# Patient Record
Sex: Female | Born: 1980
Health system: Southern US, Community
[De-identification: ages and names within clinical notes are randomized; demographics above are authoritative.]

## PROBLEM LIST (undated history)

## (undated) ENCOUNTER — Inpatient Hospital Stay (HOSPITAL_COMMUNITY): Payer: Self-pay

## (undated) DIAGNOSIS — B009 Herpesviral infection, unspecified: Secondary | ICD-10-CM

## (undated) DIAGNOSIS — I499 Cardiac arrhythmia, unspecified: Secondary | ICD-10-CM

## (undated) DIAGNOSIS — F419 Anxiety disorder, unspecified: Secondary | ICD-10-CM

## (undated) DIAGNOSIS — D649 Anemia, unspecified: Secondary | ICD-10-CM

## (undated) DIAGNOSIS — R51 Headache: Secondary | ICD-10-CM

## (undated) DIAGNOSIS — D219 Benign neoplasm of connective and other soft tissue, unspecified: Secondary | ICD-10-CM

## (undated) DIAGNOSIS — M1990 Unspecified osteoarthritis, unspecified site: Secondary | ICD-10-CM

## (undated) DIAGNOSIS — F329 Major depressive disorder, single episode, unspecified: Secondary | ICD-10-CM

## (undated) DIAGNOSIS — G43909 Migraine, unspecified, not intractable, without status migrainosus: Secondary | ICD-10-CM

## (undated) DIAGNOSIS — F32A Depression, unspecified: Secondary | ICD-10-CM

## (undated) HISTORY — DX: Benign neoplasm of connective and other soft tissue, unspecified: D21.9

## (undated) HISTORY — DX: Unspecified osteoarthritis, unspecified site: M19.90

## (undated) HISTORY — DX: Depression, unspecified: F32.A

## (undated) HISTORY — DX: Anemia, unspecified: D64.9

## (undated) HISTORY — PX: NO PAST SURGERIES: SHX2092

## (undated) HISTORY — PX: WISDOM TOOTH EXTRACTION: SHX21

## (undated) HISTORY — PX: OTHER SURGICAL HISTORY: SHX169

---
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
## 1999-08-16 ENCOUNTER — Encounter: Admission: RE | Admit: 1999-08-16 | Discharge: 1999-08-16 | Payer: Self-pay | Admitting: Family Medicine

## 1999-12-06 ENCOUNTER — Other Ambulatory Visit: Admission: RE | Admit: 1999-12-06 | Discharge: 1999-12-06 | Payer: Self-pay | Admitting: Obstetrics & Gynecology

## 1999-12-10 ENCOUNTER — Other Ambulatory Visit: Admission: RE | Admit: 1999-12-10 | Discharge: 1999-12-10 | Payer: Self-pay | Admitting: Obstetrics and Gynecology

## 2000-03-12 ENCOUNTER — Inpatient Hospital Stay (HOSPITAL_COMMUNITY): Admission: AD | Admit: 2000-03-12 | Discharge: 2000-03-12 | Payer: Self-pay | Admitting: Obstetrics and Gynecology

## 2000-07-26 ENCOUNTER — Inpatient Hospital Stay (HOSPITAL_COMMUNITY): Admission: AD | Admit: 2000-07-26 | Discharge: 2000-07-28 | Payer: Self-pay | Admitting: Obstetrics & Gynecology

## 2000-07-26 ENCOUNTER — Encounter: Payer: Self-pay | Admitting: Obstetrics & Gynecology

## 2000-08-13 ENCOUNTER — Inpatient Hospital Stay (HOSPITAL_COMMUNITY): Admission: AD | Admit: 2000-08-13 | Discharge: 2000-08-13 | Payer: Self-pay | Admitting: Obstetrics and Gynecology

## 2000-08-20 ENCOUNTER — Observation Stay (HOSPITAL_COMMUNITY): Admission: AD | Admit: 2000-08-20 | Discharge: 2000-08-21 | Payer: Self-pay | Admitting: Obstetrics & Gynecology

## 2000-08-23 ENCOUNTER — Inpatient Hospital Stay (HOSPITAL_COMMUNITY): Admission: AD | Admit: 2000-08-23 | Discharge: 2000-08-23 | Payer: Self-pay | Admitting: Obstetrics and Gynecology

## 2000-08-24 ENCOUNTER — Encounter: Payer: Self-pay | Admitting: Obstetrics and Gynecology

## 2000-09-03 ENCOUNTER — Inpatient Hospital Stay (HOSPITAL_COMMUNITY): Admission: AD | Admit: 2000-09-03 | Discharge: 2000-09-06 | Payer: Self-pay | Admitting: Obstetrics and Gynecology

## 2000-09-10 ENCOUNTER — Inpatient Hospital Stay (HOSPITAL_COMMUNITY): Admission: AD | Admit: 2000-09-10 | Discharge: 2000-09-10 | Payer: Self-pay | Admitting: Obstetrics and Gynecology

## 2000-10-03 ENCOUNTER — Inpatient Hospital Stay (HOSPITAL_COMMUNITY): Admission: AD | Admit: 2000-10-03 | Discharge: 2000-10-05 | Payer: Self-pay | Admitting: Obstetrics & Gynecology

## 2001-02-12 ENCOUNTER — Other Ambulatory Visit: Admission: RE | Admit: 2001-02-12 | Discharge: 2001-02-12 | Payer: Self-pay | Admitting: Obstetrics & Gynecology

## 2001-03-15 ENCOUNTER — Emergency Department (HOSPITAL_COMMUNITY): Admission: EM | Admit: 2001-03-15 | Discharge: 2001-03-15 | Payer: Self-pay | Admitting: Emergency Medicine

## 2001-05-05 ENCOUNTER — Inpatient Hospital Stay (HOSPITAL_COMMUNITY): Admission: AD | Admit: 2001-05-05 | Discharge: 2001-05-05 | Payer: Self-pay | Admitting: Obstetrics and Gynecology

## 2001-05-05 ENCOUNTER — Encounter: Payer: Self-pay | Admitting: Obstetrics and Gynecology

## 2001-07-13 ENCOUNTER — Inpatient Hospital Stay (HOSPITAL_COMMUNITY): Admission: EM | Admit: 2001-07-13 | Discharge: 2001-07-17 | Payer: Self-pay | Admitting: Psychiatry

## 2001-08-06 ENCOUNTER — Inpatient Hospital Stay (HOSPITAL_COMMUNITY): Admission: AD | Admit: 2001-08-06 | Discharge: 2001-08-06 | Payer: Self-pay | Admitting: Obstetrics & Gynecology

## 2001-08-07 ENCOUNTER — Encounter: Admission: RE | Admit: 2001-08-07 | Discharge: 2001-08-07 | Payer: Self-pay | Admitting: *Deleted

## 2001-09-01 ENCOUNTER — Inpatient Hospital Stay (HOSPITAL_COMMUNITY): Admission: AD | Admit: 2001-09-01 | Discharge: 2001-09-03 | Payer: Self-pay | Admitting: *Deleted

## 2001-11-15 ENCOUNTER — Emergency Department (HOSPITAL_COMMUNITY): Admission: EM | Admit: 2001-11-15 | Discharge: 2001-11-15 | Payer: Self-pay | Admitting: Emergency Medicine

## 2001-11-15 ENCOUNTER — Encounter: Payer: Self-pay | Admitting: Emergency Medicine

## 2002-08-22 ENCOUNTER — Other Ambulatory Visit: Admission: RE | Admit: 2002-08-22 | Discharge: 2002-08-22 | Payer: Self-pay | Admitting: Obstetrics and Gynecology

## 2002-08-23 ENCOUNTER — Other Ambulatory Visit: Admission: RE | Admit: 2002-08-23 | Discharge: 2002-08-23 | Payer: Self-pay | Admitting: Obstetrics and Gynecology

## 2002-08-26 ENCOUNTER — Emergency Department (HOSPITAL_COMMUNITY): Admission: EM | Admit: 2002-08-26 | Discharge: 2002-08-27 | Payer: Self-pay | Admitting: *Deleted

## 2002-08-27 ENCOUNTER — Encounter: Payer: Self-pay | Admitting: Emergency Medicine

## 2003-05-25 ENCOUNTER — Emergency Department (HOSPITAL_COMMUNITY): Admission: EM | Admit: 2003-05-25 | Discharge: 2003-05-25 | Payer: Self-pay | Admitting: Emergency Medicine

## 2003-07-11 ENCOUNTER — Emergency Department (HOSPITAL_COMMUNITY): Admission: AD | Admit: 2003-07-11 | Discharge: 2003-07-11 | Payer: Self-pay | Admitting: Family Medicine

## 2003-08-27 ENCOUNTER — Emergency Department (HOSPITAL_COMMUNITY): Admission: EM | Admit: 2003-08-27 | Discharge: 2003-08-27 | Payer: Self-pay | Admitting: Family Medicine

## 2003-12-11 ENCOUNTER — Emergency Department (HOSPITAL_COMMUNITY): Admission: EM | Admit: 2003-12-11 | Discharge: 2003-12-11 | Payer: Self-pay | Admitting: Family Medicine

## 2003-12-16 ENCOUNTER — Other Ambulatory Visit: Admission: RE | Admit: 2003-12-16 | Discharge: 2003-12-16 | Payer: Self-pay | Admitting: Obstetrics and Gynecology

## 2004-04-26 ENCOUNTER — Emergency Department (HOSPITAL_COMMUNITY): Admission: EM | Admit: 2004-04-26 | Discharge: 2004-04-26 | Payer: Self-pay | Admitting: Family Medicine

## 2004-06-10 ENCOUNTER — Emergency Department (HOSPITAL_COMMUNITY): Admission: EM | Admit: 2004-06-10 | Discharge: 2004-06-10 | Payer: Self-pay | Admitting: Family Medicine

## 2004-07-20 ENCOUNTER — Encounter: Admission: RE | Admit: 2004-07-20 | Discharge: 2004-07-20 | Payer: Self-pay | Admitting: Internal Medicine

## 2004-08-17 ENCOUNTER — Emergency Department (HOSPITAL_COMMUNITY): Admission: EM | Admit: 2004-08-17 | Discharge: 2004-08-18 | Payer: Self-pay | Admitting: Emergency Medicine

## 2004-08-17 ENCOUNTER — Emergency Department (HOSPITAL_COMMUNITY): Admission: EM | Admit: 2004-08-17 | Discharge: 2004-08-17 | Payer: Self-pay | Admitting: Family Medicine

## 2005-05-03 ENCOUNTER — Encounter: Admission: RE | Admit: 2005-05-03 | Discharge: 2005-05-03 | Payer: Self-pay | Admitting: Occupational Medicine

## 2005-06-21 ENCOUNTER — Other Ambulatory Visit: Admission: RE | Admit: 2005-06-21 | Discharge: 2005-06-21 | Payer: Self-pay | Admitting: Obstetrics and Gynecology

## 2005-07-31 ENCOUNTER — Emergency Department (HOSPITAL_COMMUNITY): Admission: EM | Admit: 2005-07-31 | Discharge: 2005-07-31 | Payer: Self-pay | Admitting: Family Medicine

## 2006-07-16 ENCOUNTER — Emergency Department (HOSPITAL_COMMUNITY): Admission: EM | Admit: 2006-07-16 | Discharge: 2006-07-16 | Payer: Self-pay | Admitting: Family Medicine

## 2006-11-23 ENCOUNTER — Emergency Department (HOSPITAL_COMMUNITY): Admission: EM | Admit: 2006-11-23 | Discharge: 2006-11-23 | Payer: Self-pay | Admitting: Emergency Medicine

## 2006-12-26 ENCOUNTER — Emergency Department (HOSPITAL_COMMUNITY): Admission: EM | Admit: 2006-12-26 | Discharge: 2006-12-26 | Payer: Self-pay | Admitting: Emergency Medicine

## 2006-12-31 ENCOUNTER — Emergency Department (HOSPITAL_COMMUNITY): Admission: EM | Admit: 2006-12-31 | Discharge: 2006-12-31 | Payer: Self-pay | Admitting: Family Medicine

## 2007-01-01 ENCOUNTER — Emergency Department (HOSPITAL_COMMUNITY): Admission: EM | Admit: 2007-01-01 | Discharge: 2007-01-01 | Payer: Self-pay | Admitting: Emergency Medicine

## 2007-01-08 ENCOUNTER — Emergency Department (HOSPITAL_COMMUNITY): Admission: EM | Admit: 2007-01-08 | Discharge: 2007-01-08 | Payer: Self-pay | Admitting: Emergency Medicine

## 2007-05-23 ENCOUNTER — Emergency Department (HOSPITAL_COMMUNITY): Admission: EM | Admit: 2007-05-23 | Discharge: 2007-05-23 | Payer: Self-pay | Admitting: Emergency Medicine

## 2007-08-03 ENCOUNTER — Emergency Department (HOSPITAL_COMMUNITY): Admission: EM | Admit: 2007-08-03 | Discharge: 2007-08-03 | Payer: Self-pay | Admitting: Emergency Medicine

## 2007-09-07 ENCOUNTER — Emergency Department (HOSPITAL_COMMUNITY): Admission: EM | Admit: 2007-09-07 | Discharge: 2007-09-07 | Payer: Self-pay | Admitting: Emergency Medicine

## 2008-01-05 ENCOUNTER — Emergency Department (HOSPITAL_COMMUNITY): Admission: EM | Admit: 2008-01-05 | Discharge: 2008-01-05 | Payer: Self-pay | Admitting: Emergency Medicine

## 2008-08-09 ENCOUNTER — Emergency Department (HOSPITAL_COMMUNITY): Admission: EM | Admit: 2008-08-09 | Discharge: 2008-08-09 | Payer: Self-pay | Admitting: Family Medicine

## 2008-08-31 ENCOUNTER — Emergency Department (HOSPITAL_COMMUNITY): Admission: EM | Admit: 2008-08-31 | Discharge: 2008-08-31 | Payer: Self-pay | Admitting: Emergency Medicine

## 2008-09-21 ENCOUNTER — Emergency Department (HOSPITAL_COMMUNITY): Admission: EM | Admit: 2008-09-21 | Discharge: 2008-09-21 | Payer: Self-pay | Admitting: Family Medicine

## 2008-11-13 ENCOUNTER — Emergency Department (HOSPITAL_COMMUNITY): Admission: EM | Admit: 2008-11-13 | Discharge: 2008-11-13 | Payer: Self-pay | Admitting: Emergency Medicine

## 2009-03-14 ENCOUNTER — Emergency Department (HOSPITAL_COMMUNITY): Admission: EM | Admit: 2009-03-14 | Discharge: 2009-03-14 | Payer: Self-pay | Admitting: Family Medicine

## 2009-05-23 ENCOUNTER — Ambulatory Visit: Payer: Self-pay | Admitting: Obstetrics and Gynecology

## 2009-05-23 ENCOUNTER — Inpatient Hospital Stay (HOSPITAL_COMMUNITY): Admission: AD | Admit: 2009-05-23 | Discharge: 2009-05-23 | Payer: Self-pay | Admitting: Obstetrics and Gynecology

## 2010-02-12 ENCOUNTER — Emergency Department (HOSPITAL_BASED_OUTPATIENT_CLINIC_OR_DEPARTMENT_OTHER): Admission: EM | Admit: 2010-02-12 | Discharge: 2010-02-12 | Payer: Self-pay | Admitting: Emergency Medicine

## 2010-03-02 ENCOUNTER — Ambulatory Visit: Payer: Self-pay | Admitting: Radiology

## 2010-03-02 ENCOUNTER — Emergency Department (HOSPITAL_BASED_OUTPATIENT_CLINIC_OR_DEPARTMENT_OTHER): Admission: EM | Admit: 2010-03-02 | Discharge: 2010-03-02 | Payer: Self-pay | Admitting: Emergency Medicine

## 2010-05-07 ENCOUNTER — Emergency Department (HOSPITAL_BASED_OUTPATIENT_CLINIC_OR_DEPARTMENT_OTHER)
Admission: EM | Admit: 2010-05-07 | Discharge: 2010-05-07 | Payer: Self-pay | Source: Home / Self Care | Admitting: Emergency Medicine

## 2010-05-17 LAB — URINALYSIS, ROUTINE W REFLEX MICROSCOPIC
Bilirubin Urine: NEGATIVE
Ketones, ur: NEGATIVE mg/dL
Nitrite: NEGATIVE
Protein, ur: NEGATIVE mg/dL
Specific Gravity, Urine: 1.027 (ref 1.005–1.030)
Urine Glucose, Fasting: NEGATIVE mg/dL
Urobilinogen, UA: 1 mg/dL (ref 0.0–1.0)
pH: 7 (ref 5.0–8.0)

## 2010-05-17 LAB — URINE CULTURE
Colony Count: >=100000
Culture  Setup Time: 201201070218

## 2010-05-17 LAB — URINE MICROSCOPIC-ADD ON

## 2010-05-17 LAB — DIFFERENTIAL
Basophils Absolute: 0 10*3/uL (ref 0.0–0.1)
Basophils Relative: 0 % (ref 0–1)
Eosinophils Absolute: 0.5 10*3/uL (ref 0.0–0.7)
Eosinophils Relative: 5 % (ref 0–5)
Lymphocytes Relative: 32 % (ref 12–46)
Lymphs Abs: 3.2 10*3/uL (ref 0.7–4.0)
Monocytes Absolute: 1.1 10*3/uL — ABNORMAL HIGH (ref 0.1–1.0)
Monocytes Relative: 11 % (ref 3–12)
Neutro Abs: 5.1 10*3/uL (ref 1.7–7.7)
Neutrophils Relative %: 52 % (ref 43–77)

## 2010-05-17 LAB — CBC
HCT: 34.2 % — ABNORMAL LOW (ref 36.0–46.0)
Hemoglobin: 11.1 g/dL — ABNORMAL LOW (ref 12.0–15.0)
MCH: 24 pg — ABNORMAL LOW (ref 26.0–34.0)
MCHC: 32.5 g/dL (ref 30.0–36.0)
MCV: 73.9 fL — ABNORMAL LOW (ref 78.0–100.0)
Platelets: 304 10*3/uL (ref 150–400)
RBC: 4.63 MIL/uL (ref 3.87–5.11)
RDW: 15.3 % (ref 11.5–15.5)
WBC: 9.9 10*3/uL (ref 4.0–10.5)

## 2010-05-17 LAB — BASIC METABOLIC PANEL
BUN: 11 mg/dL (ref 6–23)
CO2: 21 mEq/L (ref 19–32)
Calcium: 8.9 mg/dL (ref 8.4–10.5)
Chloride: 110 mEq/L (ref 96–112)
Creatinine, Ser: 0.6 mg/dL (ref 0.4–1.2)
GFR calc Af Amer: >60 mL/min (ref >60)
GFR calc non Af Amer: >60 mL/min (ref >60)
Glucose, Bld: 83 mg/dL (ref 70–99)
Potassium: 4 mEq/L (ref 3.5–5.1)
Sodium: 142 mEq/L (ref 135–145)

## 2010-07-13 LAB — ABO/RH: ABO/RH(D): O POS

## 2010-07-13 LAB — BASIC METABOLIC PANEL
BUN: 11 mg/dL (ref 6–23)
CO2: 20 mEq/L (ref 19–32)
Calcium: 9.7 mg/dL (ref 8.4–10.5)
Chloride: 107 mEq/L (ref 96–112)
Creatinine, Ser: 0.6 mg/dL (ref 0.4–1.2)
GFR calc Af Amer: >60 mL/min (ref >60)
GFR calc non Af Amer: >60 mL/min (ref >60)
Glucose, Bld: 79 mg/dL (ref 70–99)
Potassium: 5.5 mEq/L — ABNORMAL HIGH (ref 3.5–5.1)
Sodium: 142 mEq/L (ref 135–145)

## 2010-07-13 LAB — DIFFERENTIAL
Basophils Absolute: 0.4 10*3/uL — ABNORMAL HIGH (ref 0.0–0.1)
Basophils Relative: 4 % — ABNORMAL HIGH (ref 0–1)
Eosinophils Absolute: 0.4 10*3/uL (ref 0.0–0.7)
Eosinophils Relative: 4 % (ref 0–5)
Lymphocytes Relative: 31 % (ref 12–46)
Lymphs Abs: 2.8 10*3/uL (ref 0.7–4.0)
Monocytes Absolute: 0.6 10*3/uL (ref 0.1–1.0)
Monocytes Relative: 7 % (ref 3–12)
Neutro Abs: 5 10*3/uL (ref 1.7–7.7)
Neutrophils Relative %: 54 % (ref 43–77)

## 2010-07-13 LAB — CBC
HCT: 44 % (ref 36.0–46.0)
Hemoglobin: 14.4 g/dL (ref 12.0–15.0)
MCH: 25.5 pg — ABNORMAL LOW (ref 26.0–34.0)
MCHC: 32.7 g/dL (ref 30.0–36.0)
MCV: 78.2 fL (ref 78.0–100.0)
Platelets: 264 10*3/uL (ref 150–400)
RBC: 5.63 MIL/uL — ABNORMAL HIGH (ref 3.87–5.11)
RDW: 15.5 % (ref 11.5–15.5)
WBC: 9.2 10*3/uL (ref 4.0–10.5)

## 2010-07-13 LAB — HCG, QUANTITATIVE, PREGNANCY: hCG, Beta Chain, Quant, S: 577 m[IU]/mL — ABNORMAL HIGH (ref <5)

## 2010-07-13 LAB — PREGNANCY, URINE: Preg Test, Ur: POSITIVE

## 2010-07-13 LAB — WET PREP, GENITAL

## 2010-07-18 LAB — CBC
HCT: 37.7 % (ref 36.0–46.0)
Hemoglobin: 12.2 g/dL (ref 12.0–15.0)
MCV: 77.9 fL — ABNORMAL LOW (ref 78.0–100.0)
Platelets: 285 10*3/uL (ref 150–400)
WBC: 8.1 10*3/uL (ref 4.0–10.5)

## 2010-08-08 LAB — POCT URINALYSIS DIP (DEVICE)
Bilirubin Urine: NEGATIVE
Glucose, UA: NEGATIVE mg/dL
Ketones, ur: NEGATIVE mg/dL
Specific Gravity, Urine: 1.015 (ref 1.005–1.030)
Urobilinogen, UA: 0.2 mg/dL (ref 0.0–1.0)

## 2010-09-17 NOTE — Discharge Summary (Signed)
[facility]  Patient:    Maria Ho, Maria Ho Visit Number: [id number] MRN: [id number]          Service Type: OBS Location: [facility] Attending Physician:  Mickle Mallory Dictated by:   Jeanice Lim, M.D. Admit Date:  08/06/2001 Discharge Date: 08/06/2001                             Discharge Summary  IDENTIFYING DATA:  This is a 30 year old female single, voluntarily admitted, presenting after an argument with the boyfriend, seven months pregnant, wanting to stab self in the abdomen and kill baby.  MEDICATIONS:  Prenatal vitamins and propoxyphene for headache.  ALLERGIES:  No known drug allergies.  PHYSICAL EXAMINATION:  Unremarkable.  Neurologically nonfocal.  LABORATORY DATA:  Routine admission labs showed mild anemia, hemoglobin 10 and hematocrit 31.2.  Pulse oximetry was 97%.  CMET within normal limits.  Alcohol level negative.  MENTAL STATUS EXAMINATION:  Tall, healthy-appearing female, obviously pregnant and in no acute distress.  Speech within normal limits.  Mood depressed, apathetic and hopeless.  Thought process with passive suicidality, having thoughts to stab self.  No other psychotic symptoms.  Cognitively intact.  ADMISSION DIAGNOSES: Axis I:    1. Major depression, recurrent, severe.            2. Cannabis abuse. Axis II:   None. Axis III:  1. Thirty-week pregnancy.            2. History of premature labor. Axis IV:   Moderate (problems with primary support and problems related to            legal system). Axis V:    28/60.  HOSPITAL COURSE:  The patient was admitted and ordered routine p.r.n. medications.  Monitored for safety.  Ordered individual, group and milieu therapy.  Risk/benefit ratio and alternative treatments regarding medications were discussed.  The patient agreed the severity of depression required treatment and was given Paxil, which was tolerated well.  CONDITION ON DISCHARGE:  Improved.  Her mood was  more stable.  Affect brighter.  Thought process goal directed.  Thought content negative for dangerous ideation or psychotic symptoms.  The patient no longer felt like stabbing herself, felt more hopeful that she could cope with the stress of the pregnancy.  DISCHARGE MEDICATIONS: 1. Citrucel packet. 2. Prenatal vitamins. 3. Paxil CR 25 mg q.a.m. 4. Ambien 10 mg q.h.s. p.r.n. insomnia.  FOLLOW-UP:  The patient was to follow up with her OB/GYN, Dr. Quintella Reichert, Thursday, July 19, 2001 at 2:15 p.m. and Dr. Lourdes Sledge on August 07, 2001 at 2 p.m.  The patient was to take lab results to Dr. Quintella Reichert and Dr. Edward Jolly and have rapid heart rate and thyroid followed up.  She already had an appointment with her OB/GYN at 11:15 a.m. on July 19, 2001.  DISCHARGE DIAGNOSES: Axis I:    1. Major depression, recurrent, severe.            2. Cannabis abuse. Axis II:   None. Axis III:  1. Thirty-week pregnancy.            2. History of premature labor. Axis IV:   Moderate (problems with primary support and problems related to            legal system). Axis V:    Global Assessment of Functioning on discharge 60. Dictated by:   Jeanice Lim, M.D. Attending Physician:  Mickle Mallory  DD:  08/15/01 TD:  08/17/01 Job: [id number]

## 2010-09-17 NOTE — Discharge Summary (Signed)
[facility]  Patient:    Maria Ho, Maria Ho                    MRN: [id number] Adm. Date:  [id number] Disc. Date: [id number] Attending:  Conley Simmonds A                           Discharge Summary  ADMISSION DIAGNOSES:          1. Intrauterine pregnancy at 28+[redacted] weeks                                  gestation.                               2. Preterm labor.  DISCHARGE DIAGNOSES:          1. A 28+[redacted] week gestation.                               2. Preterm labor, resolved.                               3. Undelivered.  SIGNIFICANT HISTORY AND PHYSICAL EXAMINATION:     The patient was a 30 year old gravida 6, para 1-0-4-1 African-American female at 28+[redacted] weeks gestation with uterine contractions every two to three minutes.  Upon presentation to the [facility], the patient was noted to be afebrile and with a heart rate ranging from 105 to 114. The cervix was noted to be 1-2 cm dilated externally, closed internally, with no evidence of effacement and a posterior position of the cervix. The fetal heart rate ranged from 140-160. There were contractions every two to three minutes. The patients white blood cell count was 9.6, and her hemoglobin was 9.8.  The patient was treated with IV fluids, and she continued to contract. A decision was made to proceed at this time with admission for preterm labor. The patient was treated with magnesium sulfate for tocolysis. Unasyn was begun intravenously for antibiotic prophylaxis. The patient did have gonorrhea, Chlamydia, and group B strep cultures performed, and all these returned negative.  The patient had successful resolution of her contractions with the magnesium sulfate, and this was discontinued on July 27, 2000. She was started on Procardia 10 mg p.o. t.i.d. at this time.  The patient did have some headache during her hospitalization which was treated with Fioricet p.r.n.  The patient was found to be  stable and in good condition for discharge on July 28, 2000. Her cervical exam was unchanged.  The patient was discharged to home in good condition.  DISCHARGE INSTRUCTIONS:       Instructions at discharge include a prescription for Procardia 10 mg p.o. t.i.d. The patient will continue on her prenatal vitamin. The patient will have modified bed rest at home and pelvic rest. The patient will follow up in the office later that week. She will call if she experiences problems with contractions, leaking, bleeding, decreased fetal movement, or any other concerns. DD:  09/14/00 TD:  09/15/00 Job: [id number]

## 2010-12-19 ENCOUNTER — Ambulatory Visit (INDEPENDENT_AMBULATORY_CARE_PROVIDER_SITE_OTHER): Payer: Medicaid Other

## 2010-12-19 ENCOUNTER — Inpatient Hospital Stay (INDEPENDENT_AMBULATORY_CARE_PROVIDER_SITE_OTHER)
Admission: RE | Admit: 2010-12-19 | Discharge: 2010-12-19 | Disposition: A | Discharge status: Home / Self Care | Payer: Medicaid Other | Source: Ambulatory Visit | Attending: Family Medicine | Admitting: Family Medicine

## 2010-12-19 DIAGNOSIS — M799 Soft tissue disorder, unspecified: Secondary | ICD-10-CM

## 2011-01-01 ENCOUNTER — Inpatient Hospital Stay (INDEPENDENT_AMBULATORY_CARE_PROVIDER_SITE_OTHER)
Admission: RE | Admit: 2011-01-01 | Discharge: 2011-01-01 | Disposition: A | Discharge status: Home / Self Care | Payer: Medicaid Other | Source: Ambulatory Visit | Attending: Family Medicine | Admitting: Family Medicine

## 2011-01-01 DIAGNOSIS — T7840XA Allergy, unspecified, initial encounter: Secondary | ICD-10-CM

## 2011-01-25 LAB — URINALYSIS, ROUTINE W REFLEX MICROSCOPIC
Glucose, UA: NEGATIVE
Nitrite: NEGATIVE
Specific Gravity, Urine: 1.012
pH: 6.5

## 2011-01-25 LAB — CBC
MCHC: 33.1
MCV: 80.9
RDW: 13.3

## 2011-01-25 LAB — PROTIME-INR
INR: 1
Prothrombin Time: 13.3

## 2011-01-25 LAB — COMPREHENSIVE METABOLIC PANEL
ALT: 15
AST: 17
Calcium: 9.2
Creatinine, Ser: 0.64
GFR calc Af Amer: >60
GFR calc non Af Amer: >60
Glucose, Bld: 93
Sodium: 140
Total Protein: 6.9

## 2011-01-25 LAB — DIFFERENTIAL
Eosinophils Absolute: 0.1
Lymphocytes Relative: 16
Lymphs Abs: 1.6
Monocytes Relative: 6
Neutrophils Relative %: 76

## 2011-01-25 LAB — URINE MICROSCOPIC-ADD ON

## 2011-02-02 LAB — DIFFERENTIAL
Basophils Absolute: 0
Basophils Relative: 1
Eosinophils Absolute: 0.3
Monocytes Relative: 10
Neutro Abs: 2.6
Neutrophils Relative %: 51

## 2011-02-02 LAB — URINALYSIS, ROUTINE W REFLEX MICROSCOPIC
Glucose, UA: NEGATIVE
Ketones, ur: NEGATIVE
Leukocytes, UA: NEGATIVE
Nitrite: NEGATIVE
Protein, ur: NEGATIVE
Urobilinogen, UA: 0.2

## 2011-02-02 LAB — BASIC METABOLIC PANEL
BUN: 8
Calcium: 8.9
Chloride: 111
Creatinine, Ser: 0.59
GFR calc Af Amer: >60

## 2011-02-02 LAB — CBC
MCHC: 32.7
MCV: 82.3
Platelets: 234
RDW: 13.7

## 2011-02-02 LAB — RPR: RPR Ser Ql: NONREACTIVE

## 2011-02-02 LAB — PREGNANCY, URINE: Preg Test, Ur: NEGATIVE

## 2011-02-02 LAB — WET PREP, GENITAL: Yeast Wet Prep HPF POC: NONE SEEN

## 2011-02-02 LAB — GC/CHLAMYDIA PROBE AMP, GENITAL
Chlamydia, DNA Probe: NEGATIVE
GC Probe Amp, Genital: NEGATIVE

## 2011-02-02 LAB — URINE MICROSCOPIC-ADD ON

## 2011-02-08 ENCOUNTER — Emergency Department (HOSPITAL_BASED_OUTPATIENT_CLINIC_OR_DEPARTMENT_OTHER)
Admission: EM | Admit: 2011-02-08 | Discharge: 2011-02-08 | Disposition: A | Discharge status: Home / Self Care | Payer: Medicaid Other | Attending: Emergency Medicine | Admitting: Emergency Medicine

## 2011-02-08 DIAGNOSIS — X500XXA Overexertion from strenuous movement or load, initial encounter: Secondary | ICD-10-CM | POA: Insufficient documentation

## 2011-02-08 DIAGNOSIS — S139XXA Sprain of joints and ligaments of unspecified parts of neck, initial encounter: Secondary | ICD-10-CM | POA: Insufficient documentation

## 2011-02-08 DIAGNOSIS — M549 Dorsalgia, unspecified: Secondary | ICD-10-CM | POA: Insufficient documentation

## 2011-02-08 DIAGNOSIS — G43909 Migraine, unspecified, not intractable, without status migrainosus: Secondary | ICD-10-CM | POA: Insufficient documentation

## 2011-02-08 DIAGNOSIS — S161XXA Strain of muscle, fascia and tendon at neck level, initial encounter: Secondary | ICD-10-CM

## 2011-02-08 HISTORY — DX: Anxiety disorder, unspecified: F41.9

## 2011-02-08 MED ORDER — HYDROMORPHONE HCL 1 MG/ML IJ SOLN
1.0000 mg | Freq: Once | INTRAMUSCULAR | Status: AC
  Administered 2011-02-08: 1 mg via INTRAMUSCULAR
  Filled 2011-02-08: qty 1

## 2011-02-08 MED ORDER — ETODOLAC 500 MG PO TABS: 500.0000 mg | ORAL_TABLET | Freq: Two times a day (BID) | ORAL | Status: DC

## 2011-02-08 MED ORDER — DIAZEPAM 5 MG PO TABS: 5.0000 mg | ORAL_TABLET | Freq: Three times a day (TID) | ORAL | Status: AC | PRN

## 2011-02-08 MED ORDER — ONDANSETRON 4 MG PO TBDP
4.0000 mg | ORAL_TABLET | Freq: Once | ORAL | Status: AC
  Administered 2011-02-08: 4 mg via ORAL
  Filled 2011-02-08: qty 1

## 2011-02-08 NOTE — ED Provider Notes (Addendum)
History     CSN: [id number] Arrival date & time: 02/08/2011  7:24 AM  Chief Complaint  Patient presents with  . Migraine  . Back Pain    (Consider location/radiation/quality/duration/timing/severity/associated sxs/prior treatment) HPI Comments: Patient states he developed neck spasms after moving her heavy backpack yesterday. Patient felt a muscle pull after occurred but initially was not that bad. Symptoms progressively worsened over the last day. Patient also states that she developed somewhat of a migraine headache although that is currently not that bad. Patient has history of migraines and she states that sometimes after taking hydrocodone it will trigger her migraine. She did take her hydrocodone yesterday.  Patient is a 30 y.o. female presenting with migraine and back pain. The history is provided by the patient.  Migraine This is a recurrent problem. The current episode started yesterday. The problem occurs constantly. The problem has not changed since onset.Associated symptoms include headaches. Pertinent negatives include no chest pain, no abdominal pain and no shortness of breath. Exacerbated by: Movement. The symptoms are relieved by nothing. Treatments tried: Hydrocodone tablet. The treatment provided mild relief.  Back Pain  Associated symptoms include headaches. Pertinent negatives include no chest pain, no fever, no numbness, no abdominal pain and no weakness.    Past Medical History  Diagnosis Date  . Anxiety     History reviewed. No pertinent past surgical history.  No family history on file.  History  Substance Use Topics  . Smoking status: Never Smoker   . Smokeless tobacco: Never Used  . Alcohol Use: Yes     wine occasionally    OB History    Grav Para Term Preterm Abortions TAB SAB Ect Mult Living                  Review of Systems  Constitutional: Negative for fever.  Respiratory: Negative for shortness of breath.   Cardiovascular: Negative for  chest pain.  Gastrointestinal: Negative for vomiting, abdominal pain and diarrhea.  Musculoskeletal: Positive for back pain.  Neurological: Positive for headaches. Negative for tremors, seizures, syncope, speech difficulty, weakness and numbness.  All other systems reviewed and are negative.    Allergies  Review of patient's allergies indicates no known allergies.  Home Medications   Current Outpatient Rx  Name Route Sig Dispense Refill  . GABAPENTIN 100 MG PO CAPS Oral Take 100 mg by mouth 3 (three) times daily.      Marland Kitchen PHENTERMINE HCL 37.5 MG PO CAPS Oral Take 37.5 mg by mouth every morning.        BP 129/90  Pulse 93  Temp(Src) 97.9 F (36.6 C) (Oral)  Resp 16  Ht 5\' 10"  (1.778 m)  Wt 195 lb (88.451 kg)  BMI 27.98 kg/m2  SpO2 100%  Physical Exam  Nursing note and vitals reviewed. Constitutional: She is oriented to person, place, and time. She appears well-developed and well-nourished. No distress.  HENT:  Head: Normocephalic and atraumatic.  Right Ear: External ear normal.  Left Ear: External ear normal.  Mouth/Throat: Oropharyngeal exudate present.  Eyes: Conjunctivae are normal. Right eye exhibits no discharge. Left eye exhibits no discharge. No scleral icterus.  Neck: Neck supple. No tracheal deviation present.       Paraspinal muscle tenderness left paracervical region, no meningismus, no erythema or masses  Cardiovascular: Normal rate, regular rhythm and intact distal pulses.   Pulmonary/Chest: Effort normal and breath sounds normal. No stridor. No respiratory distress. She has no wheezes. She has no rales.  Abdominal: Soft. Bowel sounds are normal. She exhibits no distension. There is no tenderness. There is no rebound and no guarding.  Musculoskeletal: She exhibits no edema and no tenderness.  Lymphadenopathy:    She has no cervical adenopathy.  Neurological: She is alert and oriented to person, place, and time. She has normal strength. No cranial nerve deficit  ( no gross defecits noted) or sensory deficit. She exhibits normal muscle tone. She displays no seizure activity. Coordination normal.       5 out of 5 strength bilateral upper jugular extremities, sensation intact in all extremities, no visual field cuts, no left or right sided neglect  Skin: Skin is warm and dry. No rash noted.  Psychiatric: She has a normal mood and affect.    ED Course  Procedures (including critical care time)  Labs Reviewed - No data to display No results found.   1. Cervical strain       MDM  Patient appears to have a muscle strain in her neck region. Her symptoms are not suggestive of meningitis. She does not have any fever. There are no focal neurological abnormalities. Patient does have history of migraines as well but this appears to be a minor component. Patient will be discharged home on muscle relaxants and anti-inflammatory pain medications        Celene Kras, MD 02/08/11 [id number]  Celene Kras, MD 02/08/11 [phone]

## 2011-02-08 NOTE — ED Notes (Signed)
Pt reports headache that started this am.  She reports injuring her upper back and neck yesterday after removing her backpack.

## 2011-02-14 LAB — DIFFERENTIAL
Basophils Absolute: 0
Basophils Relative: 0
Monocytes Relative: 3
Neutro Abs: 8.2 — ABNORMAL HIGH
Neutrophils Relative %: 90 — ABNORMAL HIGH

## 2011-02-14 LAB — POCT I-STAT CREATININE: Operator id: 239701

## 2011-02-14 LAB — POCT URINALYSIS DIP (DEVICE)
Glucose, UA: NEGATIVE
Nitrite: NEGATIVE
Operator id: 239701
Urobilinogen, UA: 0.2

## 2011-02-14 LAB — I-STAT 8, (EC8 V) (CONVERTED LAB)
Acid-base deficit: 2
Hemoglobin: 17.3 — ABNORMAL HIGH
Potassium: 5.4 — ABNORMAL HIGH
Sodium: 137
TCO2: 24

## 2011-02-14 LAB — POCT PREGNANCY, URINE
Operator id: 239701
Preg Test, Ur: NEGATIVE

## 2011-02-14 LAB — CBC
MCHC: 32.6
RBC: 5.58 — ABNORMAL HIGH
RDW: 14.2 — ABNORMAL HIGH

## 2011-02-14 LAB — URINE CULTURE: Colony Count: 10000

## 2011-02-14 LAB — URINALYSIS, ROUTINE W REFLEX MICROSCOPIC
Ketones, ur: >80 — AB
Nitrite: NEGATIVE
Urobilinogen, UA: 0.2

## 2011-04-07 ENCOUNTER — Encounter (HOSPITAL_COMMUNITY): Payer: Self-pay | Admitting: *Deleted

## 2011-04-07 ENCOUNTER — Inpatient Hospital Stay (HOSPITAL_COMMUNITY)
Admission: AD | Admit: 2011-04-07 | Discharge: 2011-04-07 | Disposition: A | Discharge status: Home / Self Care | Payer: Medicaid Other | Source: Ambulatory Visit | Attending: Family Medicine | Admitting: Family Medicine

## 2011-04-07 ENCOUNTER — Inpatient Hospital Stay (HOSPITAL_COMMUNITY): Payer: Medicaid Other

## 2011-04-07 DIAGNOSIS — B9689 Other specified bacterial agents as the cause of diseases classified elsewhere: Secondary | ICD-10-CM

## 2011-04-07 DIAGNOSIS — O209 Hemorrhage in early pregnancy, unspecified: Secondary | ICD-10-CM | POA: Insufficient documentation

## 2011-04-07 DIAGNOSIS — R109 Unspecified abdominal pain: Secondary | ICD-10-CM | POA: Insufficient documentation

## 2011-04-07 HISTORY — DX: Headache: R51

## 2011-04-07 LAB — URINE MICROSCOPIC-ADD ON

## 2011-04-07 LAB — HCG, QUANTITATIVE, PREGNANCY: hCG, Beta Chain, Quant, S: 1301 m[IU]/mL — ABNORMAL HIGH (ref <5)

## 2011-04-07 LAB — URINALYSIS, ROUTINE W REFLEX MICROSCOPIC
Bilirubin Urine: NEGATIVE
Protein, ur: NEGATIVE mg/dL
Specific Gravity, Urine: >1.03 — ABNORMAL HIGH (ref 1.005–1.030)
Urobilinogen, UA: 0.2 mg/dL (ref 0.0–1.0)

## 2011-04-07 LAB — WET PREP, GENITAL: Trich, Wet Prep: NONE SEEN

## 2011-04-07 LAB — CBC
Hemoglobin: 12.1 g/dL (ref 12.0–15.0)
MCHC: 31.5 g/dL (ref 30.0–36.0)
Platelets: 317 10*3/uL (ref 150–400)

## 2011-04-07 MED ORDER — METRONIDAZOLE 500 MG PO TABS: 500.0000 mg | ORAL_TABLET | Freq: Two times a day (BID) | ORAL | Status: AC

## 2011-04-07 NOTE — Progress Notes (Signed)
Pt LMP 02/28/2011, G11 P3, +UPt, having cramping x 2 days.  Denies bleeding.

## 2011-04-07 NOTE — ED Provider Notes (Signed)
History     Chief Complaint  Patient presents with  . Abdominal Cramping   HPI 30 y.o. [id number] at [redacted]w[redacted]d. Cramping started last night, spotting started tonight. No discharge.     Past Medical History  Diagnosis Date  . Anxiety   . Headache     No past surgical history on file.  No family history on file.  History  Substance Use Topics  . Smoking status: Never Smoker   . Smokeless tobacco: Never Used  . Alcohol Use: Yes     wine occasionally    Allergies: No Known Allergies  Prescriptions prior to admission  Medication Sig Dispense Refill  . acetaminophen (TYLENOL) 500 MG tablet Take 500 mg by mouth every 6 (six) hours as needed. Patient took this medication for a headache.       . gabapentin (NEURONTIN) 100 MG capsule Take 100 mg by mouth 3 (three) times daily.        Marland Kitchen ibuprofen (ADVIL,MOTRIN) 800 MG tablet Take 800 mg by mouth every 8 (eight) hours as needed. Patient took this medication for pain.       . phentermine 37.5 MG capsule Take 37.5 mg by mouth every morning.        . Pseudoeph-Doxylamine-DM-APAP (NYQUIL PO) Take 5 mLs by mouth daily as needed. Patient used this medication for sleep.         Review of Systems  Constitutional: Negative.   Respiratory: Negative.   Cardiovascular: Negative.   Gastrointestinal: Positive for abdominal pain. Negative for nausea, vomiting, diarrhea and constipation.  Genitourinary: Negative for dysuria, urgency, frequency, hematuria and flank pain.       Positive for vaginal bleeding  Musculoskeletal: Negative.   Neurological: Negative.   Psychiatric/Behavioral: Negative.    Physical Exam   Blood pressure 134/73, pulse 97, temperature 98.9 F (37.2 C), temperature source Oral, resp. rate 16, height 5\' 10"  (1.778 m), weight 191 lb 3.2 oz (86.728 kg), last menstrual period 02/28/2011.  Physical Exam  Nursing note and vitals reviewed. Constitutional: She is oriented to person, place, and time. She appears well-developed and  well-nourished. No distress.  Cardiovascular: Normal rate.   Respiratory: Effort normal.  GI: Soft. There is no tenderness.  Genitourinary: There is no tenderness or lesion on the right labia. There is no tenderness or lesion on the left labia. Uterus is not enlarged and not tender. Cervix exhibits no motion tenderness and no discharge. Right adnexum displays no tenderness and no fullness. Left adnexum displays no tenderness and no fullness. There is bleeding (scant pink) around the vagina.  Musculoskeletal: Normal range of motion.  Neurological: She is alert and oriented to person, place, and time.    MAU Course  Procedures  Results for orders placed during the hospital encounter of 04/07/11 (from the past 24 hour(s))  URINALYSIS, ROUTINE W REFLEX MICROSCOPIC     Status: Abnormal   Collection Time   04/07/11  8:40 PM      Component Value Range   Color, Urine YELLOW  YELLOW    APPearance CLEAR  CLEAR    Specific Gravity, Urine >1.030 (*) 1.005 - 1.030    pH 6.0  5.0 - 8.0    Glucose, UA NEGATIVE  NEGATIVE (mg/dL)   Hgb urine dipstick TRACE (*) NEGATIVE    Bilirubin Urine NEGATIVE  NEGATIVE    Ketones, ur 15 (*) NEGATIVE (mg/dL)   Protein, ur NEGATIVE  NEGATIVE (mg/dL)   Urobilinogen, UA 0.2  0.0 - 1.0 (mg/dL)  Nitrite NEGATIVE  NEGATIVE    Leukocytes, UA TRACE (*) NEGATIVE   URINE MICROSCOPIC-ADD ON     Status: Abnormal   Collection Time   04/07/11  8:40 PM      Component Value Range   Squamous Epithelial / LPF MANY (*) RARE    WBC, UA 0-2  <3 (WBC/hpf)   RBC / HPF 0-2  <3 (RBC/hpf)   Bacteria, UA FEW (*) RARE    Urine-Other MUCOUS PRESENT    POCT PREGNANCY, URINE     Status: Normal   Collection Time   04/07/11  8:46 PM      Component Value Range   Preg Test, Ur POSITIVE    CBC     Status: Abnormal   Collection Time   04/07/11  9:34 PM      Component Value Range   WBC 9.9  4.0 - 10.5 (K/uL)   RBC 4.85  3.87 - 5.11 (MIL/uL)   Hemoglobin 12.1  12.0 - 15.0 (g/dL)   HCT  86.5  78.4 - 69.6 (%)   MCV 79.2  78.0 - 100.0 (fL)   MCH 24.9 (*) 26.0 - 34.0 (pg)   MCHC 31.5  30.0 - 36.0 (g/dL)   RDW 29.5  28.4 - 13.2 (%)   Platelets 317  150 - 400 (K/uL)  HCG, QUANTITATIVE, PREGNANCY     Status: Abnormal   Collection Time   04/07/11  9:34 PM      Component Value Range   hCG, Beta Chain, Quant, S 1301 (*) <5 (mIU/mL)  WET PREP, GENITAL     Status: Abnormal   Collection Time   04/07/11  9:45 PM      Component Value Range   Yeast, Wet Prep NONE SEEN  NONE SEEN    Trich, Wet Prep NONE SEEN  NONE SEEN    Clue Cells, Wet Prep MODERATE (*) NONE SEEN    WBC, Wet Prep HPF POC MODERATE (*) NONE SEEN     US Ob Comp Less 14 Wks  04/07/2011  *RADIOLOGY REPORT*  Clinical Data: First trimester pregnancy with pain and spotting. LMP 02/28/2011.  Beta HCG level 1301.  OBSTETRIC <14 WK [place] AND TRANSVAGINAL OB US  Technique:  Both transabdominal and transvaginal ultrasound examinations were performed for complete evaluation of the gestation as well as the maternal uterus, adnexal regions, and pelvic cul-de-sac.  Transvaginal technique was performed to assess early pregnancy.  Comparison:   Obstetrical ultrasound 03/02/2010.  Intrauterine gestational sac:   There is a small cyst in the lower uterine segment with a mean sac diameter of 3.3 mm.  If this is a gestational sac, it corresponds with a gestational age of [redacted] weeks 6 days. Yolk sac: None visualized. Embryo: None visualized.  Maternal uterus/adnexae: Again demonstrated is a large heterogeneous uterine fibroid measuring 8.1 x 7.6 x 5.5 cm.  Both maternal ovaries are visualized.  There is a probable small corpus luteal cyst on the right measuring 2.1 cm.  No adnexal mass or free pelvic fluid demonstrated.  IMPRESSION:  1.  Possible small gestational sac in the lower uterine segment with a mean sac diameter 3.3 mm, corresponding with a gestational age of [redacted] weeks 6 days.  No fetal pole is demonstrated.  A fetal pole is not necessarily  visualized this early in pregnancy, and the findings may be due to early gestation. 2.  Ectopic pregnancy is not excluded.  Follow-up beta HCG levels and consideration of follow-up ultrasound in 7-10 days are  recommended to assess fetal viability. 3.  Large uterine fibroid.  Original Report Authenticated By: Gerrianne Scale, M.D.   US Ob Transvaginal  04/07/2011  *RADIOLOGY REPORT*  Clinical Data: First trimester pregnancy with pain and spotting. LMP 02/28/2011.  Beta HCG level 1301.  OBSTETRIC <14 WK [place] AND TRANSVAGINAL OB US  Technique:  Both transabdominal and transvaginal ultrasound examinations were performed for complete evaluation of the gestation as well as the maternal uterus, adnexal regions, and pelvic cul-de-sac.  Transvaginal technique was performed to assess early pregnancy.  Comparison:   Obstetrical ultrasound 03/02/2010.  Intrauterine gestational sac:   There is a small cyst in the lower uterine segment with a mean sac diameter of 3.3 mm.  If this is a gestational sac, it corresponds with a gestational age of [redacted] weeks 6 days. Yolk sac: None visualized. Embryo: None visualized.  Maternal uterus/adnexae: Again demonstrated is a large heterogeneous uterine fibroid measuring 8.1 x 7.6 x 5.5 cm.  Both maternal ovaries are visualized.  There is a probable small corpus luteal cyst on the right measuring 2.1 cm.  No adnexal mass or free pelvic fluid demonstrated.  IMPRESSION:  1.  Possible small gestational sac in the lower uterine segment with a mean sac diameter 3.3 mm, corresponding with a gestational age of [redacted] weeks 6 days.  No fetal pole is demonstrated.  A fetal pole is not necessarily visualized this early in pregnancy, and the findings may be due to early gestation. 2.  Ectopic pregnancy is not excluded.  Follow-up beta HCG levels and consideration of follow-up ultrasound in 7-10 days are recommended to assess fetal viability. 3.  Large uterine fibroid.  Original Report Authenticated By: Gerrianne Scale, M.D.    Assessment and Plan  30 y.o. [id number] at [redacted]w[redacted]d with cramping and bleeding in early pregnancy F/U in 48 hours for repeat quant Precautions rev'd  Taysen Bushart 04/07/2011, 9:06 PM

## 2011-04-07 NOTE — Progress Notes (Signed)
Pt reports cramping since last night. Pt also reports that she had some pink vaginal discharge in MAU restroom.

## 2011-04-08 LAB — GC/CHLAMYDIA PROBE AMP, GENITAL: GC Probe Amp, Genital: NEGATIVE

## 2011-04-08 NOTE — ED Provider Notes (Signed)
Chart reviewed and agree with management and plan.  

## 2011-04-10 ENCOUNTER — Inpatient Hospital Stay (HOSPITAL_COMMUNITY)
Admission: AD | Admit: 2011-04-10 | Discharge: 2011-04-10 | Disposition: A | Discharge status: Home / Self Care | Payer: Medicaid Other | Source: Ambulatory Visit | Attending: Family Medicine | Admitting: Family Medicine

## 2011-04-10 DIAGNOSIS — O209 Hemorrhage in early pregnancy, unspecified: Secondary | ICD-10-CM | POA: Insufficient documentation

## 2011-04-10 LAB — HCG, QUANTITATIVE, PREGNANCY: hCG, Beta Chain, Quant, S: 3488 m[IU]/mL — ABNORMAL HIGH (ref <5)

## 2011-04-10 NOTE — Progress Notes (Signed)
Patient was told to come back for repeat BHCG, no pain no bleeding.

## 2011-04-10 NOTE — ED Provider Notes (Signed)
History   Pt presents today for repeat B-quant secondary to bleeding in preg. She states her bleeding has now stopped and she has no pain or problems.  Chief Complaint  Patient presents with  . Possible Pregnancy   HPI  OB History    Grav Para Term Preterm Abortions TAB SAB Ect Mult Living   11 3 3  7 5 2   2       Past Medical History  Diagnosis Date  . Anxiety   . Headache     No past surgical history on file.  No family history on file.  History  Substance Use Topics  . Smoking status: Never Smoker   . Smokeless tobacco: Never Used  . Alcohol Use: Yes     wine occasionally    Allergies: No Known Allergies  Prescriptions prior to admission  Medication Sig Dispense Refill  . acetaminophen (TYLENOL) 500 MG tablet Take 500 mg by mouth every 6 (six) hours as needed. Patient took this medication for a headache.       . gabapentin (NEURONTIN) 100 MG capsule Take 100 mg by mouth 3 (three) times daily.        . metroNIDAZOLE (FLAGYL) 500 MG tablet Take 1 tablet (500 mg total) by mouth 2 (two) times daily.  14 tablet  0  . Pseudoeph-Doxylamine-DM-APAP (NYQUIL PO) Take 5 mLs by mouth daily as needed. Patient used this medication for sleep.         Review of Systems  Constitutional: Negative for fever.  Cardiovascular: Negative for chest pain.  Gastrointestinal: Negative for nausea, vomiting, abdominal pain, diarrhea and constipation.  Genitourinary: Negative for dysuria, urgency, frequency and hematuria.  Neurological: Negative for dizziness and headaches.  Psychiatric/Behavioral: Negative for depression and suicidal ideas.   Physical Exam   Blood pressure 123/77, pulse 94, temperature 99.4 F (37.4 C), temperature source Oral, resp. rate 16, last menstrual period 02/28/2011.  Physical Exam  Nursing note and vitals reviewed. Constitutional: She is oriented to person, place, and time. She appears well-developed and well-nourished. No distress.  HENT:  Head:  Normocephalic and atraumatic.  Eyes: EOM are normal. Pupils are equal, round, and reactive to light.  GI: Soft. She exhibits no distension. There is no tenderness. There is no rebound and no guarding.  Neurological: She is alert and oriented to person, place, and time.  Skin: Skin is warm and dry. She is not diaphoretic.  Psychiatric: She has a normal mood and affect. Her behavior is normal. Judgment and thought content normal.    MAU Course  Procedures  Results for orders placed during the hospital encounter of 04/10/11 (from the past 24 hour(s))  HCG, QUANTITATIVE, PREGNANCY     Status: Abnormal   Collection Time   04/10/11  2:58 PM      Component Value Range   hCG, Beta Chain, Quant, S 3488 (*) <5 (mIU/mL)     Assessment and Plan  Vag bleeding in preg: pt has had appropriate rise in B-hcg. She will return on 04/17/11 for repeat ultrasound. Discussed diet, activity, risks, and precautions.  Clinton Gallant. Everet Flagg III, DrHSc, MPAS, PA-C  04/10/2011, 3:42 PM   Henrietta Hoover, PA 04/10/11 1545

## 2011-04-11 NOTE — ED Provider Notes (Signed)
Chart reviewed and agree with management and plan.  

## 2011-04-17 ENCOUNTER — Encounter (HOSPITAL_COMMUNITY): Payer: Self-pay

## 2011-04-17 ENCOUNTER — Inpatient Hospital Stay (HOSPITAL_COMMUNITY)
Admission: AD | Admit: 2011-04-17 | Discharge: 2011-04-17 | Disposition: A | Discharge status: Home / Self Care | Payer: Medicaid Other | Source: Ambulatory Visit | Attending: Family Medicine | Admitting: Family Medicine

## 2011-04-17 ENCOUNTER — Inpatient Hospital Stay (HOSPITAL_COMMUNITY): Payer: Medicaid Other

## 2011-04-17 DIAGNOSIS — O209 Hemorrhage in early pregnancy, unspecified: Secondary | ICD-10-CM

## 2011-04-17 MED ORDER — CONCEPT OB 130-92.4-1 MG PO CAPS: 1.0000 | ORAL_CAPSULE | Freq: Every day | ORAL | Status: DC

## 2011-04-17 NOTE — ED Provider Notes (Signed)
History   Chief Complaint:  Vaginal Bleeding   Maria Ho is  30 y.o. [id number] at [redacted]w[redacted]d by LMP. Patient's last menstrual period was 02/28/2011.Marland Kitchen Patient is here for follow up ultrasound after being seen in MAU for spotting and cramping 04/07/11 and 04/10/11. She had appropriately rising quants and US showed a possible 4.6 week GS on 04/07/11.    Since her last visit, the patient is without new complaint.   The patient reports bleeding as  Light pink.    General ROS:  negative  Her previous Quantitative HCG values are:  Results for Maria Ho (MRN [id number]) as of 04/21/2011 20:18  Ref. Range 04/07/2011 21:34 04/10/2011 14:58  hCG, Beta Chain, Quant, S Latest Range: <5 mIU/mL 1301 (H) 3488 (H)   O POS   Physical Exam   Blood pressure 115/71, pulse 95, temperature 98.9 F (37.2 C), temperature source Oral, resp. rate 16, last menstrual period 02/28/2011.  Focused Gynecological Exam: normal external genitalia, vulva, vagina, cervix, uterus and adnexa except for scant pink odorless discharge  Labs: No results found for this or any previous visit (from the past 24 hour(s)).  Ultrasound Studies:     Assessment: [redacted]w[redacted]d weeks gestation  Plan: Start [facility] Rx PNV  [place], [place] 04/17/2011, 10:39 AM

## 2011-04-17 NOTE — Progress Notes (Signed)
Had a couple of episodes of pink discharge on tissue this past week none today, no pain, here for f/u ultrasound.

## 2011-04-21 ENCOUNTER — Encounter (HOSPITAL_COMMUNITY): Payer: Self-pay | Admitting: Advanced Practice Midwife

## 2011-04-26 NOTE — ED Provider Notes (Signed)
Chart reviewed and agree with management and plan.  

## 2011-04-29 ENCOUNTER — Emergency Department (HOSPITAL_COMMUNITY)
Admission: EM | Admit: 2011-04-29 | Discharge: 2011-04-29 | Disposition: A | Discharge status: Home / Self Care | Payer: Medicaid Other | Source: Home / Self Care | Attending: Emergency Medicine | Admitting: Emergency Medicine

## 2011-04-29 ENCOUNTER — Encounter (HOSPITAL_COMMUNITY): Payer: Self-pay

## 2011-04-29 DIAGNOSIS — J039 Acute tonsillitis, unspecified: Secondary | ICD-10-CM

## 2011-04-29 LAB — POCT RAPID STREP A: Streptococcus, Group A Screen (Direct): NEGATIVE

## 2011-04-29 MED ORDER — FLUCONAZOLE 200 MG PO TABS: 200.0000 mg | ORAL_TABLET | Freq: Every day | ORAL | Status: AC

## 2011-04-29 MED ORDER — AMOXICILLIN 250 MG PO CAPS: 250.0000 mg | ORAL_CAPSULE | Freq: Three times a day (TID) | ORAL | Status: AC

## 2011-04-29 NOTE — ED Provider Notes (Signed)
History     CSN: [id number]  Arrival date & time 04/29/11  1933   First MD Initiated Contact with Patient 04/29/11 1945      Chief Complaint  Patient presents with  . Sore Throat    (Consider location/radiation/quality/duration/timing/severity/associated sxs/prior treatment) HPI Comments: Maria Ho has had a two-day history of a severe sore throat, pain with swallowing, swollen, tender glands, malaise, and fatigue. Her son had strep last week. She also has some postnasal drip. She is [redacted] weeks pregnant and so far has had no problems. She has had strep before.  Patient is a 30 y.o. female presenting with pharyngitis.  Sore Throat Pertinent negatives include no abdominal pain and no shortness of breath.    Past Medical History  Diagnosis Date  . Anxiety   . Headache     History reviewed. No pertinent past surgical history.  History reviewed. No pertinent family history.  History  Substance Use Topics  . Smoking status: Never Smoker   . Smokeless tobacco: Never Used  . Alcohol Use: No     wine occasionally    OB History    Grav Para Term Preterm Abortions TAB SAB Ect Mult Living   11 3 3  7 5 2   2       Review of Systems  Constitutional: Negative for fever, chills and fatigue.  HENT: Positive for sore throat and trouble swallowing. Negative for ear pain, congestion, rhinorrhea, sneezing, neck stiffness, voice change and postnasal drip.   Eyes: Negative for pain, discharge and redness.  Respiratory: Negative for cough, chest tightness, shortness of breath and wheezing.   Gastrointestinal: Negative for nausea, vomiting, abdominal pain and diarrhea.  Skin: Negative for rash.    Allergies  Review of patient's allergies indicates no known allergies.  Home Medications   Current Outpatient Rx  Name Route Sig Dispense Refill  . CONCEPT OB 130-92.4-1 MG PO CAPS Oral Take 1 tablet by mouth daily. 30 capsule 12  . ACETAMINOPHEN 500 MG PO TABS Oral Take 500 mg by mouth  every 6 (six) hours as needed. Patient took this medication for a headache.     . AMOXICILLIN 250 MG PO CAPS Oral Take 1 capsule (250 mg total) by mouth 3 (three) times daily. 30 capsule 0  . FLUCONAZOLE 200 MG PO TABS Oral Take 1 tablet (200 mg total) by mouth daily. 7 tablet 0    BP 127/78  Pulse 105  Temp(Src) 98.9 F (37.2 C) (Oral)  Resp 16  SpO2 98%  LMP 02/28/2011  Physical Exam  Nursing note and vitals reviewed. Constitutional: She appears well-developed and well-nourished. No distress.  HENT:  Head: Normocephalic and atraumatic.  Right Ear: External ear normal.  Left Ear: External ear normal.  Nose: Nose normal.  Mouth/Throat: Oropharynx is clear and moist. No oropharyngeal exudate (tonsils were enlarged and red without exudate).  Eyes: Conjunctivae and EOM are normal. Pupils are equal, round, and reactive to light. Right eye exhibits no discharge. Left eye exhibits no discharge.  Neck: Normal range of motion. Neck supple.  Cardiovascular: Normal rate, regular rhythm and normal heart sounds.   Pulmonary/Chest: Effort normal and breath sounds normal. No stridor. No respiratory distress. She has no wheezes. She has no rales. She exhibits no tenderness.  Lymphadenopathy:    She has no cervical adenopathy.  Skin: Skin is warm and dry. No rash noted. She is not diaphoretic.    ED Course  Procedures (including critical care time)  Results for orders placed  during the hospital encounter of 04/29/11  POCT RAPID STREP A ([facility] ONLY)      Component Value Range   Streptococcus, Group A Screen (Direct) NEGATIVE  NEGATIVE       Labs Reviewed  POCT RAPID STREP A ([facility] ONLY)   No results found.   1. Tonsillitis       MDM  Her rapid strep was negative, but she's been exposed and has many symptoms of strep throat. That she's pregnant, I am going to go ahead and treat her with amoxicillin. She states she frequently gets yeast infections, but could not  tolerate yeast creams. I did give her prescription for Diflucan, but told her to only get it filled if she needed it. Diflucan is category C., she understands this and accepts the likelihood of a possible risk.        Roque Lias, MD 04/29/11 [phone]

## 2011-04-29 NOTE — ED Notes (Signed)
C/o sore throat for 2 days.  States her son has had strep throat.

## 2011-05-03 NOTE — L&D Delivery Note (Signed)
Delivery Note At 6:06 AM a healthy female was delivered via NSVD (Presentation: LOA).  APGAR: 8, 9; weight pending.   Placenta status: delivered spontaneously .  Cord:  with the following complications: none .   Anesthesia:  epidural Episiotomy: none Lacerations: n/a Suture Repair: n/a Est. Blood Loss (mL): 350cc Fundus firm after delivery with pitocin and massage, bladder emptied--50 cc.  Mom to postpartum.  Baby to stay with mom in room.  Oliver Pila 12/07/2011, 6:24 AM

## 2011-05-13 LAB — OB RESULTS CONSOLE RPR: RPR: NONREACTIVE

## 2011-05-31 ENCOUNTER — Encounter (HOSPITAL_BASED_OUTPATIENT_CLINIC_OR_DEPARTMENT_OTHER): Payer: Self-pay | Admitting: *Deleted

## 2011-05-31 ENCOUNTER — Emergency Department (HOSPITAL_BASED_OUTPATIENT_CLINIC_OR_DEPARTMENT_OTHER)
Admission: EM | Admit: 2011-05-31 | Discharge: 2011-05-31 | Disposition: A | Discharge status: Home / Self Care | Payer: Medicaid Other | Attending: Emergency Medicine | Admitting: Emergency Medicine

## 2011-05-31 DIAGNOSIS — R110 Nausea: Secondary | ICD-10-CM | POA: Insufficient documentation

## 2011-05-31 DIAGNOSIS — R51 Headache: Secondary | ICD-10-CM

## 2011-05-31 MED ORDER — DIPHENHYDRAMINE HCL 25 MG PO CAPS
25.0000 mg | ORAL_CAPSULE | Freq: Once | ORAL | Status: AC
  Administered 2011-05-31: 25 mg via ORAL
  Filled 2011-05-31 (×2): qty 1

## 2011-05-31 MED ORDER — METOCLOPRAMIDE HCL 5 MG/ML IJ SOLN
10.0000 mg | Freq: Once | INTRAMUSCULAR | Status: AC
  Administered 2011-05-31: 10 mg via INTRAMUSCULAR
  Filled 2011-05-31: qty 2

## 2011-05-31 NOTE — ED Provider Notes (Signed)
Medical screening examination/treatment/procedure(s) were performed by non-physician practitioner and as supervising physician I was immediately available for consultation/collaboration.   Jamiyla Ishee A. Patrica Duel, MD 05/31/11 2340

## 2011-05-31 NOTE — ED Notes (Signed)
Pt amb to triage with quick steady gait , reports her typical migraine ha onset this am with nausea and photophobia.

## 2011-05-31 NOTE — ED Provider Notes (Signed)
History     CSN: [id number]  Arrival date & time 05/31/11  Rickey Primus   First MD Initiated Contact with Patient 05/31/11 1835      Chief Complaint  Patient presents with  . Migraine    (Consider location/radiation/quality/duration/timing/severity/associated sxs/prior treatment) Patient is a 31 y.o. female presenting with migraine. The history is provided by the patient. No language interpreter was used.  Migraine This is a new problem. The current episode started today. The problem occurs constantly. The problem has been gradually worsening. Associated symptoms include nausea. Pertinent negatives include no fever. The symptoms are aggravated by nothing. She has tried acetaminophen for the symptoms. The treatment provided no relief.   Pt complains of a migrane headache. Pt is [redacted] weeks pregnant Past Medical History  Diagnosis Date  . Anxiety   . Headache     History reviewed. No pertinent past surgical history.  No family history on file.  History  Substance Use Topics  . Smoking status: Never Smoker   . Smokeless tobacco: Never Used  . Alcohol Use: No     wine occasionally    OB History    Grav Para Term Preterm Abortions TAB SAB Ect Mult Living   11 3 3  7 5 2   2       Review of Systems  Constitutional: Negative for fever.  Gastrointestinal: Positive for nausea.  All other systems reviewed and are negative.    Allergies  Review of patient's allergies indicates no known allergies.  Home Medications   Current Outpatient Rx  Name Route Sig Dispense Refill  . ACETAMINOPHEN 500 MG PO TABS Oral Take 500 mg by mouth every 6 (six) hours as needed. Patient took this medication for a headache.     . CONCEPT OB 130-92.4-1 MG PO CAPS Oral Take 1 tablet by mouth daily. 30 capsule 12    BP 118/85  Pulse 88  Temp(Src) 99.1 F (37.3 C) (Oral)  Resp 18  Ht 5\' 10"  (1.778 m)  Wt 204 lb (92.534 kg)  BMI 29.27 kg/m2  SpO2 100%  LMP 02/28/2011  Physical Exam  Nursing  note and vitals reviewed. Constitutional: She appears well-developed and well-nourished.  HENT:  Head: Normocephalic.  Right Ear: External ear normal.  Mouth/Throat: Oropharynx is clear and moist.  Eyes: Pupils are equal, round, and reactive to light.  Neck: Normal range of motion.  Cardiovascular: Normal rate.   Pulmonary/Chest: Effort normal.  Abdominal: Soft.  Musculoskeletal: Normal range of motion.  Neurological: She is alert.  Skin: Skin is warm.  Psychiatric: She has a normal mood and affect.    ED Course  Procedures (including critical care time)  Labs Reviewed - No data to display No results found.   No diagnosis found.    MDM  Pt given reglan IM.         Langston Masker, [place] 05/31/11 1913

## 2011-05-31 NOTE — ED Notes (Signed)
Pt states she is [redacted] weeks pregnant, denies any bleeding, cramping, spotting or other c/o.

## 2011-06-14 ENCOUNTER — Institutional Professional Consult (permissible substitution): Payer: Medicaid Other | Admitting: Cardiology

## 2011-06-15 ENCOUNTER — Ambulatory Visit (INDEPENDENT_AMBULATORY_CARE_PROVIDER_SITE_OTHER): Payer: Medicaid Other | Admitting: Cardiology

## 2011-06-15 ENCOUNTER — Encounter: Payer: Self-pay | Admitting: Cardiology

## 2011-06-15 VITALS — BP 125/80 | HR 83 | Ht 70.0 in | Wt 215.0 lb

## 2011-06-15 DIAGNOSIS — R002 Palpitations: Secondary | ICD-10-CM | POA: Insufficient documentation

## 2011-06-15 LAB — BASIC METABOLIC PANEL
BUN: 10 mg/dL (ref 6–23)
Calcium: 8.9 mg/dL (ref 8.4–10.5)
GFR: 192.64 mL/min (ref >60.00)
Potassium: 3.8 mEq/L (ref 3.5–5.1)

## 2011-06-15 LAB — MAGNESIUM: Magnesium: 1.8 mg/dL (ref 1.5–2.5)

## 2011-06-15 NOTE — Patient Instructions (Signed)
Please have blood work today (BMP, TSH, MG)  The current medical regimen is effective;  continue present plan and medications.  Your physician has requested that you have an echocardiogram. Echocardiography is a painless test that uses sound waves to create images of your heart. It provides your doctor with information about the size and shape of your heart and how well your heart's chambers and valves are working. This procedure takes approximately one hour. There are no restrictions for this procedure.  Your physician has recommended that you wear a holter monitor for 24 hours. Holter monitors are medical devices that record the heart's electrical activity. Doctors most often use these monitors to diagnose arrhythmias. Arrhythmias are problems with the speed or rhythm of the heartbeat. The monitor is a small, portable device. You can wear one while you do your normal daily activities. This is usually used to diagnose what is causing palpitations/syncope (passing out).  Follow up will be based on these results

## 2011-06-15 NOTE — Progress Notes (Signed)
   HPI The patient presents for evaluation of palpitations. She is 4 months pregnant. She says that since 2005 or thereabouts she has been having palpitations. She notices them daily. She notices them particularly at night. Can feel a rapid beat or skipped beat. She does not have presyncope or syncope associated with these. She does try to avoid caffeine for the most part but doesn't notice an association. She does not describe chest discomfort, neck or arm discomfort. She does have some shortness of breath which she associates with being pregnant. This is with activity such as climbing stairs. She's not describing PND or orthopnea. She has never had any cardiac workup. She was referred by her GYN for further evaluation.  No Known Allergies  Current Outpatient Prescriptions  Medication Sig Dispense Refill  . acetaminophen (TYLENOL) 500 MG tablet Take 500 mg by mouth every 6 (six) hours as needed. Patient took this medication for a headache.       . Aspirin-Acetaminophen-Caffeine (GOODYS EXTRA STRENGTH PO) Take by mouth as needed.      Burnis Medin w/o A Vit-FeFum-FePo-FA (CONCEPT OB) 130-92.4-1 MG CAPS Take 1 tablet by mouth daily.  30 capsule  12    Past Medical History  Diagnosis Date  . Anxiety   . Headache     No past surgical history on file.  No family history on file.  History   Social History  . Marital Status: Single    Spouse Name: N/A    Number of Children: N/A  . Years of Education: N/A   Occupational History  . Not on file.   Social History Main Topics  . Smoking status: Never Smoker   . Smokeless tobacco: Never Used  . Alcohol Use: No     wine occasionally  . Drug Use: No  . Sexually Active: Yes   Other Topics Concern  . Not on file   Social History Narrative  . No narrative on file    ROS:  As stated in the HPI and negative for all other systems.  PHYSICAL EXAM BP 125/80  Pulse 83  Ht 5\' 10"  (1.778 m)  Wt 215 lb (97.523 kg)  BMI 30.85 kg/m2  LMP  02/28/2011 GENERAL:  Well appearing HEENT:  Pupils equal round and reactive, fundi not visualized, oral mucosa unremarkable NECK:  No jugular venous distention, waveform within normal limits, carotid upstroke brisk and symmetric, no bruits, no thyromegaly LYMPHATICS:  No cervical, inguinal adenopathy LUNGS:  Clear to auscultation bilaterally BACK:  No CVA tenderness CHEST:  Unremarkable HEART:  PMI not displaced or sustained,S1 and S2 within normal limits, no S3, no S4, no clicks, no rubs, no murmurs ABD:  Flat, positive bowel sounds normal in frequency in pitch, no bruits, no rebound, no guarding, no midline pulsatile mass, no hepatomegaly, no splenomegaly, gravid EXT:  2 plus pulses throughout, no edema, no cyanosis no clubbing SKIN:  No rashes no nodules NEURO:  Cranial nerves II through XII grossly intact, motor grossly intact throughout PSYCH:  Cognitively intact, oriented to person place and time  EKG:  Sinus rhythm, rate 83, axis within normal limits, intervals within normal limits, no acute ST-T wave changes. 06/15/2011    ASSESSMENT AND PLAN

## 2011-06-15 NOTE — Assessment & Plan Note (Signed)
To further evaluate these the patient will have a TSH, BMET and magnesium. I will check a 24-hour Holter monitor and an echocardiogram. It is unlikely that further treatment or therapy will be necessary but I will defer this decision until the results of these tests. We did discuss caffeine as possible exacerbating factor.

## 2011-06-28 ENCOUNTER — Encounter (INDEPENDENT_AMBULATORY_CARE_PROVIDER_SITE_OTHER): Payer: Medicaid Other

## 2011-06-28 ENCOUNTER — Other Ambulatory Visit: Payer: Self-pay

## 2011-06-28 ENCOUNTER — Ambulatory Visit (HOSPITAL_COMMUNITY): Payer: Medicaid Other | Attending: Cardiovascular Disease

## 2011-06-28 DIAGNOSIS — R0989 Other specified symptoms and signs involving the circulatory and respiratory systems: Secondary | ICD-10-CM | POA: Insufficient documentation

## 2011-06-28 DIAGNOSIS — R0609 Other forms of dyspnea: Secondary | ICD-10-CM | POA: Insufficient documentation

## 2011-06-28 DIAGNOSIS — I059 Rheumatic mitral valve disease, unspecified: Secondary | ICD-10-CM | POA: Insufficient documentation

## 2011-06-28 DIAGNOSIS — R002 Palpitations: Secondary | ICD-10-CM

## 2011-06-28 DIAGNOSIS — I079 Rheumatic tricuspid valve disease, unspecified: Secondary | ICD-10-CM | POA: Insufficient documentation

## 2011-07-22 ENCOUNTER — Encounter (HOSPITAL_COMMUNITY): Payer: Self-pay | Admitting: Obstetrics and Gynecology

## 2011-07-22 ENCOUNTER — Inpatient Hospital Stay (HOSPITAL_COMMUNITY)
Admission: AD | Admit: 2011-07-22 | Discharge: 2011-07-23 | Disposition: A | Discharge status: Home / Self Care | Payer: Medicaid Other | Attending: Obstetrics and Gynecology | Admitting: Obstetrics and Gynecology

## 2011-07-22 DIAGNOSIS — R109 Unspecified abdominal pain: Secondary | ICD-10-CM

## 2011-07-22 DIAGNOSIS — O9989 Other specified diseases and conditions complicating pregnancy, childbirth and the puerperium: Secondary | ICD-10-CM

## 2011-07-22 DIAGNOSIS — N949 Unspecified condition associated with female genital organs and menstrual cycle: Secondary | ICD-10-CM

## 2011-07-22 DIAGNOSIS — O99891 Other specified diseases and conditions complicating pregnancy: Secondary | ICD-10-CM | POA: Insufficient documentation
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
## 2011-07-22 NOTE — MAU Note (Signed)
"  I first started noticing tightening in my lower abd around 1800. They became painful about 2200.  I called my doctor and she told me to take some Ibuprofen to relax my uterus.  I took Ibuprofen 800 mg and it helped for a little while, but that was over fast.  I was having pain that radiated down my LT thigh at first, but now it's radiating down both my thighs. No leaking or bleeding.  (+) FM felt already."

## 2011-07-23 LAB — URINALYSIS, ROUTINE W REFLEX MICROSCOPIC
Bilirubin Urine: NEGATIVE
Ketones, ur: 15 mg/dL — AB
Nitrite: NEGATIVE
Protein, ur: NEGATIVE mg/dL
Specific Gravity, Urine: 1.02 (ref 1.005–1.030)
Urobilinogen, UA: 0.2 mg/dL (ref 0.0–1.0)

## 2011-07-23 LAB — URINE MICROSCOPIC-ADD ON

## 2011-07-23 NOTE — MAU Provider Note (Signed)
Chief Complaint:  Contractions    First Provider Initiated Contact with Patient 07/23/11 0002      Maria Ho is  31 y.o. [id number].  Patient's last menstrual period was 02/28/2011..  [redacted]w[redacted]d   She presents complaining of Contractions . Onset is described as intermittent and has been present for  Several  hours. Reports speaking with the on call MD who instructed her to take ibuprofen and lay down. States that tightening stopped for a couple hours then returned as sharp, shooting pain in lower abd that radiates down legs. Reports + FM, denies bleeding, LOF, vaginal discharge, dysuria, or back pain.  Obstetrical/Gynecological History: OB History    Grav Para Term Preterm Abortions TAB SAB Ect Mult Living   11 3 3  7 5 2   3       Past Medical History: Past Medical History  Diagnosis Date  . Anxiety     PTSD  . Headache     Migraines    Past Surgical History: Past Surgical History  Procedure Date  . None     Family History: Family History  Problem Relation Age of Onset  . Post-traumatic stress disorder Father   . Alcohol abuse Father   . Drug abuse Father   . Diabetes Maternal Grandmother   . Cancer Paternal Grandmother     Great-Grandparent  . Cancer Paternal Grandfather     Great-Gradnparent    Social History: History  Substance Use Topics  . Smoking status: Never Smoker   . Smokeless tobacco: Never Used  . Alcohol Use: No     wine occasionally- last was in Novemeber 2012    Allergies: No Known Allergies  Prescriptions prior to admission  Medication Sig Dispense Refill  . acetaminophen (TYLENOL) 500 MG tablet Take 500 mg by mouth every 6 (six) hours as needed. For headache.      . Aspirin-Acetaminophen-Caffeine (GOODYS EXTRA STRENGTH PO) Take 1 each by mouth as needed. For headache      . diphenhydrAMINE (BENADRYL) 25 mg capsule Take 25 mg by mouth every 6 (six) hours as needed. For sleep      . ibuprofen (ADVIL,MOTRIN) 200 MG tablet Take 800 mg by  mouth every 6 (six) hours as needed. For pain or headache      . Prenat w/o A Vit-FeFum-FePo-FA (CONCEPT OB) 130-92.4-1 MG CAPS Take 1 tablet by mouth daily.  30 capsule  12    Review of Systems - Negative except what has been reviewed in HPI  Physical Exam   Blood pressure 128/80, pulse 96, temperature 98.8 F (37.1 C), temperature source Oral, resp. rate 20, height 5\' 9"  (1.753 m), weight 225 lb (102.059 kg), last menstrual period 02/28/2011.  General: General appearance - alert, well appearing, and in no distress, oriented to person, place, and time and overweight Mental status - alert, oriented to person, place, and time, normal mood, behavior, speech, dress, motor activity, and thought processes, affect appropriate to mood Abdomen - soft, nontender, nondistended, no masses or organomegaly Focused Gynecological Exam: ext os 1cm/int os closed/long/thick/firm/post   Labs: UA: 15 ketones, Trace hbg   MD Consult: Discussed with Dr. Senaida Ores. 1:02 AM   Assessment: [redacted]w[redacted]d Abd Pain Probable RLP  Plan: Discharge home Comfort measures reviewed FU in office as scheduled  Colburn Asper E. 07/23/2011,12:58 AM

## 2011-07-23 NOTE — Discharge Instructions (Signed)
Abdominal Pain During Pregnancy Abdominal discomfort is common in pregnancy. Most of the time, it does not cause harm. There are many causes of abdominal pain. Some causes are more serious than others. Some of the causes of abdominal pain in pregnancy are easily diagnosed. Occasionally, the diagnosis takes time to understand. Other times, the cause is not determined. Abdominal pain can be a sign that something is very wrong with the pregnancy, or the pain may have nothing to do with the pregnancy at all. For this reason, always tell your caregiver if you have any abdominal discomfort. CAUSES Common and harmless causes of abdominal pain include:  Constipation.   Excess gas and bloating.   Round ligament pain. This is pain that is felt in the folds of the groin.   The position the baby or placenta is in.   Baby kicks.   Braxton-Hicks contractions. These are mild contractions that do not cause cervical dilation.  Serious causes of abdominal pain include:  Ectopic pregnancy. This happens when a fertilized egg implants outside of the uterus.   Miscarriage.   Preterm labor. This is when labor starts at less than 37 weeks of pregnancy.   Placental abruption. This is when the placenta partially or completely separates from the uterus.   Preeclampsia. This is often associated with high blood pressure and has been referred to as "toxemia in pregnancy."   Uterine or amniotic fluid infections.  Causes unrelated to pregnancy include:  Urinary tract infection.   Gallbladder stones or inflammation.   Hepatitis or other liver illness.   Intestinal problems, stomach flu, food poisoning, or ulcer.   Appendicitis.   Kidney (renal) stones.   Kidney infection (pylonephritis).  HOME CARE INSTRUCTIONS  For mild pain:  Do not have sexual intercourse or put anything in your vagina until your symptoms go away completely.   Get plenty of rest until your pain improves. If your pain does not  improve in 1 hour, call your caregiver.   Drink clear fluids if you feel nauseous. Avoid solid food as long as you are uncomfortable or nauseous.   Only take medicine as directed by your caregiver.   Keep all follow-up appointments with your caregiver.  SEEK IMMEDIATE MEDICAL CARE IF:  You are bleeding, leaking fluid, or passing tissue from the vagina.   You have increasing pain or cramping.   You have persistent vomiting.   You have painful or bloody urination.   You have a fever.   You notice a decrease in your baby's movements.   You have extreme weakness or feel faint.   You have shortness of breath, with or without abdominal pain.   You develop a severe headache with abdominal pain.   You have abnormal vaginal discharge with abdominal pain.   You have persistent diarrhea.   You have abdominal pain that continues even after rest, or gets worse.  MAKE SURE YOU:   Understand these instructions.   Will watch your condition.   Will get help right away if you are not doing well or get worse.  Document Released: 04/18/2005 Document Revised: 04/07/2011 Document Reviewed: 11/12/2010 [facility] Patient Information 2012 [place], [place].Round Ligament Pain The round ligament is made up of muscle and fibrous tissue. It is attached to the uterus near the fallopian tube. The round ligament is located on both sides of the uterus and helps support the position of the uterus. It usually begins in the second trimester of pregnancy when the uterus comes out of the  pelvis. The pain can come and go until the baby is delivered. Round ligament pain is not a serious problem and does not cause harm to the baby. CAUSE During pregnancy the uterus grows the most from the second trimester to delivery. As it grows, it stretches and slightly twists the round ligaments. When the uterus leans from one side to the other, the round ligament on the opposite side pulls and stretches. This can cause  pain. SYMPTOMS  Pain can occur on one side or both sides. The pain is usually a short, sharp, and pinching-like. Sometimes it can be a dull, lingering and aching pain. The pain is located in the lower side of the abdomen or in the groin. The pain is internal and usually starts deep in the groin and moves up to the outside of the hip area. Pain can occur with:  Sudden change in position like getting out of bed or a chair.   Rolling over in bed.   Coughing or sneezing.   Walking too much.   Any type of physical activity.  DIAGNOSIS  Your caregiver will make sure there are no serious problems causing the pain. When nothing serious is found, the symptoms usually indicate that the pain is from the round ligament. TREATMENT   Sit down and relax when the pain starts.   Flex your knees up to your belly.   Lay on your side with a pillow under your belly (abdomen) and another one between your legs.   Sit in a hot bath for 15 to 20 minutes or until the pain goes away.  HOME CARE INSTRUCTIONS   Only take over-the-counter or prescriptions medicines for pain, discomfort or fever as directed by your caregiver.   Sit and stand slowly.   Avoid long walks if it causes pain.   Stop or lessen your physical activities if it causes pain.  SEEK MEDICAL CARE IF:   The pain does not go away with any of your treatment.   You need stronger medication for the pain.   You develop back pain that you did not have before with the side pain.  SEEK IMMEDIATE MEDICAL CARE IF:   You develop a temperature of 102 F (38.9 C) or higher.   You develop uterine contractions.   You develop vaginal bleeding.   You develop nausea, vomiting or diarrhea.   You develop chills.   You have pain when you urinate.  Document Released: 01/26/2008 Document Revised: 04/07/2011 Document Reviewed: 01/26/2008 [facility] Patient Information 2012 [place], [place].

## 2011-07-28 ENCOUNTER — Encounter: Payer: Self-pay | Admitting: Cardiology

## 2011-10-20 ENCOUNTER — Inpatient Hospital Stay (HOSPITAL_COMMUNITY)
Admission: AD | Admit: 2011-10-20 | Discharge: 2011-10-21 | Disposition: A | Discharge status: Hospice | Payer: Medicaid Other | Source: Ambulatory Visit | Attending: Obstetrics and Gynecology | Admitting: Obstetrics and Gynecology

## 2011-10-20 ENCOUNTER — Encounter (HOSPITAL_COMMUNITY): Payer: Self-pay | Admitting: *Deleted

## 2011-10-20 DIAGNOSIS — O26899 Other specified pregnancy related conditions, unspecified trimester: Secondary | ICD-10-CM

## 2011-10-20 DIAGNOSIS — R109 Unspecified abdominal pain: Secondary | ICD-10-CM

## 2011-10-20 DIAGNOSIS — O99891 Other specified diseases and conditions complicating pregnancy: Secondary | ICD-10-CM | POA: Insufficient documentation

## 2011-10-20 LAB — URINALYSIS, ROUTINE W REFLEX MICROSCOPIC
Bilirubin Urine: NEGATIVE
Glucose, UA: NEGATIVE mg/dL
Specific Gravity, Urine: 1.01 (ref 1.005–1.030)
pH: 6.5 (ref 5.0–8.0)

## 2011-10-20 LAB — URINE MICROSCOPIC-ADD ON

## 2011-10-20 MED ORDER — CYCLOBENZAPRINE HCL 10 MG PO TABS
10.0000 mg | ORAL_TABLET | Freq: Once | ORAL | Status: AC
  Administered 2011-10-20: 10 mg via ORAL
  Filled 2011-10-20: qty 1

## 2011-10-20 MED ORDER — ACETAMINOPHEN 325 MG PO TABS
650.0000 mg | ORAL_TABLET | Freq: Once | ORAL | Status: AC
Start: 2011-10-20 — End: 2011-10-20
  Administered 2011-10-20: 650 mg via ORAL
  Filled 2011-10-20: qty 2

## 2011-10-20 NOTE — Progress Notes (Signed)
States it feels like someone is cutting me open in my low abdomen whenever I move.  States she is unable to bear weight or dress herself.

## 2011-10-20 NOTE — MAU Note (Signed)
Pt reports "i can't bear weight", pt states this has been progressively getting worse over the last month. When questioned pt states she can stand up (in a wheel chair now) but just can't stay on her feet long. Has sharp pain in pelvis when she walks. Denies bleeding. Reports good fetal movment.

## 2011-10-20 NOTE — Discharge Instructions (Signed)
Abdominal Pain During Pregnancy Abdominal discomfort is common in pregnancy. Most of the time, it does not cause harm. There are many causes of abdominal pain. Some causes are more serious than others. Some of the causes of abdominal pain in pregnancy are easily diagnosed. Occasionally, the diagnosis takes time to understand. Other times, the cause is not determined. Abdominal pain can be a sign that something is very wrong with the pregnancy, or the pain may have nothing to do with the pregnancy at all. For this reason, always tell your caregiver if you have any abdominal discomfort. CAUSES Common and harmless causes of abdominal pain include:  Constipation.   Excess gas and bloating.   Round ligament pain. This is pain that is felt in the folds of the groin.   The position the baby or placenta is in.   Baby kicks.   Braxton-Hicks contractions. These are mild contractions that do not cause cervical dilation.  Serious causes of abdominal pain include:  Ectopic pregnancy. This happens when a fertilized egg implants outside of the uterus.   Miscarriage.   Preterm labor. This is when labor starts at less than 37 weeks of pregnancy.   Placental abruption. This is when the placenta partially or completely separates from the uterus.   Preeclampsia. This is often associated with high blood pressure and has been referred to as "toxemia in pregnancy."   Uterine or amniotic fluid infections.  Causes unrelated to pregnancy include:  Urinary tract infection.   Gallbladder stones or inflammation.   Hepatitis or other liver illness.   Intestinal problems, stomach flu, food poisoning, or ulcer.   Appendicitis.   Kidney (renal) stones.   Kidney infection (pylonephritis).  HOME CARE INSTRUCTIONS  For mild pain:  Do not have sexual intercourse or put anything in your vagina until your symptoms go away completely.   Get plenty of rest until your pain improves. If your pain does not  improve in 1 hour, call your caregiver.   Drink clear fluids if you feel nauseous. Avoid solid food as long as you are uncomfortable or nauseous.   Only take medicine as directed by your caregiver.   Keep all follow-up appointments with your caregiver.  SEEK IMMEDIATE MEDICAL CARE IF:  You are bleeding, leaking fluid, or passing tissue from the vagina.   You have increasing pain or cramping.   You have persistent vomiting.   You have painful or bloody urination.   You have a fever.   You notice a decrease in your baby's movements.   You have extreme weakness or feel faint.   You have shortness of breath, with or without abdominal pain.   You develop a severe headache with abdominal pain.   You have abnormal vaginal discharge with abdominal pain.   You have persistent diarrhea.   You have abdominal pain that continues even after rest, or gets worse.  MAKE SURE YOU:   Understand these instructions.   Will watch your condition.   Will get help right away if you are not doing well or get worse.  Document Released: 04/18/2005 Document Revised: 04/07/2011 Document Reviewed: 11/12/2010 Exit[employer] Patient Information 2012 [employer].Abdominal Pain During Pregnancy Abdominal discomfort is common in pregnancy. Most of the time, it does not cause harm. There are many causes of abdominal pain. Some causes are more serious than others. Some of the causes of abdominal pain in pregnancy are easily diagnosed. Occasionally, the diagnosis takes time to understand. Other times, the cause is not determined.   Abdominal pain can be a sign that something is very wrong with the pregnancy, or the pain may have nothing to do with the pregnancy at all. For this reason, always tell your caregiver if you have any abdominal discomfort. CAUSES Common and harmless causes of abdominal pain include:  Constipation.   Excess gas and bloating.   Round ligament pain. This is pain that is felt in the  folds of the groin.   The position the baby or placenta is in.   Baby kicks.   Braxton-Hicks contractions. These are mild contractions that do not cause cervical dilation.  Serious causes of abdominal pain include:  Ectopic pregnancy. This happens when a fertilized egg implants outside of the uterus.   Miscarriage.   Preterm labor. This is when labor starts at less than 37 weeks of pregnancy.   Placental abruption. This is when the placenta partially or completely separates from the uterus.   Preeclampsia. This is often associated with high blood pressure and has been referred to as "toxemia in pregnancy."   Uterine or amniotic fluid infections.  Causes unrelated to pregnancy include:  Urinary tract infection.   Gallbladder stones or inflammation.   Hepatitis or other liver illness.   Intestinal problems, stomach flu, food poisoning, or ulcer.   Appendicitis.   Kidney (renal) stones.   Kidney infection (pylonephritis).  HOME CARE INSTRUCTIONS  For mild pain:  Do not have sexual intercourse or put anything in your vagina until your symptoms go away completely.   Get plenty of rest until your pain improves. If your pain does not improve in 1 hour, call your caregiver.   Drink clear fluids if you feel nauseous. Avoid solid food as long as you are uncomfortable or nauseous.   Only take medicine as directed by your caregiver.   Keep all follow-up appointments with your caregiver.  SEEK IMMEDIATE MEDICAL CARE IF:  You are bleeding, leaking fluid, or passing tissue from the vagina.   You have increasing pain or cramping.   You have persistent vomiting.   You have painful or bloody urination.   You have a fever.   You notice a decrease in your baby's movements.   You have extreme weakness or feel faint.   You have shortness of breath, with or without abdominal pain.   You develop a severe headache with abdominal pain.   You have abnormal vaginal discharge  with abdominal pain.   You have persistent diarrhea.   You have abdominal pain that continues even after rest, or gets worse.  MAKE SURE YOU:   Understand these instructions.   Will watch your condition.   Will get help right away if you are not doing well or get worse.  Document Released: 04/18/2005 Document Revised: 04/07/2011 Document Reviewed: 11/12/2010 ExitCare Patient Information 2012 [employer]. 

## 2011-10-20 NOTE — MAU Note (Signed)
Pt c/o sharp pain above vagina that has become increasingly worse and intense pelvic pressure that inhibits weight bearing

## 2011-10-20 NOTE — MAU Provider Note (Signed)
History     CSN: [id number]  Arrival date & time 10/20/11  2114   None     Chief Complaint  Patient presents with  . Pelvic Pain    (Consider location/radiation/quality/duration/timing/severity/associated sxs/prior treatment) HPI Maria Ho is a 31 y.o. female @ [redacted]w[redacted]d gestation who presents to MAU for low abdominal pain. The pain started 2 weeks ago and has continued. She describes the pain as sharp, tearing that caused legs to feel weak. "Fees like something cutting open my stomach." Rates pain as 10/10 at its worst but no pain at all when lying down. No pain at this time. Denies UTI symptoms.  No nausea, vomiting or other problems. The history was provided by the patient.  Past Medical History  Diagnosis Date  . Anxiety     PTSD  . Headache     Migraines    Past Surgical History  Procedure Date  . None   . No past surgeries     Family History  Problem Relation Age of Onset  . Post-traumatic stress disorder Father   . Alcohol abuse Father   . Drug abuse Father   . Diabetes Maternal Grandmother   . Cancer Paternal Grandmother     Great-Grandparent  . Cancer Paternal Grandfather     Great-Gradnparent    History  Substance Use Topics  . Smoking status: Never Smoker   . Smokeless tobacco: Never Used  . Alcohol Use: No     wine occasionally- last was in Novemeber 2012    OB History    Grav Para Term Preterm Abortions TAB SAB Ect Mult Living   11 3 3  7 5 2   3       Review of Systems  Constitutional: Positive for chills. Negative for fever, diaphoresis and fatigue.  HENT: Negative for ear pain, congestion, sore throat, facial swelling, neck pain, neck stiffness, dental problem and sinus pressure.   Eyes: Negative for photophobia, pain, discharge and visual disturbance.  Respiratory: Negative for cough, chest tightness and wheezing.   Cardiovascular: Negative for chest pain, palpitations and leg swelling.  Gastrointestinal: Positive for abdominal pain.  Negative for nausea, vomiting, diarrhea, constipation and abdominal distention.  Genitourinary: Positive for vaginal discharge. Negative for dysuria, urgency, frequency, flank pain, vaginal bleeding and difficulty urinating.  Musculoskeletal: Positive for back pain. Negative for myalgias and gait problem.  Skin: Negative for color change and rash.  Neurological: Positive for headaches. Negative for dizziness, speech difficulty, weakness, light-headedness and numbness.  Psychiatric/Behavioral: Negative for confusion and agitation. The patient is not nervous/anxious.        PTSD    Allergies  Review of patient's allergies indicates no known allergies.  Home Medications  No current outpatient prescriptions on file.  BP 130/84  Pulse 103  Temp 99.5 F (37.5 C)  Resp 16  Ht 5' 8.5" (1.74 m)  SpO2 99%  LMP 02/28/2011  Physical Exam  Nursing note and vitals reviewed. Constitutional: She is oriented to person, place, and time. She appears well-developed and well-nourished.  HENT:  Head: Normocephalic.  Eyes: EOM are normal.  Neck: Neck supple.  Cardiovascular: Normal rate.   Pulmonary/Chest: Effort normal.  Abdominal: Soft. There is no tenderness.       Unable to reproduce the pain that the patient has had.  Musculoskeletal: Normal range of motion.  Neurological: She is alert and oriented to person, place, and time. No cranial nerve deficit.  Skin: Skin is warm and dry.  Psychiatric: She has  a normal mood and affect. Her behavior is normal. Judgment and thought content normal.   EFM: Baseline FH 145 to 150 with good accelerations, no decelerations, rare contraction, reactive tracing  Results for orders placed during the hospital encounter of 10/20/11 (from the past 24 hour(s))  URINALYSIS, ROUTINE W REFLEX MICROSCOPIC     Status: Abnormal   Collection Time   10/20/11  9:39 PM      Component Value Range   Color, Urine YELLOW  YELLOW   APPearance CLEAR  CLEAR   Specific Gravity,  Urine 1.010  1.005 - 1.030   pH 6.5  5.0 - 8.0   Glucose, UA NEGATIVE  NEGATIVE mg/dL   Hgb urine dipstick TRACE (*) NEGATIVE   Bilirubin Urine NEGATIVE  NEGATIVE   Ketones, ur NEGATIVE  NEGATIVE mg/dL   Protein, ur NEGATIVE  NEGATIVE mg/dL   Urobilinogen, UA 0.2  0.0 - 1.0 mg/dL   Nitrite NEGATIVE  NEGATIVE   Leukocytes, UA SMALL (*) NEGATIVE  URINE MICROSCOPIC-ADD ON     Status: Abnormal   Collection Time   10/20/11  9:39 PM      Component Value Range   Squamous Epithelial / LPF RARE  RARE   WBC, UA 3-6  <3 WBC/hpf   RBC / HPF 0-2  <3 RBC/hpf   Bacteria, UA FEW (*) RARE   Urine-Other MUCOUS PRESENT     Dr. Ellyn Hack notified of clinical and lab findings and request that the patient have flexeril for the pain. She will refer patient to PT for assistance.   Assessment: Ligament pain during pregnancy  Plan:  Flexeril   Rest   Follow up in the office ED Course  Procedures    MDM

## 2011-10-21 LAB — URINE CULTURE

## 2011-11-07 LAB — OB RESULTS CONSOLE GBS: GBS: NEGATIVE

## 2011-11-18 ENCOUNTER — Encounter (HOSPITAL_COMMUNITY): Payer: Self-pay | Admitting: *Deleted

## 2011-11-18 ENCOUNTER — Emergency Department (HOSPITAL_COMMUNITY)
Admission: EM | Admit: 2011-11-18 | Discharge: 2011-11-18 | Disposition: A | Discharge status: Home / Self Care | Payer: Medicaid Other | Source: Home / Self Care | Attending: Emergency Medicine | Admitting: Emergency Medicine

## 2011-11-18 DIAGNOSIS — R0789 Other chest pain: Secondary | ICD-10-CM

## 2011-11-18 DIAGNOSIS — R071 Chest pain on breathing: Secondary | ICD-10-CM

## 2011-11-18 DIAGNOSIS — M25519 Pain in unspecified shoulder: Secondary | ICD-10-CM

## 2011-11-18 HISTORY — DX: Cardiac arrhythmia, unspecified: I49.9

## 2011-11-18 NOTE — ED Notes (Addendum)
Pt  Is  8  Months    Pregnant     -  She reports  l  Sided  Chest   Pain  With  Pain  Radiating  Into  l   Arm         Symptoms  Began  Today             She  denys  Any  Problems  With  Her  Pregnancy        She     Is  Awake  And  Alert  She  denys  Any  Shortness  Of  Breath        Her  Skin is  Warm  And  Dry     Cap  Refill is  Brisk

## 2011-11-18 NOTE — ED Notes (Signed)
Pt  Reports  Has  A  History  Of  arrythymia  In past        And  Has  Been  Seen  By  [facility]  Cardiology

## 2011-11-18 NOTE — ED Provider Notes (Signed)
History     CSN: [id number]  Arrival date & time 11/18/11  1756   First MD Initiated Contact with Patient 11/18/11 1759      Chief Complaint  Patient presents with  . Chest Pain    (Consider location/radiation/quality/duration/timing/severity/associated sxs/prior treatment) HPI Comments: She describes her early this morning she was sitting and started feeling pain in her left chest (points to left acromioclavicular joint area and lateral aspect of her deltoid region), patient describes that the pain was somewhat intense and she has not sustained an injury or fall wanted to make sure that this was not related to her heart she decided to come in. Patient denies any other associated symptoms such as nausea, vomiting, sweating, shortness of breath, or dizziness. Feels that the pain definitely exacerbates when she moves her arm especially when she raises her arm up. She describes no numbness, paresthesias to her left arm.  Patient is a 31 y.o. female presenting with chest pain. The history is provided by the patient.  Chest Pain The chest pain began 6 - 12 hours ago. Chest pain occurs constantly. The chest pain is unchanged. Associated with: Movement and raising up her left arm and moving her shoulder. At its most intense, the pain is at 8/10. The pain is currently at 0/10. The severity of the pain is moderate. The quality of the pain is described as aching. Chest pain is worsened by certain positions. Pertinent negatives for primary symptoms include no fever, no shortness of breath, no cough, no palpitations, no abdominal pain, no nausea, no vomiting and no dizziness.  Pertinent negatives for associated symptoms include no diaphoresis and no numbness.     Past Medical History  Diagnosis Date  . Anxiety     PTSD  . Headache     Migraines  . Arrhythmia     Past Surgical History  Procedure Date  . None   . No past surgeries     Family History  Problem Relation Age of Onset  .  Post-traumatic stress disorder Father   . Alcohol abuse Father   . Drug abuse Father   . Diabetes Maternal Grandmother   . Cancer Paternal Grandmother     Great-Grandparent  . Cancer Paternal Grandfather     Great-Gradnparent    History  Substance Use Topics  . Smoking status: Never Smoker   . Smokeless tobacco: Never Used  . Alcohol Use: No     wine occasionally- last was in Novemeber 2012    OB History    Grav Para Term Preterm Abortions TAB SAB Ect Mult Living   11 3 3  7 5 2   3       Review of Systems  Constitutional: Positive for activity change. Negative for fever, chills, diaphoresis and appetite change.  Respiratory: Negative for apnea, cough and shortness of breath.   Cardiovascular: Positive for chest pain. Negative for palpitations and leg swelling.  Gastrointestinal: Negative for nausea, vomiting and abdominal pain.  Skin: Negative for color change, rash and wound.  Neurological: Negative for dizziness and numbness.    Allergies  Review of patient's allergies indicates no known allergies.  Home Medications   Current Outpatient Rx  Name Route Sig Dispense Refill  . ACETAMINOPHEN 500 MG PO TABS Oral Take 500 mg by mouth every 6 (six) hours as needed. For headache or pain    . GOODYS EXTRA STRENGTH PO Oral Take 1 each by mouth as needed. For headache    . DIPHENHYDRAMINE  HCL 25 MG PO CAPS Oral Take 25 mg by mouth at bedtime as needed. For sleep    . CONCEPT OB 130-92.4-1 MG PO CAPS Oral Take 1 tablet by mouth daily. 30 capsule 12    BP 120/82  Pulse 106  Temp 98.6 F (37 C) (Oral)  Resp 16  SpO2 100%  LMP 02/28/2011  Physical Exam  Nursing note and vitals reviewed. Constitutional: Vital signs are normal. She appears well-developed and well-nourished.  Non-toxic appearance. She does not have a sickly appearance. She does not appear ill. No distress.    Cardiovascular: Normal rate.  Exam reveals no friction rub.   No murmur heard. Pulmonary/Chest:  Effort normal. No respiratory distress. She has no wheezes. She has no rales. She exhibits no tenderness.  Neurological: She is alert.  Skin: Skin is warm. No rash noted. No erythema.    ED Course  Procedures (including critical care time)  Labs Reviewed - No data to display No results found.   1. Acromioclavicular joint pain   2. Chest wall pain       MDM  Patient with a normal EKG in sinus tachycardia to 106 no ST or T changes. Exam was focal to tenderness in the left acromioclavicular joint and surrounding area with mild superficial digital palpation. I have discussed with patient that her symptoms and her normal EKG were not consistent with a cardiac event, have discussed what symptoms will warrant further evaluation in the emergency department such as exacerbated chest pain different pattern of pain, shortness of breath or dizziness patient agree with treatment plan and followup care as needed.        Jimmie Molly, MD 11/18/11 Paulo Fruit

## 2011-11-21 ENCOUNTER — Encounter (HOSPITAL_COMMUNITY): Payer: Self-pay

## 2011-11-21 ENCOUNTER — Inpatient Hospital Stay (HOSPITAL_COMMUNITY)
Admission: AD | Admit: 2011-11-21 | Discharge: 2011-11-22 | Disposition: A | Discharge status: Home / Self Care | Payer: Medicaid Other | Source: Ambulatory Visit | Attending: Obstetrics and Gynecology | Admitting: Obstetrics and Gynecology

## 2011-11-21 DIAGNOSIS — O479 False labor, unspecified: Secondary | ICD-10-CM | POA: Insufficient documentation

## 2011-11-21 HISTORY — DX: Herpesviral infection, unspecified: B00.9

## 2011-11-21 NOTE — MAU Note (Signed)
Contracting every 3-5 minutes since 7pm tonight. Denies leaking of fluid or vaginal bleeding. Positive fetal movement.

## 2011-11-22 ENCOUNTER — Encounter (HOSPITAL_COMMUNITY): Payer: Self-pay

## 2011-11-22 NOTE — Progress Notes (Signed)
FHT from 7-22 to 7-23 reviewed.  Reactive NST, no significant decels, irregular ctx.

## 2011-11-22 NOTE — ED Notes (Signed)
Pt  No  Symptoms  Of  Any  Condition requiring  Isolation          -    Pt  Also     Reports  Pain  In l  Upper  Shoulder  Area  Very  Mild

## 2011-12-07 ENCOUNTER — Encounter (HOSPITAL_COMMUNITY): Payer: Self-pay | Admitting: Anesthesiology

## 2011-12-07 ENCOUNTER — Encounter (HOSPITAL_COMMUNITY): Payer: Self-pay | Admitting: *Deleted

## 2011-12-07 ENCOUNTER — Inpatient Hospital Stay (HOSPITAL_COMMUNITY): Payer: Medicaid Other | Admitting: Anesthesiology

## 2011-12-07 ENCOUNTER — Inpatient Hospital Stay (HOSPITAL_COMMUNITY)
Admission: AD | Admit: 2011-12-07 | Discharge: 2011-12-08 | DRG: 775 | Disposition: A | Discharge status: Home / Self Care | Payer: Medicaid Other | Source: Ambulatory Visit | Attending: Obstetrics and Gynecology | Admitting: Obstetrics and Gynecology

## 2011-12-07 DIAGNOSIS — O34599 Maternal care for other abnormalities of gravid uterus, unspecified trimester: Principal | ICD-10-CM | POA: Diagnosis present

## 2011-12-07 DIAGNOSIS — D4959 Neoplasm of unspecified behavior of other genitourinary organ: Principal | ICD-10-CM | POA: Diagnosis present

## 2011-12-07 DIAGNOSIS — D259 Leiomyoma of uterus, unspecified: Secondary | ICD-10-CM | POA: Diagnosis present

## 2011-12-07 LAB — PREPARE RBC (CROSSMATCH)

## 2011-12-07 LAB — CBC
HCT: 38.7 % (ref 36.0–46.0)
MCHC: 32 g/dL (ref 30.0–36.0)
RDW: 16.1 % — ABNORMAL HIGH (ref 11.5–15.5)
WBC: 10.4 10*3/uL (ref 4.0–10.5)

## 2011-12-07 LAB — RPR: RPR Ser Ql: NONREACTIVE

## 2011-12-07 MED ORDER — IBUPROFEN 600 MG PO TABS: 600.0000 mg | ORAL_TABLET | Freq: Four times a day (QID) | ORAL | Status: DC | PRN

## 2011-12-07 MED ORDER — SERTRALINE HCL 50 MG PO TABS
50.0000 mg | ORAL_TABLET | Freq: Every day | ORAL | Status: DC
Start: 2011-12-07 — End: 2011-12-08
  Administered 2011-12-07 – 2011-12-08 (×2): 50 mg via ORAL
  Filled 2011-12-07 (×3): qty 1

## 2011-12-07 MED ORDER — ONDANSETRON HCL 4 MG/2ML IJ SOLN: 4.0000 mg | INTRAMUSCULAR | Status: DC | PRN

## 2011-12-07 MED ORDER — EPHEDRINE 5 MG/ML INJ
10.0000 mg | INTRAVENOUS | Status: DC | PRN
  Filled 2011-12-07: qty 2

## 2011-12-07 MED ORDER — FLEET ENEMA 7-19 GM/118ML RE ENEM: 1.0000 | ENEMA | RECTAL | Status: DC | PRN

## 2011-12-07 MED ORDER — OXYTOCIN BOLUS FROM INFUSION
250.0000 mL | Freq: Once | INTRAVENOUS | Status: AC
  Administered 2011-12-07: 250 mL via INTRAVENOUS
  Filled 2011-12-07: qty 500

## 2011-12-07 MED ORDER — OXYCODONE-ACETAMINOPHEN 5-325 MG PO TABS
1.0000 | ORAL_TABLET | ORAL | Status: DC | PRN
  Administered 2011-12-07 – 2011-12-08 (×4): 1 via ORAL
  Administered 2011-12-08: 2 via ORAL
  Administered 2011-12-08: 1 via ORAL
  Filled 2011-12-07: qty 2
  Filled 2011-12-07: qty 1
  Filled 2011-12-07: qty 2
  Filled 2011-12-07 (×2): qty 1
  Filled 2011-12-07: qty 2
  Filled 2011-12-07: qty 1

## 2011-12-07 MED ORDER — LACTATED RINGERS IV SOLN
500.0000 mL | Freq: Once | INTRAVENOUS | Status: AC
  Administered 2011-12-07: 500 mL via INTRAVENOUS

## 2011-12-07 MED ORDER — FENTANYL 2.5 MCG/ML BUPIVACAINE 1/10 % EPIDURAL INFUSION (WH - ANES)
14.0000 mL/h | INTRAMUSCULAR | Status: DC
  Filled 2011-12-07: qty 60

## 2011-12-07 MED ORDER — SIMETHICONE 80 MG PO CHEW: 80.0000 mg | CHEWABLE_TABLET | ORAL | Status: DC | PRN

## 2011-12-07 MED ORDER — LANOLIN HYDROUS EX OINT: TOPICAL_OINTMENT | CUTANEOUS | Status: DC | PRN

## 2011-12-07 MED ORDER — BENZOCAINE-MENTHOL 20-0.5 % EX AERO: 1.0000 "application " | INHALATION_SPRAY | CUTANEOUS | Status: DC | PRN

## 2011-12-07 MED ORDER — LIDOCAINE HCL (PF) 1 % IJ SOLN
30.0000 mL | INTRAMUSCULAR | Status: DC | PRN
  Filled 2011-12-07: qty 30

## 2011-12-07 MED ORDER — EPHEDRINE 5 MG/ML INJ
10.0000 mg | INTRAVENOUS | Status: DC | PRN
  Filled 2011-12-07: qty 2
  Filled 2011-12-07: qty 4

## 2011-12-07 MED ORDER — DIPHENHYDRAMINE HCL 25 MG PO CAPS: 25.0000 mg | ORAL_CAPSULE | Freq: Four times a day (QID) | ORAL | Status: DC | PRN

## 2011-12-07 MED ORDER — ZOLPIDEM TARTRATE 5 MG PO TABS: 5.0000 mg | ORAL_TABLET | Freq: Every evening | ORAL | Status: DC | PRN

## 2011-12-07 MED ORDER — OXYTOCIN 40 UNITS IN LACTATED RINGERS INFUSION - SIMPLE MED
62.5000 mL/h | Freq: Once | INTRAVENOUS | Status: DC
  Filled 2011-12-07: qty 1000

## 2011-12-07 MED ORDER — LACTATED RINGERS IV SOLN
INTRAVENOUS | Status: DC
  Administered 2011-12-07 (×2): via INTRAVENOUS

## 2011-12-07 MED ORDER — DIBUCAINE 1 % RE OINT: 1.0000 "application " | TOPICAL_OINTMENT | RECTAL | Status: DC | PRN

## 2011-12-07 MED ORDER — PHENYLEPHRINE 40 MCG/ML (10ML) SYRINGE FOR IV PUSH (FOR BLOOD PRESSURE SUPPORT)
80.0000 ug | PREFILLED_SYRINGE | INTRAVENOUS | Status: DC | PRN
  Filled 2011-12-07: qty 2

## 2011-12-07 MED ORDER — CLINDAMYCIN HCL 300 MG PO CAPS
300.0000 mg | ORAL_CAPSULE | Freq: Three times a day (TID) | ORAL | Status: DC
  Administered 2011-12-07 – 2011-12-08 (×4): 300 mg via ORAL
  Filled 2011-12-07 (×7): qty 1

## 2011-12-07 MED ORDER — ONDANSETRON HCL 4 MG/2ML IJ SOLN: 4.0000 mg | Freq: Four times a day (QID) | INTRAMUSCULAR | Status: DC | PRN

## 2011-12-07 MED ORDER — DIPHENHYDRAMINE HCL 50 MG/ML IJ SOLN: 12.5000 mg | INTRAMUSCULAR | Status: DC | PRN

## 2011-12-07 MED ORDER — PRENATAL MULTIVITAMIN CH
1.0000 | ORAL_TABLET | Freq: Every day | ORAL | Status: DC
  Administered 2011-12-07 – 2011-12-08 (×2): 1 via ORAL
  Filled 2011-12-07 (×2): qty 1

## 2011-12-07 MED ORDER — SENNOSIDES-DOCUSATE SODIUM 8.6-50 MG PO TABS
2.0000 | ORAL_TABLET | Freq: Every day | ORAL | Status: DC
  Administered 2011-12-07: 2 via ORAL

## 2011-12-07 MED ORDER — IBUPROFEN 600 MG PO TABS
600.0000 mg | ORAL_TABLET | Freq: Four times a day (QID) | ORAL | Status: DC
  Administered 2011-12-07 – 2011-12-08 (×5): 600 mg via ORAL
  Filled 2011-12-07 (×6): qty 1

## 2011-12-07 MED ORDER — TETANUS-DIPHTH-ACELL PERTUSSIS 5-2.5-18.5 LF-MCG/0.5 IM SUSP: 0.5000 mL | Freq: Once | INTRAMUSCULAR | Status: DC

## 2011-12-07 MED ORDER — FENTANYL 2.5 MCG/ML BUPIVACAINE 1/10 % EPIDURAL INFUSION (WH - ANES)
INTRAMUSCULAR | Status: DC | PRN
  Administered 2011-12-07: 14 mL/h via EPIDURAL

## 2011-12-07 MED ORDER — PHENYLEPHRINE 40 MCG/ML (10ML) SYRINGE FOR IV PUSH (FOR BLOOD PRESSURE SUPPORT)
80.0000 ug | PREFILLED_SYRINGE | INTRAVENOUS | Status: DC | PRN
  Filled 2011-12-07: qty 5
  Filled 2011-12-07: qty 2

## 2011-12-07 MED ORDER — ONDANSETRON HCL 4 MG PO TABS: 4.0000 mg | ORAL_TABLET | ORAL | Status: DC | PRN

## 2011-12-07 MED ORDER — OXYCODONE-ACETAMINOPHEN 5-325 MG PO TABS: 1.0000 | ORAL_TABLET | ORAL | Status: DC | PRN

## 2011-12-07 MED ORDER — WITCH HAZEL-GLYCERIN EX PADS: 1.0000 "application " | MEDICATED_PAD | CUTANEOUS | Status: DC | PRN

## 2011-12-07 MED ORDER — LIDOCAINE HCL (PF) 1 % IJ SOLN
INTRAMUSCULAR | Status: DC | PRN
  Administered 2011-12-07 (×4): 4 mL

## 2011-12-07 MED ORDER — CITRIC ACID-SODIUM CITRATE 334-500 MG/5ML PO SOLN: 30.0000 mL | ORAL | Status: DC | PRN

## 2011-12-07 MED ORDER — ACETAMINOPHEN 325 MG PO TABS
650.0000 mg | ORAL_TABLET | ORAL | Status: DC | PRN
  Administered 2011-12-07: 650 mg via ORAL
  Filled 2011-12-07: qty 2

## 2011-12-07 MED ORDER — LACTATED RINGERS IV SOLN: 500.0000 mL | INTRAVENOUS | Status: DC | PRN

## 2011-12-07 NOTE — MAU Note (Signed)
Contraction every 5-6 minutes since 1am

## 2011-12-07 NOTE — H&P (Signed)
Maria Ho is a 31 y.o. female G14P30 10 3 (EDD 12/12/11 by LMP c/w 10 week [place]) presenting for regular contractions beginning about 130am.  Pt was admitted at 300am and was 4cm, received epidural and progressed to 8cm.  Prenatal care complicated by some early cervical shortening which remained stable.  Also saw [facility] cardiology for some irregular heart beat issuea and w/u was benign.  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  She has a large fundal fibroid noted on [place] at 9cm.    Maternal Medical History:  Reason for admission: Reason for admission: contractions.  Contractions: Onset was 3-5 hours ago.   Frequency: regular.   Perceived severity is strong.    Fetal activity: Perceived fetal activity is normal.      OB History    Grav Para Term Preterm Abortions TAB SAB Ect Mult Living   14 3 3  10 9 1   3     1997 NSVD 7#15oz 2002 NSVD 6#7oz 2003 NSVD 7#7oz EAB x 9 SAB x 1  Past Medical History  Diagnosis Date  . Anxiety     PTSD  . Headache     Migraines  . Arrhythmia   . HSV (herpes simplex virus) infection     no outbreak during current pregnancy   Past Surgical History  Procedure Date  . None   . No past surgeries    Family History: family history includes Alcohol abuse in her father; Cancer in her paternal grandfather and paternal grandmother; Diabetes in her maternal grandmother; Drug abuse in her father; and Post-traumatic stress disorder in her father. Social History:  reports that she has never smoked. She has never used smokeless tobacco. She reports that she uses illicit drugs (Marijuana). She reports that she does not drink alcohol.   Prenatal Transfer Tool  Maternal Diabetes: No Genetic Screening: Normal Maternal Ultrasounds/Referrals: Normal Fetal Ultrasounds or other Referrals:  None Maternal Substance Abuse:  No Significant Maternal Medications:  Meds include: Other: see prenatal record valtrex for HSV  suppression Significant Maternal Lab Results:  None Other Comments:  None  ROS  Dilation: 8 Effacement (%): 100 Station: 0 Exam by:: K Fraley RN  Blood pressure 143/83, pulse 98, temperature 98.1 F (36.7 C), temperature source Oral, resp. rate 18, height 5\' 8"  (1.727 m), weight 112.946 kg (249 lb), last menstrual period 02/28/2011, SpO2 98.00%. Maternal Exam:  Uterine Assessment: Contraction strength is moderate.  Contraction frequency is regular.   Abdomen: Patient reports no abdominal tenderness. Fetal presentation: vertex  Introitus: Normal vulva. Vulva is negative for lesion.  Normal vagina.    Physical Exam  Constitutional: She is oriented to person, place, and time. She appears well-developed and well-nourished.  Cardiovascular: Normal rate and regular rhythm.   Respiratory: Effort normal and breath sounds normal.  GI: Soft. Bowel sounds are normal.  Genitourinary: Vagina normal and uterus normal. Vulva exhibits no lesion.  Neurological: She is alert and oriented to person, place, and time.  Psychiatric: She has a normal mood and affect. Her behavior is normal.    Prenatal labs: ABO, Rh:  O positive Antibody:  negative Rubella: Equivocal (01/11 0000) RPR: Nonreactive (01/11 0000)  HBsAg: Negative (01/11 0000)  HIV:   NR GBS: Negative (07/08 0000)  One hour GTT 62 Hgb AA First trimester screen and AFP WNL Assessment/Plan: Pt admitted in labor and comfortable with epidural.  Plan AROM and delivery.   Oliver Pila 12/07/2011, 5:36 AM

## 2011-12-07 NOTE — Clinical Social Work Maternal (Signed)
Clinical Social Work Department PSYCHOSOCIAL ASSESSMENT - MATERNAL/CHILD 12/07/2011  Patient:  Maria Ho, Maria Ho  Account Number:  [id number]  Admit Date:  12/07/2011  Marjo Bicker Name:   Maria Ho    Clinical Social Worker:  Andy Gauss   Date/Time:  12/07/2011 11:30 AM  Date Referred:  12/07/2011   Referral source  CN     Referred reason  Behavioral Health Issues  Substance Abuse   Other referral source:    I:  FAMILY / HOME ENVIRONMENT Child's legal guardian:  PARENT  Guardian - Name Guardian - Age Guardian - Address  Maria Ho 31 [address] [place], [place] [address]  Not disclosed     Other household support members/support persons Name Relationship DOB   SON 09/17/95   SON 10/03/00   SON 09/01/01   Other support:   Pt's mother, Maria Ho    II  PSYCHOSOCIAL DATA Information Source:  Patient Interview  Financial and Community Resources Employment:   Financial resources:  [employer] If Medicaid - County:  [place] Other  [employer]  Chemical engineer / Grade:  Armed forces operational officer / Statistician / Early Interventions:  Cultural issues impacting care:    III  STRENGTHS Strengths  Adequate Resources  Home prepared for Child (including basic supplies)  Supportive family/friends   Strength comment:    IV  RISK FACTORS AND CURRENT PROBLEMS Current Problem:  YES   Risk Factor & Current Problem Patient Issue Family Issue Risk Factor / Current Problem Comment  Mental Illness Y N Hx of depression, anxiety & PTSD  Substance Abuse Y N Hx of MJ use   N N     V  SOCIAL WORK ASSESSMENT Sw met with 31 year old, G14P4 referred for history of depression/anxiety, PTSD and MJ use.  Pt told Sw that she was diagnosed with depression/anxiety some time last year. She can't remember the name of the mental health professional she saw or agency affiliation.  She participated in therapy for approximately 5 months  and took medication to treat symptoms.  She stopped taking the medication once pregnancy confirmation received.  Pt told Sw that she wasn't able to cope well without the medication, "at first," stating, as she said "it was a challenge."  She denies any SI.  Pt expressed interest in starting an anti-depressant during hospitalization, as an attempt to avoid PP depression.  Pt has not talked to FOB since December, which is a source of depression.  He has 2 other children and will marry his fiance in 2 weeks.  Pt sent him an e-mail today to tell him about the delivery and hopeful he will respond.  She does not plan to continue treatment with the mental health agency she worked with prior to pregnancy due to disorganization.  Sw will provide pt with list of counseling agencies that accept Medicaid, as Ho source is a barrier.  She denies any MJ use in 2 years.  Sw asked RN to call MD to request an antidepressant to order for pt.  She has all the necessary supplies and good family support.  Sw is concerned this pt is at risk of experiencing PP depression. Sw available to assist further if needed.      VI SOCIAL WORK PLAN Social Work Plan  No Further Intervention Required / No Barriers to Discharge   Type of pt/family education:   If child protective services report - county:   If  child protective services report - date:   Information/referral to community resources comment:   Other social work plan:

## 2011-12-07 NOTE — Progress Notes (Signed)
Dr. Senaida Ores notified patient arrived for labor eval. SVE 4/70/-2 contractions every 3-5 mins. 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 Take valtrex sometimes. GBS neg. BP elevated on arrival 147-152/87-93. No problems with BP this pregnancy. May be related to pain. Orders received to admit patient Epidural prn. Recheck patient in 2 hours and call Dr. Senaida Ores with update.

## 2011-12-07 NOTE — Anesthesia Procedure Notes (Signed)
Epidural Patient location during procedure: OB Start time: 12/07/2011 4:02 AM Reason for block: procedure for pain  Staffing Performed by: anesthesiologist   Preanesthetic Checklist Completed: patient identified, site marked, surgical consent, pre-op evaluation, timeout performed, IV checked, risks and benefits discussed and monitors and equipment checked  Epidural Patient position: sitting Prep: site prepped and draped and DuraPrep Patient monitoring: continuous pulse ox and blood pressure Approach: midline Injection technique: LOR air  Needle:  Needle type: Tuohy  Needle gauge: 17 G Needle length: 9 cm Needle insertion depth: 5 cm cm Catheter type: closed end flexible Catheter size: 19 Gauge Catheter at skin depth: 10 cm Test dose: negative  Assessment Events: blood not aspirated, injection not painful, no injection resistance, negative IV test and no paresthesia  Additional Notes Discussed risk of headache, infection, bleeding, nerve injury and failed or incomplete block.  Patient voices understanding and wishes to proceed.

## 2011-12-07 NOTE — Anesthesia Preprocedure Evaluation (Signed)
Anesthesia Evaluation  Patient identified by MRN, date of birth, ID band Patient awake    Reviewed: Allergy & Precautions, H&P , NPO status , Patient's Chart, lab work & pertinent test results, reviewed documented beta blocker date and time   History of Anesthesia Complications Negative for: history of anesthetic complications  Airway Mallampati: II TM Distance: >3 FB Neck ROM: full    Dental  (+) Teeth Intact   Pulmonary neg pulmonary ROS,  breath sounds clear to auscultation        Cardiovascular + dysrhythmias (no current meds) Supra Ventricular Tachycardia Rhythm:regular Rate:Normal     Neuro/Psych  Headaches, PSYCHIATRIC DISORDERS (PTSD, anxiety)    GI/Hepatic negative GI ROS, Neg liver ROS, (+)       marijuana use,   Endo/Other  negative endocrine ROS  Renal/GU negative Renal ROS     Musculoskeletal   Abdominal   Peds  Hematology negative hematology ROS (+)   Anesthesia Other Findings   Reproductive/Obstetrics (+) Pregnancy                           Anesthesia Physical Anesthesia Plan  ASA: II  Anesthesia Plan: Epidural   Post-op Pain Management:    Induction:   Airway Management Planned:   Additional Equipment:   Intra-op Plan:   Post-operative Plan:   Informed Consent: I have reviewed the patients History and Physical, chart, labs and discussed the procedure including the risks, benefits and alternatives for the proposed anesthesia with the patient or authorized representative who has indicated his/her understanding and acceptance.     Plan Discussed with:   Anesthesia Plan Comments:         Anesthesia Quick Evaluation

## 2011-12-07 NOTE — Anesthesia Postprocedure Evaluation (Signed)
  Anesthesia Post-op Note  Patient: Maria Ho  Procedure(s) Performed: * No procedures listed *  Patient Location: PACU and Mother/Baby  Anesthesia Type: Epidural  Level of Consciousness: awake, alert  and oriented  Airway and Oxygen Therapy: Patient Spontanous Breathing  Post-op Pain: none  Post-op Assessment: Post-op Vital signs reviewed and Patient's Cardiovascular Status Stable  Post-op Vital Signs: Reviewed and stable  Complications: No apparent anesthesia complications

## 2011-12-08 LAB — CBC
Hemoglobin: 12 g/dL (ref 12.0–15.0)
MCH: 25.5 pg — ABNORMAL LOW (ref 26.0–34.0)
MCHC: 31.7 g/dL (ref 30.0–36.0)
Platelets: 250 10*3/uL (ref 150–400)
RDW: 16.3 % — ABNORMAL HIGH (ref 11.5–15.5)

## 2011-12-08 MED ORDER — MEASLES, MUMPS & RUBELLA VAC ~~LOC~~ INJ
0.5000 mL | INJECTION | Freq: Once | SUBCUTANEOUS | Status: AC
  Administered 2011-12-08: 0.5 mL via SUBCUTANEOUS
  Filled 2011-12-08: qty 0.5

## 2011-12-08 MED ORDER — OXYCODONE-ACETAMINOPHEN 5-325 MG PO TABS: 1.0000 | ORAL_TABLET | ORAL | Status: AC | PRN

## 2011-12-08 MED ORDER — IBUPROFEN 600 MG PO TABS: 600.0000 mg | ORAL_TABLET | Freq: Four times a day (QID) | ORAL | Status: AC

## 2011-12-08 MED ORDER — SERTRALINE HCL 50 MG PO TABS: 50.0000 mg | ORAL_TABLET | Freq: Every day | ORAL | Status: DC

## 2011-12-08 NOTE — Discharge Summary (Signed)
Obstetric Discharge Summary Reason for Admission: onset of labor Prenatal Procedures: none Intrapartum Procedures: spontaneous vaginal delivery Postpartum Procedures: antidepressants begun Complications-Operative and Postpartum: none Hemoglobin  Date Value Range Status  12/08/2011 12.0  12.0 - 15.0 g/dL Final     HCT  Date Value Range Status  12/08/2011 37.9  36.0 - 46.0 % Final    Physical Exam:  General: alert and cooperative Lochia: appropriate Uterine Fundus: firm   Discharge Diagnoses: Term Pregnancy-delivered  Discharge Information: Date: 12/08/2011 Activity: pelvic rest Diet: routine Medications: Ibuprofen, Percocet and zoloft Condition: improved Instructions: refer to practice specific booklet Discharge to: home   Newborn Data: Live born female  Birth Weight: 7 lb 4.2 oz (3294 g) APGAR: 8, 9  Home with mother.  Maria Ho 12/08/2011, 9:09 AM

## 2011-12-08 NOTE — Progress Notes (Signed)
UR Chart review completed.  

## 2011-12-08 NOTE — Progress Notes (Signed)
Post Partum Day1 Subjective: no complaints, up ad lib and tolerating PO  Requesting probable early d/c  Objective: Blood pressure 127/77, pulse 93, temperature 98.7 F (37.1 C), temperature source Oral, resp. rate 18, height 5\' 8"  (1.727 m), weight 112.946 kg (249 lb), last menstrual period 02/28/2011, SpO2 97.00%, unknown if currently breastfeeding.  Physical Exam:  General: alert and cooperative Lochia: appropriate Uterine Fundus: firm    Basename 12/08/11 0520 12/07/11 0300  HGB 12.0 12.4  HCT 37.9 38.7    Assessment/Plan: Discharge home Motrin percocet and zoloft F/u 6 weeks Plans IUD   LOS: 1 day   Maria Ho W 12/08/2011, 9:06 AM

## 2011-12-09 LAB — TYPE AND SCREEN: Unit division: 0

## 2012-05-07 ENCOUNTER — Encounter (HOSPITAL_COMMUNITY): Payer: Self-pay | Admitting: Emergency Medicine

## 2012-05-07 ENCOUNTER — Emergency Department (HOSPITAL_COMMUNITY)
Admission: EM | Admit: 2012-05-07 | Discharge: 2012-05-07 | Disposition: A | Discharge status: Home / Self Care | Payer: Medicaid Other | Source: Home / Self Care | Attending: Family Medicine | Admitting: Family Medicine

## 2012-05-07 DIAGNOSIS — N390 Urinary tract infection, site not specified: Secondary | ICD-10-CM

## 2012-05-07 LAB — POCT PREGNANCY, URINE: Preg Test, Ur: NEGATIVE

## 2012-05-07 LAB — POCT URINALYSIS DIP (DEVICE)
Glucose, UA: NEGATIVE mg/dL
Leukocytes, UA: NEGATIVE
Nitrite: POSITIVE — AB
Urobilinogen, UA: 0.2 mg/dL (ref 0.0–1.0)

## 2012-05-07 MED ORDER — CEPHALEXIN 500 MG PO CAPS: 500.0000 mg | ORAL_CAPSULE | Freq: Four times a day (QID) | ORAL | Status: DC

## 2012-05-07 NOTE — ED Provider Notes (Signed)
History     CSN: [id number]  Arrival date & time 05/07/12  1455   First MD Initiated Contact with Patient 05/07/12 1457      Chief Complaint  Patient presents with  . Urinary Tract Infection    (Consider location/radiation/quality/duration/timing/severity/associated sxs/prior treatment) Patient is a 32 y.o. female presenting with urinary tract infection. The history is provided by the patient.  Urinary Tract Infection This is a new problem. The current episode started more than 2 days ago. The problem has not changed since onset.   Past Medical History  Diagnosis Date  . Anxiety     PTSD  . Headache     Migraines  . Arrhythmia   . HSV (herpes simplex virus) infection     no outbreak during current pregnancy    Past Surgical History  Procedure Date  . None   . No past surgeries     Family History  Problem Relation Age of Onset  . Post-traumatic stress disorder Father   . Alcohol abuse Father   . Drug abuse Father   . Diabetes Maternal Grandmother   . Cancer Paternal Grandmother     Great-Grandparent  . Cancer Paternal Grandfather     Great-Gradnparent    History  Substance Use Topics  . Smoking status: Never Smoker   . Smokeless tobacco: Never Used  . Alcohol Use: No     Comment: wine occasionally- last was in Novemeber 2012    OB History    Grav Para Term Preterm Abortions TAB SAB Ect Mult Living   14 4 4  10 9 1   4       Review of Systems  Constitutional: Negative.   Genitourinary: Negative for dysuria, urgency, frequency, vaginal bleeding, vaginal discharge and vaginal pain.    Allergies  Review of patient's allergies indicates no known allergies.  Home Medications   Current Outpatient Rx  Name  Route  Sig  Dispense  Refill  . PHENTERMINE HCL 37.5 MG PO CAPS   Oral   Take 37.5 mg by mouth every morning.         . CEPHALEXIN 500 MG PO CAPS   Oral   Take 1 capsule (500 mg total) by mouth 4 (four) times daily. Take all of medicine and  drink lots of fluids   20 capsule   0   . CLINDAMYCIN HCL 300 MG PO CAPS   Oral   Take 300 mg by mouth 4 (four) times daily.         Marland Kitchen PRENATAL MULTIVITAMIN CH   Oral   Take 1 tablet by mouth every morning.         Marland Kitchen SERTRALINE HCL 50 MG PO TABS   Oral   Take 1 tablet (50 mg total) by mouth daily.   30 tablet   3     BP 128/82  Pulse 78  Temp 98.1 F (36.7 C) (Oral)  Resp 18  SpO2 100%  Breastfeeding? No  Physical Exam  Nursing note and vitals reviewed. Constitutional: She is oriented to person, place, and time. She appears well-developed and well-nourished.  Abdominal: Soft. Bowel sounds are normal. She exhibits no distension. There is no tenderness.  Neurological: She is alert and oriented to person, place, and time.  Skin: Skin is warm and dry.  Psychiatric: She has a normal mood and affect.    ED Course  Procedures (including critical care time)  Labs Reviewed  POCT URINALYSIS DIP (DEVICE) - Abnormal; Notable for  the following:    Ketones, ur 40 (*)     Hgb urine dipstick SMALL (*)     Nitrite POSITIVE (*)     All other components within normal limits  POCT PREGNANCY, URINE   No results found.   1. UTI (lower urinary tract infection)       MDM  U/a abnl.        Linna Hoff, MD 05/07/12 [phone]

## 2012-05-07 NOTE — ED Notes (Signed)
Pt c/o poss UTI x1 week.  Sx include: dark/cloudy urine w/a foul odor to it.  Denies: dysuria, vag discharge, abd/back pain, frequency and urgency. She is alert w/no signs of acute distress.

## 2012-05-13 ENCOUNTER — Emergency Department (HOSPITAL_COMMUNITY)
Admission: EM | Admit: 2012-05-13 | Discharge: 2012-05-13 | Disposition: A | Discharge status: Home / Self Care | Payer: Medicaid Other | Source: Home / Self Care

## 2012-05-13 ENCOUNTER — Encounter (HOSPITAL_COMMUNITY): Payer: Self-pay | Admitting: *Deleted

## 2012-05-13 DIAGNOSIS — M549 Dorsalgia, unspecified: Secondary | ICD-10-CM

## 2012-05-13 DIAGNOSIS — H65199 Other acute nonsuppurative otitis media, unspecified ear: Secondary | ICD-10-CM

## 2012-05-13 LAB — POCT URINALYSIS DIP (DEVICE)
Leukocytes, UA: NEGATIVE
Protein, ur: NEGATIVE mg/dL
Urobilinogen, UA: 0.2 mg/dL (ref 0.0–1.0)

## 2012-05-13 LAB — POCT RAPID STREP A: Streptococcus, Group A Screen (Direct): NEGATIVE

## 2012-05-13 NOTE — ED Provider Notes (Signed)
History     CSN: [id number]  Arrival date & time 05/13/12  1102   None     Chief Complaint  Patient presents with  . Otalgia  . Back Pain    (Consider location/radiation/quality/duration/timing/severity/associated sxs/prior treatment) Patient is a 32 y.o. female presenting with ear pain and back pain. The history is provided by the patient.  Otalgia This is a new problem. The current episode started 2 days ago. There is pain in the left ear. The problem occurs daily. The problem has not changed since onset.There has been no fever. The pain is mild. Associated symptoms include sore throat. Past medical history comments: vertigo.  Back Pain  This is a new problem. The current episode started 6 to 12 hours ago. The problem occurs constantly. The problem has not changed since onset.The pain is associated with no known injury. The pain is present in the lumbar spine. The quality of the pain is described as aching. The pain does not radiate. The pain is at a severity of 3/10. The symptoms are aggravated by bending and certain positions. She has tried nothing for the symptoms.  Pt reports she has taken her allegra d for ear pain with no relief in symptoms.    Pt additionally would like repeat ua to follow up on uti dx 5 days, no further symptoms of uti. Patient's last menstrual period was 05/13/2012.   Past Medical History  Diagnosis Date  . Anxiety     PTSD  . Headache     Migraines  . Arrhythmia   . HSV (herpes simplex virus) infection     no outbreak during current pregnancy    Past Surgical History  Procedure Date  . None   . No past surgeries     Family History  Problem Relation Age of Onset  . Post-traumatic stress disorder Father   . Alcohol abuse Father   . Drug abuse Father   . Diabetes Maternal Grandmother   . Cancer Paternal Grandmother     Great-Grandparent  . Cancer Paternal Grandfather     Great-Gradnparent    History  Substance Use Topics  . Smoking  status: Never Smoker   . Smokeless tobacco: Never Used  . Alcohol Use: No     Comment: wine occasionally- last was in Novemeber 2012    OB History    Grav Para Term Preterm Abortions TAB SAB Ect Mult Living   14 4 4  10 9 1   4       Review of Systems  HENT: Positive for ear pain and sore throat.   Musculoskeletal: Positive for back pain.  Neurological: Positive for dizziness.  All other systems reviewed and are negative.    Allergies  Review of patient's allergies indicates no known allergies.  Home Medications   Current Outpatient Rx  Name  Route  Sig  Dispense  Refill  . PHENTERMINE HCL 37.5 MG PO CAPS   Oral   Take 37.5 mg by mouth every morning.         Marland Kitchen PRENATAL MULTIVITAMIN CH   Oral   Take 1 tablet by mouth every morning.         Marland Kitchen SERTRALINE HCL 50 MG PO TABS   Oral   Take 1 tablet (50 mg total) by mouth daily.   30 tablet   3     BP 127/92  Pulse 80  Temp 98.6 F (37 C) (Oral)  Resp 18  SpO2 96%  LMP 05/13/2012  Physical Exam  Nursing note and vitals reviewed. Constitutional: She is oriented to person, place, and time. Vital signs are normal. She appears well-developed and well-nourished. She is active and cooperative.  HENT:  Head: Normocephalic.  Right Ear: Tympanic membrane and external ear normal.  Left Ear: Tympanic membrane and external ear normal.  Nose: Nose normal. Right sinus exhibits no maxillary sinus tenderness and no frontal sinus tenderness. Left sinus exhibits no maxillary sinus tenderness and no frontal sinus tenderness.  Mouth/Throat: Uvula is midline, oropharynx is clear and moist and mucous membranes are normal. No oropharyngeal exudate.  Eyes: Conjunctivae normal and EOM are normal. Pupils are equal, round, and reactive to light. No scleral icterus.  Neck: Trachea normal, normal range of motion and full passive range of motion without pain. Neck supple. No spinous process tenderness and no muscular tenderness present.    Cardiovascular: Normal rate, regular rhythm, normal heart sounds, intact distal pulses and normal pulses.   Pulmonary/Chest: Effort normal and breath sounds normal.  Abdominal: Soft. Bowel sounds are normal. There is no tenderness.  Musculoskeletal:       Cervical back: Normal.       Thoracic back: Normal.       Lumbar back: Normal.  Lymphadenopathy:       Head (right side): No submental, no submandibular, no tonsillar, no preauricular, no posterior auricular and no occipital adenopathy present.       Head (left side): No submental, no submandibular, no tonsillar, no preauricular, no posterior auricular and no occipital adenopathy present.    She has no cervical adenopathy.  Neurological: She is alert and oriented to person, place, and time. She has normal strength and normal reflexes. No cranial nerve deficit or sensory deficit. Coordination and gait normal. GCS eye subscore is 4. GCS verbal subscore is 5. GCS motor subscore is 6.  Skin: Skin is warm, dry and intact. No rash noted.  Psychiatric: She has a normal mood and affect. Her speech is normal and behavior is normal. Judgment and thought content normal. Cognition and memory are normal.    ED Course  Procedures (including critical care time)  Labs Reviewed  POCT URINALYSIS DIP (DEVICE) - Abnormal; Notable for the following:    Hgb urine dipstick LARGE (*)     All other components within normal limits  POCT RAPID STREP A ([facility] ONLY)   No results found.   1. Acute middle ear effusion   2. Back pain       MDM  Increase fluids, continue your allegra d and meclizine as discussed.  You do not have signs of uti today.  NSAID (aleve) as needed for back pain.  Heat therapy recommended, back exercises as provided.  Follow up with pcp if symptoms are not improved.          Johnsie Kindred, NP 05/13/12 1146

## 2012-05-13 NOTE — ED Provider Notes (Signed)
Medical screening examination/treatment/procedure(s) were performed by non-physician practitioner and as supervising physician I was immediately available for consultation/collaboration.  Leslee Home, M.D.   Reuben Likes, MD 05/13/12 [phone]

## 2012-05-13 NOTE — ED Notes (Signed)
Patient complains of left ear pain and vertigo, sore thorat x 2 days. Back pain x 2 days after finishing antibiotics for bladder infection. Patient also states that she has had vertigo for x 1 week. Denies nausea, vomiting, diarrhea, fever/chills.

## 2013-03-25 ENCOUNTER — Emergency Department (HOSPITAL_COMMUNITY)
Admission: EM | Admit: 2013-03-25 | Discharge: 2013-03-25 | Disposition: A | Discharge status: Home / Self Care | Payer: Medicaid Other | Source: Home / Self Care | Attending: Family Medicine | Admitting: Family Medicine

## 2013-03-25 ENCOUNTER — Encounter (HOSPITAL_COMMUNITY): Payer: Self-pay | Admitting: Emergency Medicine

## 2013-03-25 DIAGNOSIS — M791 Myalgia, unspecified site: Secondary | ICD-10-CM

## 2013-03-25 DIAGNOSIS — IMO0001 Reserved for inherently not codable concepts without codable children: Secondary | ICD-10-CM

## 2013-03-25 DIAGNOSIS — N390 Urinary tract infection, site not specified: Secondary | ICD-10-CM

## 2013-03-25 LAB — CBC
MCV: 81.1 fL (ref 78.0–100.0)
Platelets: 268 10*3/uL (ref 150–400)
RDW: 14.2 % (ref 11.5–15.5)
WBC: 7.7 10*3/uL (ref 4.0–10.5)

## 2013-03-25 LAB — COMPREHENSIVE METABOLIC PANEL
AST: 14 U/L (ref 0–37)
Albumin: 4.2 g/dL (ref 3.5–5.2)
Chloride: 103 mEq/L (ref 96–112)
Creatinine, Ser: 0.7 mg/dL (ref 0.50–1.10)
Total Bilirubin: 0.2 mg/dL — ABNORMAL LOW (ref 0.3–1.2)
Total Protein: 8.4 g/dL — ABNORMAL HIGH (ref 6.0–8.3)

## 2013-03-25 LAB — POCT URINALYSIS DIP (DEVICE)
Glucose, UA: NEGATIVE mg/dL
Nitrite: POSITIVE — AB
Urobilinogen, UA: 1 mg/dL (ref 0.0–1.0)

## 2013-03-25 LAB — POCT PREGNANCY, URINE: Preg Test, Ur: NEGATIVE

## 2013-03-25 LAB — SEDIMENTATION RATE: Sed Rate: 3 mm/hr (ref 0–22)

## 2013-03-25 MED ORDER — CEPHALEXIN 500 MG PO CAPS: 500.0000 mg | ORAL_CAPSULE | Freq: Two times a day (BID) | ORAL | Status: DC

## 2013-03-25 NOTE — ED Provider Notes (Signed)
Maria Ho is a 32 y.o. female who presents to Urgent Care today for  1) UTI. Patient has dysuria urinary frequency urgency associated with cloudy foul-smelling urine for the past month. She denies significant abdominal pain. She's not tried any medications yet. No vaginal discharge. 2) bilateral shoulder and trapezius pain associated with bilateral lateral hip pain. This is been present for about one week. Patient denies any pain radiating below the level of the elbow. She denies any numbness. She notes that she has a history of pain with prolonged overhand motion. For example she has pain and difficulty holding a hair dryer above her head. She denies any weakness with standing. She denies any bowel bladder dysfunction or difficulty walking.   Past Medical History  Diagnosis Date  . Anxiety     PTSD  . Headache(784.0)     Migraines  . Arrhythmia   . HSV (herpes simplex virus) infection     no outbreak during current pregnancy   History  Substance Use Topics  . Smoking status: Never Smoker   . Smokeless tobacco: Never Used  . Alcohol Use: No     Comment: wine occasionally- last was in Novemeber 2012   ROS as above Medications reviewed. No current facility-administered medications for this encounter.   Current Outpatient Prescriptions  Medication Sig Dispense Refill  . cephALEXin (KEFLEX) 500 MG capsule Take 1 capsule (500 mg total) by mouth 2 (two) times daily.  14 capsule  0  . phentermine 37.5 MG capsule Take 37.5 mg by mouth every morning.      . Prenatal Vit-Fe Fumarate-FA (PRENATAL MULTIVITAMIN) TABS Take 1 tablet by mouth every morning.      . sertraline (ZOLOFT) 50 MG tablet Take 1 tablet (50 mg total) by mouth daily.  30 tablet  3    Exam:  BP 134/86  Pulse 83  Temp(Src) 98.5 F (36.9 C) (Oral)  Resp 20  SpO2 100% Gen: Well NAD HEENT: EOMI,  MMM Lungs: Normal work of breathing. CTABL Heart: RRR no MRG Abd: NABS, Soft. NT, ND, no CV angle tenderness to  percussion Exts: Non edematous BL  LE, warm and well perfused.  Skin: No rashes or nodules MSK: Neck nontender to spinal midline normal neck range of motion. Upper extremity strength is intact throughout. Lumbar spine nontender with normal back motion and strength. Extremity strength is intact bilaterally. Sensation is intact throughout  Results for orders placed during the hospital encounter of 03/25/13 (from the past 24 hour(s))  POCT URINALYSIS DIP (DEVICE)     Status: Abnormal   Collection Time    03/25/13 12:22 PM      Result Value Range   Glucose, UA NEGATIVE  NEGATIVE mg/dL   Bilirubin Urine SMALL (*) NEGATIVE   Ketones, ur 15 (*) NEGATIVE mg/dL   Specific Gravity, Urine 1.025  1.005 - 1.030   Hgb urine dipstick LARGE (*) NEGATIVE   pH 6.5  5.0 - 8.0   Protein, ur 30 (*) NEGATIVE mg/dL   Urobilinogen, UA 1.0  0.0 - 1.0 mg/dL   Nitrite POSITIVE (*) NEGATIVE   Leukocytes, UA SMALL (*) NEGATIVE  POCT PREGNANCY, URINE     Status: None   Collection Time    03/25/13 12:24 PM      Result Value Range   Preg Test, Ur NEGATIVE  NEGATIVE   No results found.  Assessment and Plan: 32 y.o. female with  1) UTI. Plan to treat with Keflex. Followup as needed. 2) muscle  pain: Unclear etiology. Musculoskeletal in nature however there is a remote possibility of polymyositis.  Plan to obtain labs. CBC, CMP, sedimentation rate, CK, ANA, are pending. Will call patient if significantly abnormal. Discussed warning signs or symptoms. Please see discharge instructions. Patient expresses understanding.      Rodolph Bong, MD 03/25/13 [phone]

## 2013-03-25 NOTE — ED Notes (Signed)
C/o cloudy/foul smelling urine and incontence. Symptoms present x 1 month.  Pt is also c/o of waves of pain through out body that has been on going.  Unsure of what is causing pain.

## 2013-03-25 NOTE — ED Notes (Signed)
Call back number verified.  

## 2013-03-26 LAB — ANA: Anti Nuclear Antibody(ANA): NEGATIVE

## 2013-04-09 ENCOUNTER — Emergency Department (HOSPITAL_COMMUNITY)
Admission: EM | Admit: 2013-04-09 | Discharge: 2013-04-09 | Disposition: A | Discharge status: Home / Self Care | Payer: Medicaid Other | Source: Home / Self Care | Attending: Family Medicine | Admitting: Family Medicine

## 2013-04-09 ENCOUNTER — Other Ambulatory Visit (HOSPITAL_COMMUNITY)
Admission: RE | Admit: 2013-04-09 | Discharge: 2013-04-09 | Disposition: A | Discharge status: Home / Self Care | Payer: Medicaid Other | Source: Ambulatory Visit | Attending: Family Medicine | Admitting: Family Medicine

## 2013-04-09 ENCOUNTER — Encounter (HOSPITAL_COMMUNITY): Payer: Self-pay | Admitting: Emergency Medicine

## 2013-04-09 DIAGNOSIS — N760 Acute vaginitis: Secondary | ICD-10-CM | POA: Insufficient documentation

## 2013-04-09 DIAGNOSIS — Z113 Encounter for screening for infections with a predominantly sexual mode of transmission: Secondary | ICD-10-CM | POA: Insufficient documentation

## 2013-04-09 DIAGNOSIS — N72 Inflammatory disease of cervix uteri: Secondary | ICD-10-CM

## 2013-04-09 LAB — POCT PREGNANCY, URINE: Preg Test, Ur: NEGATIVE

## 2013-04-09 LAB — POCT URINALYSIS DIP (DEVICE)
Bilirubin Urine: NEGATIVE
Glucose, UA: NEGATIVE mg/dL
Ketones, ur: NEGATIVE mg/dL
pH: 7 (ref 5.0–8.0)

## 2013-04-09 LAB — RPR: RPR Ser Ql: NONREACTIVE

## 2013-04-09 LAB — HIV ANTIBODY (ROUTINE TESTING W REFLEX): HIV: NONREACTIVE

## 2013-04-09 MED ORDER — AZITHROMYCIN 250 MG PO TABS
1000.0000 mg | ORAL_TABLET | Freq: Every day | ORAL | Status: AC
  Administered 2013-04-09: 1000 mg via ORAL

## 2013-04-09 MED ORDER — METRONIDAZOLE 0.75 % VA GEL: 1.0000 | Freq: Every day | VAGINAL | Status: DC

## 2013-04-09 MED ORDER — CEFTRIAXONE SODIUM 250 MG IJ SOLR
INTRAMUSCULAR | Status: AC
  Filled 2013-04-09: qty 250

## 2013-04-09 MED ORDER — CEFTRIAXONE SODIUM 250 MG IJ SOLR
250.0000 mg | Freq: Once | INTRAMUSCULAR | Status: AC
  Administered 2013-04-09: 250 mg via INTRAMUSCULAR

## 2013-04-09 MED ORDER — AZITHROMYCIN 250 MG PO TABS
ORAL_TABLET | ORAL | Status: AC
  Filled 2013-04-09: qty 4

## 2013-04-09 MED ORDER — LIDOCAINE HCL (PF) 1 % IJ SOLN
INTRAMUSCULAR | Status: AC
  Filled 2013-04-09: qty 5

## 2013-04-09 NOTE — ED Provider Notes (Signed)
CSN: [id number]     Arrival date & time 04/09/13  1037 History   First MD Initiated Contact with Patient 04/09/13 1126     Chief Complaint  Patient presents with  . Abdominal Pain  . Sore Throat    white patches on tonsils denies pain.   . Vaginal Discharge   (Consider location/radiation/quality/duration/timing/severity/associated sxs/prior Treatment) HPI Comments: LNMP: patient has IUD, therefore only has occasional bleeding. States she recently completed about a 7-10 days of vaginal bleeding/spotting and noticed discharge 3 days ago when bleeding stopped. [id number]  Patient is a 32 y.o. female presenting with vaginal discharge. The history is provided by the patient.  Vaginal Discharge Quality:  Manson Passey Severity:  Moderate Onset quality:  Gradual Duration:  3 days Timing:  Constant Progression:  Unchanged Chronicity:  New Relieved by:  None tried Worsened by:  Nothing tried Ineffective treatments:  None tried Associated symptoms: no dyspareunia and no dysuria   Associated symptoms comment:  Pelvic "pressure". Risk factors: unprotected sex     Past Medical History  Diagnosis Date  . Anxiety     PTSD  . Headache(784.0)     Migraines  . Arrhythmia   . HSV (herpes simplex virus) infection     no outbreak during current pregnancy   Past Surgical History  Procedure Laterality Date  . None    . No past surgeries     Family History  Problem Relation Age of Onset  . Post-traumatic stress disorder Father   . Alcohol abuse Father   . Drug abuse Father   . Diabetes Maternal Grandmother   . Cancer Paternal Grandmother     Great-Grandparent  . Cancer Paternal Grandfather     Great-Gradnparent   History  Substance Use Topics  . Smoking status: Never Smoker   . Smokeless tobacco: Never Used  . Alcohol Use: Yes     Comment: wine occasionally- last was in Novemeber 2012   OB History   Grav Para Term Preterm Abortions TAB SAB Ect Mult Living   14 4 4  10 9 1   4       Review of Systems  Constitutional: Negative.   HENT: Negative.   Eyes: Negative.   Respiratory: Negative.   Cardiovascular: Negative.   Gastrointestinal: Negative.   Endocrine: Negative for polydipsia, polyphagia and polyuria.  Genitourinary: Positive for vaginal discharge. Negative for dysuria, urgency, frequency, hematuria, flank pain, vaginal bleeding, difficulty urinating, genital sores and dyspareunia.       +patient describes pelvic "pressure"  Musculoskeletal: Negative.   Skin: Negative.   Allergic/Immunologic: Negative for immunocompromised state.  Neurological: Negative.   Psychiatric/Behavioral: Negative.     Allergies  Review of patient's allergies indicates no known allergies.  Home Medications   Current Outpatient Rx  Name  Route  Sig  Dispense  Refill  . cephALEXin (KEFLEX) 500 MG capsule   Oral   Take 1 capsule (500 mg total) by mouth 2 (two) times daily.   14 capsule   0   . metroNIDAZOLE (METROGEL VAGINAL) 0.75 % vaginal gel   Vaginal   Place 1 Applicatorful vaginally at bedtime. X 5 days   70 g   0   . phentermine 37.5 MG capsule   Oral   Take 37.5 mg by mouth every morning.         . Prenatal Vit-Fe Fumarate-FA (PRENATAL MULTIVITAMIN) TABS   Oral   Take 1 tablet by mouth every morning.         Marland Kitchen  EXPIRED: sertraline (ZOLOFT) 50 MG tablet   Oral   Take 1 tablet (50 mg total) by mouth daily.   30 tablet   3    BP 134/82  Pulse 100  Temp(Src) 98.5 F (36.9 C) (Oral)  Resp 20  SpO2 100%  LMP 04/01/2013  Breastfeeding? No Physical Exam  Nursing note and vitals reviewed. Constitutional: She is oriented to person, place, and time. She appears well-developed and well-nourished.  HENT:  Head: Normocephalic and atraumatic.  Mouth/Throat: Uvula is midline, oropharynx is clear and moist and mucous membranes are normal.  Eyes: Conjunctivae are normal.  Neck: Normal range of motion. Neck supple.  Cardiovascular: Normal rate, regular  rhythm and normal heart sounds.   Pulmonary/Chest: Effort normal and breath sounds normal.  Abdominal: Soft. Bowel sounds are normal. She exhibits no distension and no mass. There is no tenderness. There is no guarding.  Genitourinary: Uterus normal. There is no rash, tenderness or lesion on the right labia. There is no rash, tenderness or lesion on the left labia. Cervix exhibits motion tenderness and discharge. Cervix exhibits no friability. Right adnexum displays no mass, no tenderness and no fullness. Left adnexum displays no mass, no tenderness and no fullness. Vaginal discharge found.  No visible retained foreign body in vaginal vault, such as tampon, to explain discharge.  Musculoskeletal: Normal range of motion.  Lymphadenopathy:    She has no cervical adenopathy.  Neurological: She is alert and oriented to person, place, and time.  Skin: Skin is warm and dry. No rash noted.  Psychiatric: She has a normal mood and affect. Her behavior is normal.    ED Course  Procedures (including critical care time) Labs Review Labs Reviewed  POCT URINALYSIS DIP (DEVICE) - Abnormal; Notable for the following:    Hgb urine dipstick TRACE (*)    Protein, ur 30 (*)    Leukocytes, UA SMALL (*)    All other components within normal limits  RPR  HIV ANTIBODY (ROUTINE TESTING)  POCT PREGNANCY, URINE  CERVICOVAGINAL ANCILLARY ONLY  CERVICOVAGINAL ANCILLARY ONLY   Imaging Review No results found.  EKG Interpretation    Date/Time:    Ventricular Rate:    PR Interval:    QRS Duration:   QT Interval:    QTC Calculation:   R Axis:     Text Interpretation:              MDM   Exam suggests cervicitis. Because patient has minimal pain, can tolerate oral medication, is afebrile and has access to OB/GYN follow up (is an established patient with [facility]), will treat empirically as out patient for GC & Chlamydia & BV. Cautioned her that she needs to monitor her symptoms closely and  if she develops fever, increasing abdominal pain or pelvic pain she should seek immediate evaluation by her OB/GYN or at [facility] as her IUD would need to be removed.   OF note: while chart being completed, patient's pharmacy called to mention that electronic script sent for Metrogel vaginal was not covered by her insurance. Verbal orders given to pharmacist for Metronidazole 500mg  po BID x 7 days (#14, no refill).   Jess Barters [place], [place] 04/09/13 1327

## 2013-04-09 NOTE — ED Notes (Signed)
C/o lower abdominal pain with brown discharge that has an odor.  Denies irritation on set 3 days ago. Voices no concerns of stds.  Also c/o white spots on tonsils causing bad breathe. Denies pain or any other symptoms.

## 2013-04-10 NOTE — ED Provider Notes (Signed)
Medical screening examination/treatment/procedure(s) were performed by a resident physician or non-physician practitioner and as the supervising physician I was immediately available for consultation/collaboration.  Shirel Mallis, MD    Bane Hagy S Bexley Mclester, MD 04/10/13 0802 

## 2013-04-10 NOTE — ED Notes (Addendum)
GC/Chlamydia neg., Affirm: Candida and Trich neg., Gardnerella pos., HIV/RPR non-reactive. Pt. adequately treated with Metrogel. Maria Ho 04/10/2013

## 2013-05-06 DIAGNOSIS — F419 Anxiety disorder, unspecified: Secondary | ICD-10-CM | POA: Insufficient documentation

## 2013-05-06 DIAGNOSIS — A6000 Herpesviral infection of urogenital system, unspecified: Secondary | ICD-10-CM | POA: Insufficient documentation

## 2013-05-06 DIAGNOSIS — L309 Dermatitis, unspecified: Secondary | ICD-10-CM | POA: Insufficient documentation

## 2013-05-06 DIAGNOSIS — B002 Herpesviral gingivostomatitis and pharyngotonsillitis: Secondary | ICD-10-CM | POA: Insufficient documentation

## 2013-05-06 DIAGNOSIS — G4700 Insomnia, unspecified: Secondary | ICD-10-CM | POA: Insufficient documentation

## 2013-06-03 ENCOUNTER — Encounter: Payer: Self-pay | Admitting: Advanced Practice Midwife

## 2013-06-18 ENCOUNTER — Ambulatory Visit: Payer: Self-pay | Admitting: Advanced Practice Midwife

## 2013-07-16 ENCOUNTER — Ambulatory Visit: Payer: Medicaid Other | Admitting: Advanced Practice Midwife

## 2013-07-30 ENCOUNTER — Ambulatory Visit: Payer: Medicaid Other | Admitting: Advanced Practice Midwife

## 2013-08-16 ENCOUNTER — Encounter (HOSPITAL_BASED_OUTPATIENT_CLINIC_OR_DEPARTMENT_OTHER): Payer: Self-pay | Admitting: Emergency Medicine

## 2013-08-16 ENCOUNTER — Emergency Department (HOSPITAL_BASED_OUTPATIENT_CLINIC_OR_DEPARTMENT_OTHER)
Admission: EM | Admit: 2013-08-16 | Discharge: 2013-08-16 | Disposition: A | Discharge status: Home / Self Care | Payer: Medicaid Other | Attending: Emergency Medicine | Admitting: Emergency Medicine

## 2013-08-16 DIAGNOSIS — F411 Generalized anxiety disorder: Secondary | ICD-10-CM | POA: Insufficient documentation

## 2013-08-16 DIAGNOSIS — R51 Headache: Secondary | ICD-10-CM

## 2013-08-16 DIAGNOSIS — F4310 Post-traumatic stress disorder, unspecified: Secondary | ICD-10-CM | POA: Insufficient documentation

## 2013-08-16 DIAGNOSIS — Z79899 Other long term (current) drug therapy: Secondary | ICD-10-CM | POA: Insufficient documentation

## 2013-08-16 DIAGNOSIS — Z8679 Personal history of other diseases of the circulatory system: Secondary | ICD-10-CM | POA: Insufficient documentation

## 2013-08-16 DIAGNOSIS — G43909 Migraine, unspecified, not intractable, without status migrainosus: Secondary | ICD-10-CM | POA: Insufficient documentation

## 2013-08-16 DIAGNOSIS — R519 Headache, unspecified: Secondary | ICD-10-CM

## 2013-08-16 DIAGNOSIS — Z8619 Personal history of other infectious and parasitic diseases: Secondary | ICD-10-CM | POA: Insufficient documentation

## 2013-08-16 HISTORY — DX: Migraine, unspecified, not intractable, without status migrainosus: G43.909

## 2013-08-16 MED ORDER — KETOROLAC TROMETHAMINE 60 MG/2ML IM SOLN
60.0000 mg | Freq: Once | INTRAMUSCULAR | Status: AC
  Administered 2013-08-16: 60 mg via INTRAMUSCULAR
  Filled 2013-08-16: qty 2

## 2013-08-16 MED ORDER — DIPHENHYDRAMINE HCL 50 MG/ML IJ SOLN
25.0000 mg | Freq: Once | INTRAMUSCULAR | Status: AC
  Administered 2013-08-16: 25 mg via INTRAMUSCULAR
  Filled 2013-08-16: qty 1

## 2013-08-16 MED ORDER — METOCLOPRAMIDE HCL 5 MG/ML IJ SOLN
10.0000 mg | Freq: Once | INTRAMUSCULAR | Status: AC
  Administered 2013-08-16: 10 mg via INTRAMUSCULAR
  Filled 2013-08-16: qty 2

## 2013-08-16 MED ORDER — DIPHENHYDRAMINE HCL 50 MG/ML IJ SOLN
25.0000 mg | Freq: Once | INTRAMUSCULAR | Status: DC
Start: 2013-08-16 — End: 2013-08-16

## 2013-08-16 NOTE — ED Notes (Signed)
C/o "migraine" since 8am

## 2013-08-16 NOTE — Discharge Instructions (Signed)

## 2013-08-16 NOTE — ED Notes (Signed)
C/o ha onset 0800 this am  Denies n/v

## 2013-08-16 NOTE — ED Provider Notes (Signed)
Medical screening examination/treatment/procedure(s) were performed by non-physician practitioner and as supervising physician I was immediately available for consultation/collaboration.   EKG Interpretation None        Osvaldo Shipper, MD 08/16/13 2311

## 2013-08-16 NOTE — ED Provider Notes (Signed)
CSN: [id number]     Arrival date & time 08/16/13  2109 History   First MD Initiated Contact with Patient 08/16/13 2120     Chief Complaint  Patient presents with  . Migraine     (Consider location/radiation/quality/duration/timing/severity/associated sxs/prior Treatment) Patient is a 33 y.o. female presenting with migraines. The history is provided by the patient. No language interpreter was used.  Migraine This is a new problem. The current episode started today. The problem occurs constantly. The problem has been gradually worsening. Pertinent negatives include no abdominal pain or fever. Nothing aggravates the symptoms. She has tried acetaminophen and NSAIDs for the symptoms. The treatment provided no relief.    Past Medical History  Diagnosis Date  . Anxiety     PTSD  . Headache(784.0)     Migraines  . Arrhythmia   . HSV (herpes simplex virus) infection     no outbreak during current pregnancy  . Migraine    Past Surgical History  Procedure Laterality Date  . None    . No past surgeries     Family History  Problem Relation Age of Onset  . Post-traumatic stress disorder Father   . Alcohol abuse Father   . Drug abuse Father   . Diabetes Maternal Grandmother   . Cancer Paternal Grandmother     Great-Grandparent  . Cancer Paternal Grandfather     Great-Gradnparent   History  Substance Use Topics  . Smoking status: Never Smoker   . Smokeless tobacco: Never Used  . Alcohol Use: Yes   OB History   Grav Para Term Preterm Abortions TAB SAB Ect Mult Living   14 4 4  10 9 1   4      Review of Systems  Constitutional: Negative for fever.  Gastrointestinal: Negative for abdominal pain.  All other systems reviewed and are negative.     Allergies  Review of patient's allergies indicates no known allergies.  Home Medications   Prior to Admission medications   Medication Sig Start Date End Date Taking? Authorizing Provider  GABAPENTIN PO Take by mouth.   Yes  Historical Provider, MD  UNKNOWN TO PATIENT BCP to control vaginal bleeding   Yes Historical Provider, MD  cephALEXin (KEFLEX) 500 MG capsule Take 1 capsule (500 mg total) by mouth 2 (two) times daily. 03/25/13   Gregor Hams, MD  metroNIDAZOLE (METROGEL VAGINAL) 0.75 % vaginal gel Place 1 Applicatorful vaginally at bedtime. X 5 days 04/09/13   Annett Gula Presson, PA  phentermine 37.5 MG capsule Take 37.5 mg by mouth every morning.    Historical Provider, MD  Prenatal Vit-Fe Fumarate-FA (PRENATAL MULTIVITAMIN) TABS Take 1 tablet by mouth every morning.    Historical Provider, MD  sertraline (ZOLOFT) 50 MG tablet Take 1 tablet (50 mg total) by mouth daily. 12/08/11 12/07/12  Logan Bores, MD   BP 143/94  Pulse 88  Temp(Src) 98.5 F (36.9 C) (Oral)  Resp 16  Ht 5\' 9"  (1.753 m)  Wt 205 lb (92.987 kg)  BMI 30.26 kg/m2  SpO2 100%  LMP 08/14/2013 Physical Exam  Nursing note and vitals reviewed. Constitutional: She is oriented to person, place, and time. She appears well-developed and well-nourished.  HENT:  Head: Normocephalic.  Right Ear: External ear normal.  Nose: Nose normal.  Mouth/Throat: Oropharynx is clear and moist.  Eyes: Conjunctivae and EOM are normal. Pupils are equal, round, and reactive to light.  Neck: Normal range of motion.  Cardiovascular: Normal rate.   Pulmonary/Chest:  Effort normal and breath sounds normal.  Abdominal: Soft.  Musculoskeletal: Normal range of motion.  Neurological: She is alert and oriented to person, place, and time.  Skin: Skin is warm.  Psychiatric: She has a normal mood and affect.    ED Course  Procedures (including critical care time) Labs Review Labs Reviewed - No data to display  Imaging Review No results found.   EKG Interpretation None      MDM   Final diagnoses:  Headache    Pt given torodol, benadryl and reglan IM    Fransico Meadow, PA-C 08/16/13 2159

## 2013-09-06 IMAGING — US US OB TRANSVAGINAL
1 series · 13 of 28 positions shown · non-contrast
Comparison: Obstetrical ultrasound 03/02/2010.

CLINICAL DATA: First trimester pregnancy with pain and spotting.
LMP 02/28/2011.  Beta HCG level 7507.

OBSTETRIC <14 WK US AND TRANSVAGINAL OB US
TECHNIQUE: Both transabdominal and transvaginal ultrasound
examinations were performed for complete evaluation of the
gestation as well as the maternal uterus, adnexal regions, and
pelvic cul-de-sac.  Transvaginal technique was performed to assess
early pregnancy.

[Series 1: us ob comp less 14 wks · 48 acquisitions, 13 frames shown]
[im 2/48]
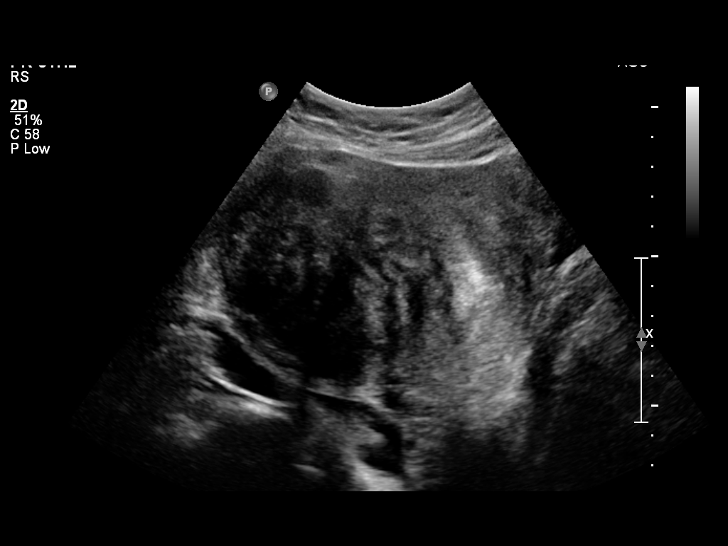
[im 6/48]
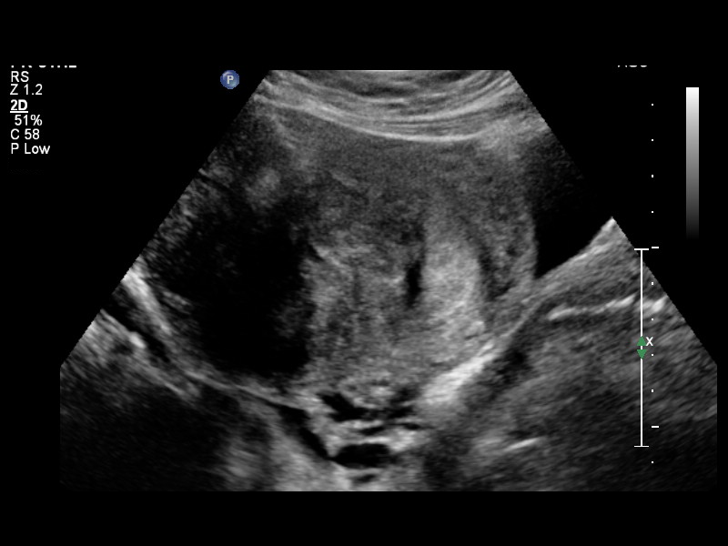
[im 9/48]
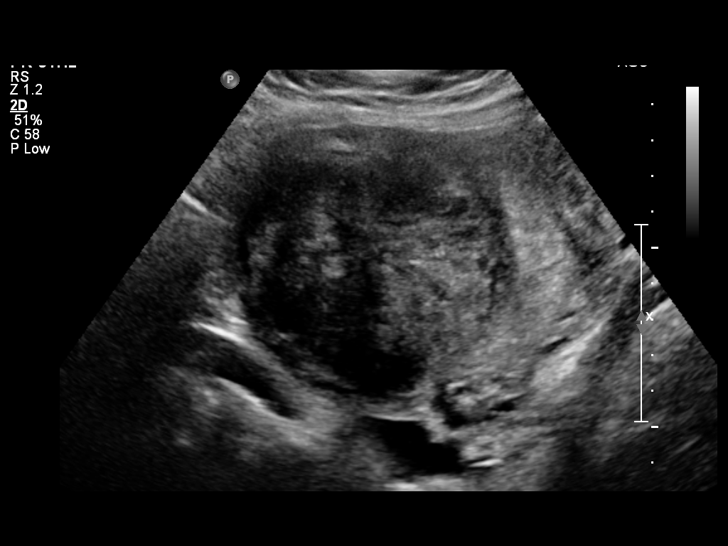
[im 13/48]
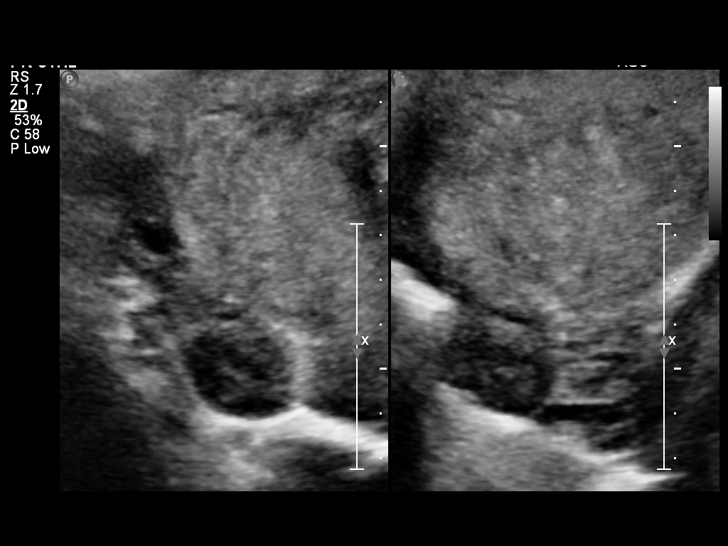
[im 16/48]
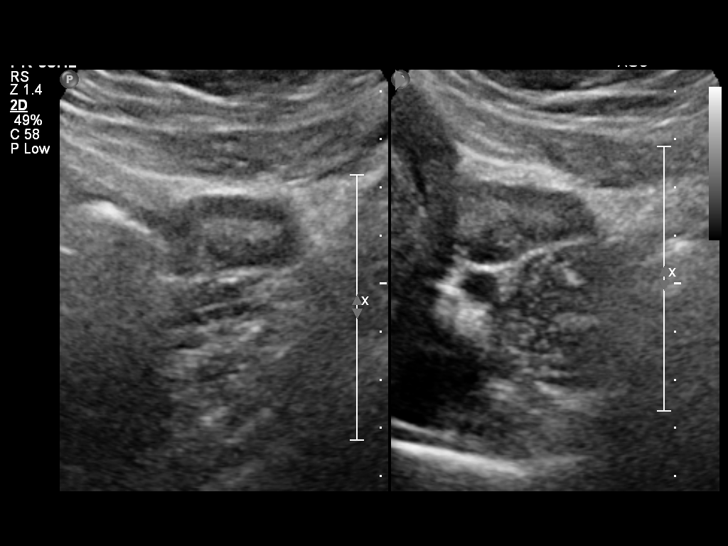
[im 20/48]
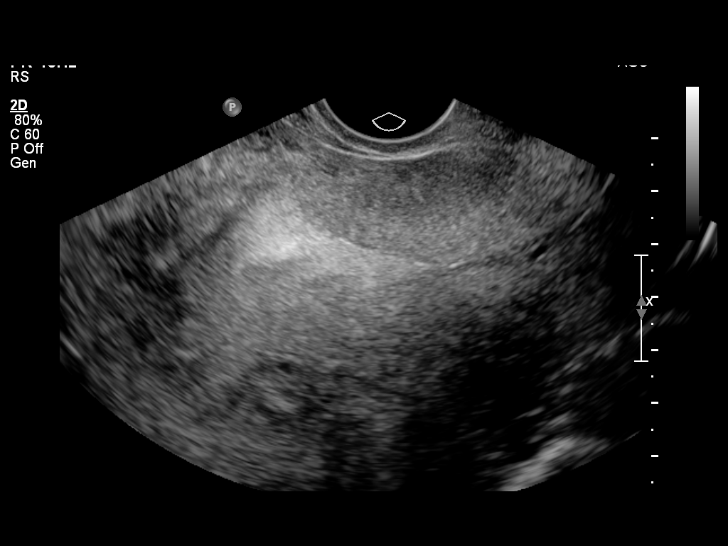
[im 25/48]
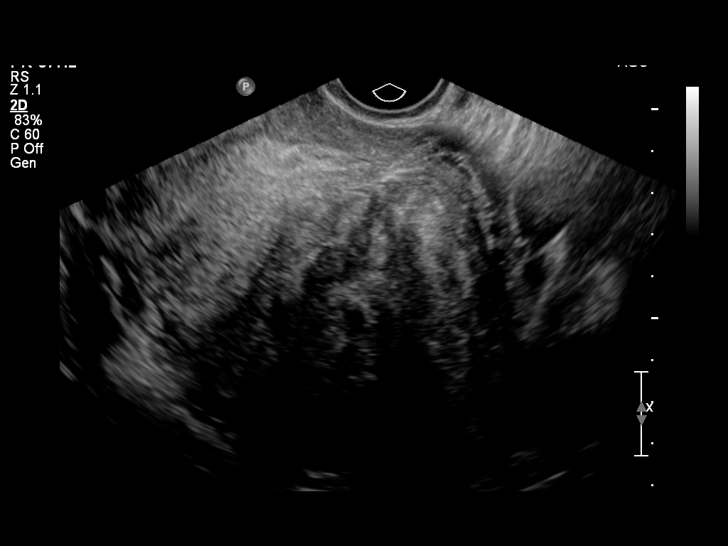
[im 28/48]
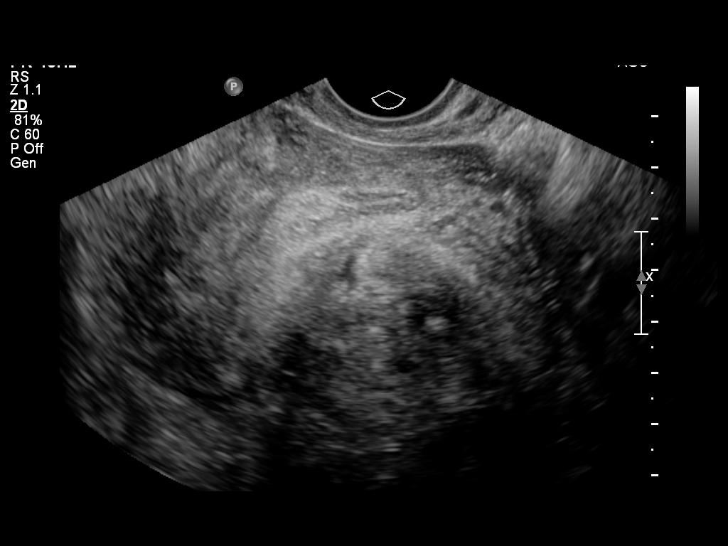
[im 32/48]
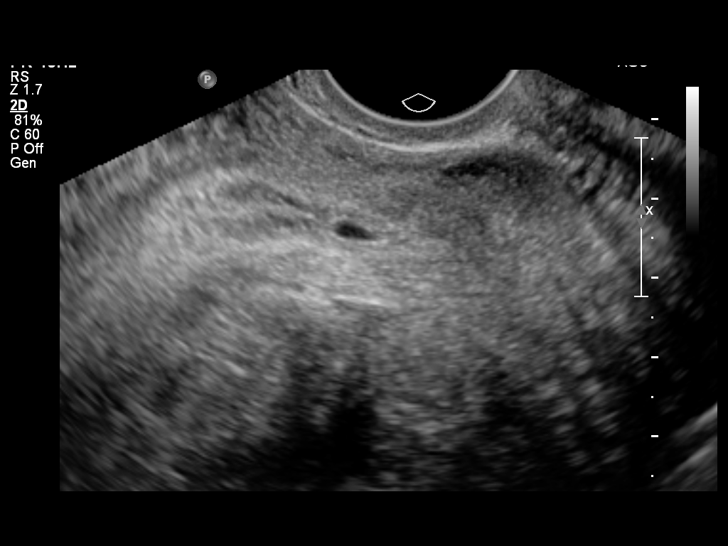
[im 35/48]
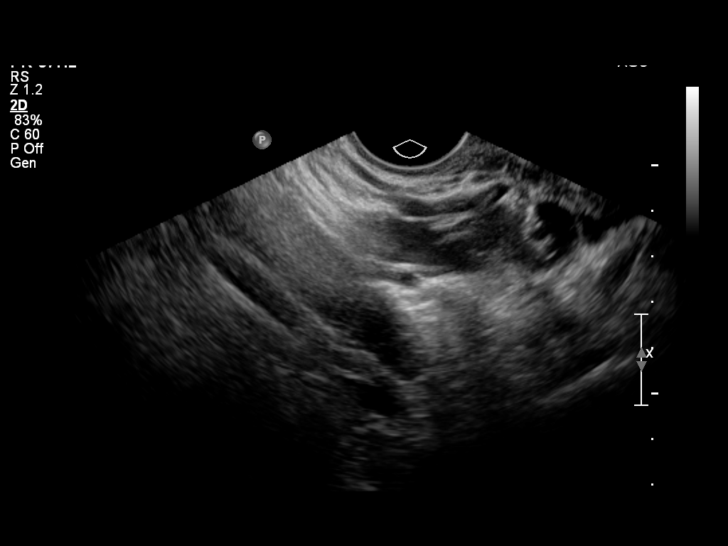
[im 39/48]
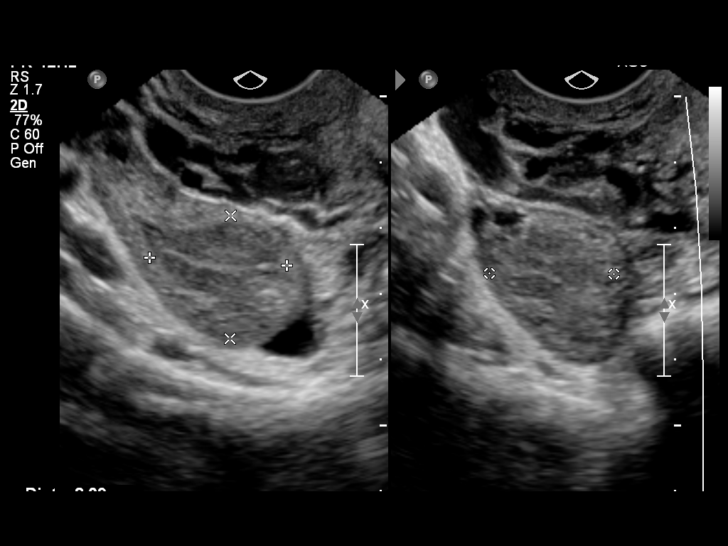
[im 42/48]
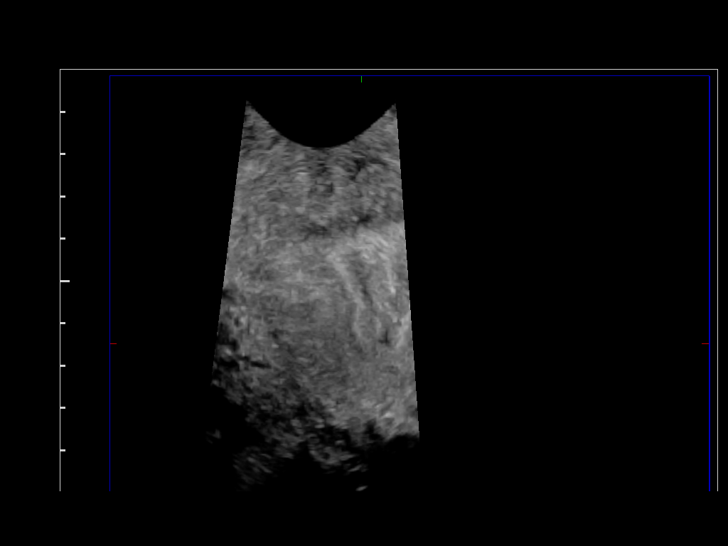
[im 46/48]
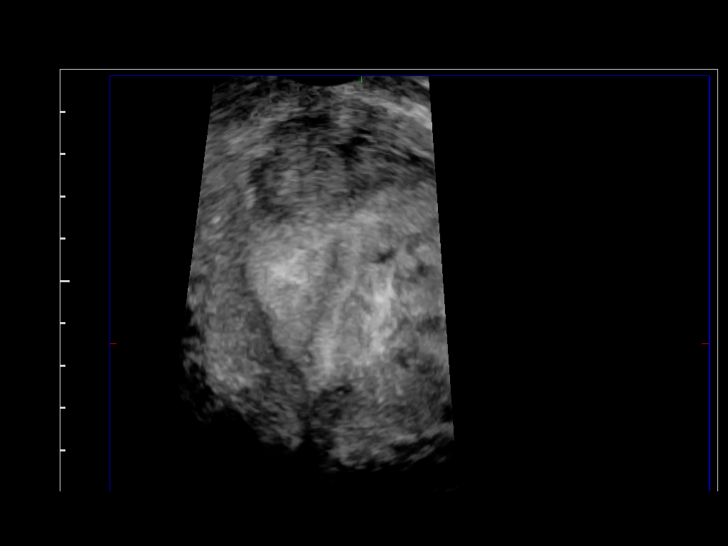

[13 of 28 positions shown; findings below may reference images not displayed]

Intrauterine gestational sac:   There is a small cyst in the lower
uterine segment with a mean sac diameter of 3.3 mm.  If this is a
gestational sac, it corresponds with a gestational age of 4 weeks 6
days.
Yolk sac: None visualized.
Embryo: None visualized.

Maternal uterus/adnexae:
Again demonstrated is a large heterogeneous uterine fibroid
measuring 8.1 x 7.6 x 5.5 cm.  Both maternal ovaries are
visualized.  There is a probable small corpus luteal cyst on the
right measuring 2.1 cm.  No adnexal mass or free pelvic fluid
demonstrated.
IMPRESSION: 1.  Possible small gestational sac in the lower uterine segment
with a mean sac diameter 3.3 mm, corresponding with a gestational
age of 4 weeks 6 days.  No fetal pole is demonstrated.  A fetal
pole is not necessarily visualized this early in pregnancy, and the
findings may be due to early gestation.
2.  Ectopic pregnancy is not excluded.  Follow-up beta HCG levels
and consideration of follow-up ultrasound in 7-10 days are
recommended to assess fetal viability.
3.  Large uterine fibroid.

## 2013-12-12 ENCOUNTER — Telehealth: Payer: Self-pay | Admitting: *Deleted

## 2013-12-12 ENCOUNTER — Encounter (HOSPITAL_COMMUNITY): Payer: Self-pay | Admitting: Emergency Medicine

## 2013-12-12 ENCOUNTER — Emergency Department (HOSPITAL_COMMUNITY)
Admission: EM | Admit: 2013-12-12 | Discharge: 2013-12-12 | Disposition: A | Discharge status: Home / Self Care | Payer: Medicaid Other | Source: Home / Self Care

## 2013-12-12 DIAGNOSIS — G43909 Migraine, unspecified, not intractable, without status migrainosus: Secondary | ICD-10-CM

## 2013-12-12 MED ORDER — DIPHENHYDRAMINE HCL 50 MG/ML IJ SOLN: 50.0000 mg | Freq: Once | INTRAMUSCULAR | Status: DC

## 2013-12-12 MED ORDER — KETOROLAC TROMETHAMINE 60 MG/2ML IM SOLN: 60.0000 mg | Freq: Once | INTRAMUSCULAR | Status: DC

## 2013-12-12 MED ORDER — KETOROLAC TROMETHAMINE 60 MG/2ML IM SOLN
60.0000 mg | Freq: Once | INTRAMUSCULAR | Status: AC
  Administered 2013-12-12: 60 mg via INTRAMUSCULAR

## 2013-12-12 MED ORDER — DIPHENHYDRAMINE HCL 50 MG/ML IJ SOLN
INTRAMUSCULAR | Status: AC
  Filled 2013-12-12: qty 1

## 2013-12-12 MED ORDER — METHYLPREDNISOLONE SODIUM SUCC 125 MG IJ SOLR: 80.0000 mg | Freq: Once | INTRAMUSCULAR | Status: DC

## 2013-12-12 MED ORDER — SUMATRIPTAN SUCCINATE 6 MG/0.5ML ~~LOC~~ SOLN
6.0000 mg | Freq: Once | SUBCUTANEOUS | Status: AC
  Administered 2013-12-12: 6 mg via SUBCUTANEOUS

## 2013-12-12 MED ORDER — SUMATRIPTAN SUCCINATE 6 MG/0.5ML ~~LOC~~ SOLN
SUBCUTANEOUS | Status: AC
  Filled 2013-12-12: qty 0.5

## 2013-12-12 MED ORDER — METHYLPREDNISOLONE SODIUM SUCC 125 MG IJ SOLR
80.0000 mg | Freq: Once | INTRAMUSCULAR | Status: AC
  Administered 2013-12-12: 80 mg via INTRAMUSCULAR

## 2013-12-12 MED ORDER — METHYLPREDNISOLONE SODIUM SUCC 125 MG IJ SOLR
INTRAMUSCULAR | Status: AC
  Filled 2013-12-12: qty 2

## 2013-12-12 MED ORDER — KETOROLAC TROMETHAMINE 60 MG/2ML IM SOLN
INTRAMUSCULAR | Status: AC
  Filled 2013-12-12: qty 2

## 2013-12-12 MED ORDER — DIPHENHYDRAMINE HCL 50 MG/ML IJ SOLN
50.0000 mg | Freq: Once | INTRAMUSCULAR | Status: AC
  Administered 2013-12-12: 50 mg via INTRAMUSCULAR

## 2013-12-12 NOTE — Discharge Instructions (Signed)
Headaches, Frequently Asked Questions MIGRAINE HEADACHES Q: What is migraine? What causes it? How can I treat it? A: Generally, migraine headaches begin as a dull ache. Then they develop into a constant, throbbing, and pulsating pain. You may experience pain at the temples. You may experience pain at the front or back of one or both sides of the head. The pain is usually accompanied by a combination of:  Nausea.  Vomiting.  Sensitivity to light and noise. Some people (about 15%) experience an aura (see below) before an attack. The cause of migraine is believed to be chemical reactions in the brain. Treatment for migraine may include over-the-counter or prescription medications. It may also include self-help techniques. These include relaxation training and biofeedback.  Q: What is an aura? A: About 15% of people with migraine get an "aura". This is a sign of neurological symptoms that occur before a migraine headache. You may see wavy or jagged lines, dots, or flashing lights. You might experience tunnel vision or blind spots in one or both eyes. The aura can include visual or auditory hallucinations (something imagined). It may include disruptions in smell (such as strange odors), taste or touch. Other symptoms include:  Numbness.  A "pins and needles" sensation.  Difficulty in recalling or speaking the correct word. These neurological events may last as long as 60 minutes. These symptoms will fade as the headache begins. Q: What is a trigger? A: Certain physical or environmental factors can lead to or "trigger" a migraine. These include:  Foods.  Hormonal changes.  Weather.  Stress. It is important to remember that triggers are different for everyone. To help prevent migraine attacks, you need to figure out which triggers affect you. Keep a headache diary. This is a good way to track triggers. The diary will help you talk to your healthcare professional about your condition. Q: Does  weather affect migraines? A: Bright sunshine, hot, humid conditions, and drastic changes in barometric pressure may lead to, or "trigger," a migraine attack in some people. But studies have shown that weather does not act as a trigger for everyone with migraines. Q: What is the link between migraine and hormones? A: Hormones start and regulate many of your body's functions. Hormones keep your body in balance within a constantly changing environment. The levels of hormones in your body are unbalanced at times. Examples are during menstruation, pregnancy, or menopause. That can lead to a migraine attack. In fact, about three quarters of all women with migraine report that their attacks are related to the menstrual cycle.  Q: Is there an increased risk of stroke for migraine sufferers? A: The likelihood of a migraine attack causing a stroke is very remote. That is not to say that migraine sufferers cannot have a stroke associated with their migraines. In persons under age 53, the most common associated factor for stroke is migraine headache. But over the course of a person's normal life span, the occurrence of migraine headache may actually be associated with a reduced risk of dying from cerebrovascular disease due to stroke.  Q: What are acute medications for migraine? A: Acute medications are used to treat the pain of the headache after it has started. Examples over-the-counter medications, NSAIDs, ergots, and triptans.  Q: What are the triptans? A: Triptans are the newest class of abortive medications. They are specifically targeted to treat migraine. Triptans are vasoconstrictors. They moderate some chemical reactions in the brain. The triptans work on receptors in your brain. Triptans help  to restore the balance of a neurotransmitter called serotonin. Fluctuations in levels of serotonin are thought to be a main cause of migraine.  °Q: Are over-the-counter medications for migraine effective? °A:  Over-the-counter, or "OTC," medications may be effective in relieving mild to moderate pain and associated symptoms of migraine. But you should see your caregiver before beginning any treatment regimen for migraine.  °Q: What are preventive medications for migraine? °A: Preventive medications for migraine are sometimes referred to as "prophylactic" treatments. They are used to reduce the frequency, severity, and length of migraine attacks. Examples of preventive medications include antiepileptic medications, antidepressants, beta-blockers, calcium channel blockers, and NSAIDs (nonsteroidal anti-inflammatory drugs). °Q: Why are anticonvulsants used to treat migraine? °A: During the past few years, there has been an increased interest in antiepileptic drugs for the prevention of migraine. They are sometimes referred to as "anticonvulsants". Both epilepsy and migraine may be caused by similar reactions in the brain.  °Q: Why are antidepressants used to treat migraine? °A: Antidepressants are typically used to treat people with depression. They may reduce migraine frequency by regulating chemical levels, such as serotonin, in the brain.  °Q: What alternative therapies are used to treat migraine? °A: The term "alternative therapies" is often used to describe treatments considered outside the scope of conventional Western medicine. Examples of alternative therapy include acupuncture, acupressure, and yoga. Another common alternative treatment is herbal therapy. Some herbs are believed to relieve headache pain. Always discuss alternative therapies with your caregiver before proceeding. Some herbal products contain arsenic and other toxins. °TENSION HEADACHES °Q: What is a tension-type headache? What causes it? How can I treat it? °A: Tension-type headaches occur randomly. They are often the result of temporary stress, anxiety, fatigue, or anger. Symptoms include soreness in your temples, a tightening band-like sensation  around your head (a "vice-like" ache). Symptoms can also include a pulling feeling, pressure sensations, and contracting head and neck muscles. The headache begins in your forehead, temples, or the back of your head and neck. Treatment for tension-type headache may include over-the-counter or prescription medications. Treatment may also include self-help techniques such as relaxation training and biofeedback. °CLUSTER HEADACHES °Q: What is a cluster headache? What causes it? How can I treat it? °A: Cluster headache gets its name because the attacks come in groups. The pain arrives with little, if any, warning. It is usually on one side of the head. A tearing or bloodshot eye and a runny nose on the same side of the headache may also accompany the pain. Cluster headaches are believed to be caused by chemical reactions in the brain. They have been described as the most severe and intense of any headache type. Treatment for cluster headache includes prescription medication and oxygen. °SINUS HEADACHES °Q: What is a sinus headache? What causes it? How can I treat it? °A: When a cavity in the bones of the face and skull (a sinus) becomes inflamed, the inflammation will cause localized pain. This condition is usually the result of an allergic reaction, a tumor, or an infection. If your headache is caused by a sinus blockage, such as an infection, you will probably have a fever. An x-ray will confirm a sinus blockage. Your caregiver's treatment might include antibiotics for the infection, as well as antihistamines or decongestants.  °REBOUND HEADACHES °Q: What is a rebound headache? What causes it? How can I treat it? °A: A pattern of taking acute headache medications too often can lead to a condition known as "rebound headache."   A pattern of taking too much headache medication includes taking it more than 2 days per week or in excessive amounts. That means more than the label or a caregiver advises. With rebound  headaches, your medications not only stop relieving pain, they actually begin to cause headaches. Doctors treat rebound headache by tapering the medication that is being overused. Sometimes your caregiver will gradually substitute a different type of treatment or medication. Stopping may be a challenge. Regularly overusing a medication increases the potential for serious side effects. Consult a caregiver if you regularly use headache medications more than 2 days per week or more than the label advises. °ADDITIONAL QUESTIONS AND ANSWERS °Q: What is biofeedback? °A: Biofeedback is a self-help treatment. Biofeedback uses special equipment to monitor your body's involuntary physical responses. Biofeedback monitors: °· Breathing. °· Pulse. °· Heart rate. °· Temperature. °· Muscle tension. °· Brain activity. °Biofeedback helps you refine and perfect your relaxation exercises. You learn to control the physical responses that are related to stress. Once the technique has been mastered, you do not need the equipment any more. °Q: Are headaches hereditary? °A: Four out of five (80%) of people that suffer report a family history of migraine. Scientists are not sure if this is genetic or a family predisposition. Despite the uncertainty, a child has a 50% chance of having migraine if one parent suffers. The child has a 75% chance if both parents suffer.  °Q: Can children get headaches? °A: By the time they reach high school, most young people have experienced some type of headache. Many safe and effective approaches or medications can prevent a headache from occurring or stop it after it has begun.  °Q: What type of doctor should I see to diagnose and treat my headache? °A: Start with your primary caregiver. Discuss his or her experience and approach to headaches. Discuss methods of classification, diagnosis, and treatment. Your caregiver may decide to recommend you to a headache specialist, depending upon your symptoms or other  physical conditions. Having diabetes, allergies, etc., may require a more comprehensive and inclusive approach to your headache. The [employer] will provide, upon request, a list of NHF physician members in your state. °Document Released: 07/09/2003 Document Revised: 07/11/2011 Document Reviewed: 12/17/2007 °ExitCare® Patient Information ©2015 [employer]. This information is not intended to replace advice given to you by your health care provider. Make sure you discuss any questions you have with your health care provider. ° °Migraine Headache °A migraine headache is an intense, throbbing pain on one or both sides of your head. A migraine can last for 30 minutes to several hours. °CAUSES  °The exact cause of a migraine headache is not always known. However, a migraine may be caused when nerves in the brain become irritated and release chemicals that cause inflammation. This causes pain. °Certain things may also trigger migraines, such as: °· Alcohol. °· Smoking. °· Stress. °· Menstruation. °· Aged cheeses. °· Foods or drinks that contain nitrates, glutamate, aspartame, or tyramine. °· Lack of sleep. °· Chocolate. °· Caffeine. °· Hunger. °· Physical exertion. °· Fatigue. °· Medicines used to treat chest pain (nitroglycerine), birth control pills, estrogen, and some blood pressure medicines. °SIGNS AND SYMPTOMS °· Pain on one or both sides of your head. °· Pulsating or throbbing pain. °· Severe pain that prevents daily activities. °· Pain that is aggravated by any physical activity. °· Nausea, vomiting, or both. °· Dizziness. °· Pain with exposure to bright lights, loud noises, or activity. °·   General sensitivity to bright lights, loud noises, or smells. °Before you get a migraine, you may get warning signs that a migraine is coming (aura). An aura may include: °· Seeing flashing lights. °· Seeing bright spots, halos, or zigzag lines. °· Having tunnel vision or blurred vision. °· Having feelings  of numbness or tingling. °· Having trouble talking. °· Having muscle weakness. °DIAGNOSIS  °A migraine headache is often diagnosed based on: °· Symptoms. °· Physical exam. °· A CT scan or MRI of your head. These imaging tests cannot diagnose migraines, but they can help rule out other causes of headaches. °TREATMENT °Medicines may be given for pain and nausea. Medicines can also be given to help prevent recurrent migraines.  °HOME CARE INSTRUCTIONS °· Only take over-the-counter or prescription medicines for pain or discomfort as directed by your health care provider. The use of long-term narcotics is not recommended. °· Lie down in a dark, quiet room when you have a migraine. °· Keep a journal to find out what may trigger your migraine headaches. For example, write down: °¨ What you eat and drink. °¨ How much sleep you get. °¨ Any change to your diet or medicines. °· Limit alcohol consumption. °· Quit smoking if you smoke. °· Get 7-9 hours of sleep, or as recommended by your health care provider. °· Limit stress. °· Keep lights dim if bright lights bother you and make your migraines worse. °SEEK IMMEDIATE MEDICAL CARE IF:  °· Your migraine becomes severe. °· You have a fever. °· You have a stiff neck. °· You have vision loss. °· You have muscular weakness or loss of muscle control. °· You start losing your balance or have trouble walking. °· You feel faint or pass out. °· You have severe symptoms that are different from your first symptoms. °MAKE SURE YOU:  °· Understand these instructions. °· Will watch your condition. °· Will get help right away if you are not doing well or get worse. °Document Released: 04/18/2005 Document Revised: 09/02/2013 Document Reviewed: 12/24/2012 °ExitCare® Patient Information ©2015 [employer]. This information is not intended to replace advice given to you by your health care provider. Make sure you discuss any questions you have with your health care provider. ° °

## 2013-12-12 NOTE — ED Provider Notes (Signed)
CSN: [id number]     Arrival date & time 12/12/13  1605 History   First MD Initiated Contact with Patient 12/12/13 1631     Chief Complaint  Patient presents with  . Migraine   (Consider location/radiation/quality/duration/timing/severity/associated sxs/prior Treatment) HPI Comments: 33 year old female with a history of migraine headaches presents with a typical migraine headache for her that started yesterday and worsened today. It is accompanied by a mild photophobia and pain across the forehead encircling the mid skull like a band. Describes it as throbbing. Denies nausea, vomiting, problems vision, speech, hearing, swallowing, focal paresthesias or weakness. It is worse when she is supine better when sitting. She is taking OTC meds to include ibuprofen, aspirin and Aleve without improvement.   Past Medical History  Diagnosis Date  . Anxiety     PTSD  . Headache(784.0)     Migraines  . Arrhythmia   . HSV (herpes simplex virus) infection     no outbreak during current pregnancy  . Migraine    Past Surgical History  Procedure Laterality Date  . None    . No past surgeries     Family History  Problem Relation Age of Onset  . Post-traumatic stress disorder Father   . Alcohol abuse Father   . Drug abuse Father   . Diabetes Maternal Grandmother   . Cancer Paternal Grandmother     Great-Grandparent  . Cancer Paternal Grandfather     Great-Gradnparent   History  Substance Use Topics  . Smoking status: Never Smoker   . Smokeless tobacco: Never Used  . Alcohol Use: Yes   OB History   Grav Para Term Preterm Abortions TAB SAB Ect Mult Living   14 4 4  10 9 1   4      Review of Systems  Constitutional: Positive for activity change. Negative for fever, diaphoresis and fatigue.  Eyes: Positive for photophobia. Negative for pain, discharge and visual disturbance.  Respiratory: Negative.   Cardiovascular: Negative.   Gastrointestinal: Negative.   Genitourinary: Negative.    Musculoskeletal: Negative for back pain, gait problem, joint swelling, myalgias and neck pain.  Skin: Negative.   Neurological: Positive for headaches. Negative for dizziness, tremors, syncope, facial asymmetry, speech difficulty and light-headedness.  Psychiatric/Behavioral: Negative.     Allergies  Review of patient's allergies indicates no known allergies.  Home Medications   Prior to Admission medications   Medication Sig Start Date End Date Taking? Authorizing Provider  cephALEXin (KEFLEX) 500 MG capsule Take 1 capsule (500 mg total) by mouth 2 (two) times daily. 03/25/13   Gregor Hams, MD  GABAPENTIN PO Take by mouth.    Historical Provider, MD  metroNIDAZOLE (METROGEL VAGINAL) 0.75 % vaginal gel Place 1 Applicatorful vaginally at bedtime. X 5 days 04/09/13   Audelia Hives Presson, PA  phentermine 37.5 MG capsule Take 37.5 mg by mouth every morning.    Historical Provider, MD  Prenatal Vit-Fe Fumarate-FA (PRENATAL MULTIVITAMIN) TABS Take 1 tablet by mouth every morning.    Historical Provider, MD  sertraline (ZOLOFT) 50 MG tablet Take 1 tablet (50 mg total) by mouth daily. 12/08/11 12/07/12  Logan Bores, MD  UNKNOWN TO PATIENT BCP to control vaginal bleeding    Historical Provider, MD   BP 127/90  Pulse 90  Temp(Src) 98.3 F (36.8 C) (Oral)  Resp 16  SpO2 97% Physical Exam  Nursing note and vitals reviewed. Constitutional: She is oriented to person, place, and time. She appears well-developed and well-nourished.  No distress.  HENT:  Head: Normocephalic and atraumatic.  Right Ear: External ear normal.  Left Ear: External ear normal.  Mouth/Throat: No oropharyngeal exudate.  Eyes: EOM are normal. Pupils are equal, round, and reactive to light. Left eye exhibits no discharge.  Neck: Normal range of motion. Neck supple.  Cardiovascular: Normal rate, regular rhythm, normal heart sounds and intact distal pulses.   No murmur heard. Pulmonary/Chest: Effort normal and breath  sounds normal. No respiratory distress.  Musculoskeletal: Normal range of motion. She exhibits no edema and no tenderness.  Lymphadenopathy:    She has no cervical adenopathy.  Neurological: She is alert and oriented to person, place, and time. No cranial nerve deficit or sensory deficit. She exhibits normal muscle tone. Coordination and gait normal. GCS eye subscore is 4. GCS verbal subscore is 5. GCS motor subscore is 6.  Skin: Skin is warm and dry. No erythema.  Psychiatric: She has a normal mood and affect.    ED Course  Procedures (including critical care time) Labs Review Labs Reviewed - No data to display  Imaging Review No results found.   MDM   1. Migraine without status migrainosus, not intractable, unspecified migraine type     toradol 60 mg IM,  solumedrol 80 mg IM and Benadryl 50 mg IM Home and see your PCP re abortive medications. Do not drive next 6 hours. 1720: pt st not much improvement in headache. Will add Imitrex sq now 1815: Pt st headache no better, getting worse, upset that she is not receiving enough med despite the regimen above.  Allowed her to speak with medical director Dr. Juventino Slovak. She is now crying, visibly upset that she is not pain free.  Dr. Juventino Slovak recommended she take up to 150 mg of Trazadone when she gets home. She st she has that and takes it nightly but is not listed on her medication sheet.  If needing stronger meds such as narcotics recommend going to the ED.     Janne Napoleon, NP 12/12/13 [place], NP 12/12/13 [place], NP 12/12/13 [phone]

## 2013-12-12 NOTE — ED Provider Notes (Signed)
Medical screening examination/treatment/procedure(s) were performed by resident physician or non-physician practitioner and as supervising physician I was immediately available for consultation/collaboration.   Pauline Good MD.   Billy Fischer, MD 12/12/13 2015

## 2013-12-12 NOTE — ED Notes (Signed)
Still  Having    Pain        From        The  Migraine

## 2013-12-12 NOTE — ED Notes (Signed)
Dr. Juventino Slovak and Janne Napoleon NP talked to pt.  She was not happy when she left.

## 2013-12-12 NOTE — ED Notes (Signed)
Pt  States  Has had  Migraine  In past  Reports  A  Headache  Which  Started  Yesterday   Became  A              Migraine today   Photophobia       As  Well             Pt  denys  Any  Vomiting  Or  Nausea

## 2013-12-12 NOTE — ED Notes (Signed)
Pt. is upset that we are discharging her when her headache is no better. I told her she can go home and rest and f/u at ED if not better.  She can use Alleve or Iburprofen for pain.She said she tried those at home for 24 hrs without relief.  She said why should she wait at home and then come back at 1100 tonight because we have not given her anything to relieve her pain. I told her I would notify NP of her concerns.

## 2013-12-12 NOTE — Telephone Encounter (Signed)
LCSW went to registrar in order to make an appointment for the patient.  The appointment was made for Wed at 11am. When LCSW returned to the phone the Referral coordinator was not there.  LCSW called back and left a message with appointment time and date.

## 2013-12-18 ENCOUNTER — Ambulatory Visit: Payer: Medicaid Other | Attending: Internal Medicine | Admitting: *Deleted

## 2013-12-18 DIAGNOSIS — F341 Dysthymic disorder: Secondary | ICD-10-CM

## 2013-12-18 DIAGNOSIS — F419 Anxiety disorder, unspecified: Principal | ICD-10-CM

## 2013-12-18 DIAGNOSIS — F329 Major depressive disorder, single episode, unspecified: Secondary | ICD-10-CM
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
## 2013-12-27 ENCOUNTER — Ambulatory Visit: Payer: Medicaid Other | Attending: Internal Medicine | Admitting: *Deleted

## 2013-12-27 DIAGNOSIS — F419 Anxiety disorder, unspecified: Principal | ICD-10-CM

## 2013-12-27 DIAGNOSIS — F329 Major depressive disorder, single episode, unspecified: Secondary | ICD-10-CM

## 2013-12-27 DIAGNOSIS — F341 Dysthymic disorder: Secondary | ICD-10-CM

## 2013-12-27 NOTE — Progress Notes (Signed)
LCSW met with patient in order to continue processing challenges with managing her emotions.  LCSW used processes of cognitive behavioral processing in order to decrease anxiety and depressive symptoms.  Patient stated that she was feeling very anxious about her past experiences and was have a difficult managing her emotions over the past week.  LCSW processed with patient specific examples that were increasing anxiety and patient was able to identify that the core of the anxieties were not substantiated.  LCSW also processed with the patient the importance of timing.  LCSW also provided reframing in order to support a change in cognitive processing about parenting and the needs of the children.  Patient open to further processing and will work with this LCSW on concept of loss and abandonment through processing the grief cycle. Patient will return in 1 week.  Christene Lye MSW, LCSW Duration 75 minutes

## 2014-01-01 ENCOUNTER — Telehealth: Payer: Self-pay | Admitting: *Deleted

## 2014-01-03 ENCOUNTER — Other Ambulatory Visit: Payer: Medicaid Other

## 2014-01-07 ENCOUNTER — Ambulatory Visit: Payer: Medicaid Other | Attending: Internal Medicine | Admitting: *Deleted

## 2014-01-07 DIAGNOSIS — F419 Anxiety disorder, unspecified: Principal | ICD-10-CM

## 2014-01-07 DIAGNOSIS — F341 Dysthymic disorder: Secondary | ICD-10-CM

## 2014-01-07 DIAGNOSIS — F32A Depression, unspecified: Secondary | ICD-10-CM

## 2014-01-07 DIAGNOSIS — F329 Major depressive disorder, single episode, unspecified: Secondary | ICD-10-CM

## 2014-01-07 NOTE — Progress Notes (Signed)
LCSW met with patient in order to continue processing her challenges with mood and emotions.  LCSW reviewed with patient several successes that she has experienced.  LCSW also processed with patient the use of her strengths in order to address her challenges. LCSW also reviewed with patient depressive versus anxiety symptoms to encourage compliance with antidepressant medication.  Patient will schedule next session within 1 week.  Christene Lye MSW, LCSW Duration 60 minutes

## 2014-01-10 ENCOUNTER — Other Ambulatory Visit: Payer: Medicaid Other

## 2014-02-20 ENCOUNTER — Emergency Department (INDEPENDENT_AMBULATORY_CARE_PROVIDER_SITE_OTHER)
Admission: EM | Admit: 2014-02-20 | Discharge: 2014-02-22 | Disposition: A | Discharge status: Home / Self Care | Payer: Medicaid Other | Source: Home / Self Care | Attending: Emergency Medicine | Admitting: Emergency Medicine

## 2014-02-20 DIAGNOSIS — J069 Acute upper respiratory infection, unspecified: Secondary | ICD-10-CM

## 2014-02-21 ENCOUNTER — Encounter (HOSPITAL_COMMUNITY): Payer: Self-pay | Admitting: Emergency Medicine

## 2014-02-21 DIAGNOSIS — J069 Acute upper respiratory infection, unspecified: Secondary | ICD-10-CM

## 2014-02-21 LAB — POCT RAPID STREP A: Streptococcus, Group A Screen (Direct): NEGATIVE

## 2014-02-21 NOTE — ED Provider Notes (Addendum)
  Chief Complaint    Sore throat, cough and hoarseness.    History of Present Illness   Maria Ho is a 33 year old female who has had a three-day history of sore throat, hoarseness, cough productive of small amounts of brown sputum, chest tightness, right ear pain, and nasal congestion with clear rhinorrhea. She denies any fever or GI symptoms. She has had no known sick exposures.  Review of Systems   Other than as noted above, the patient denies any of the following symptoms: Systemic:  No fevers, chills, sweats, or myalgias. Eye:  No redness or discharge. ENT:  No ear pain, headache, nasal congestion, drainage, sinus pressure, or sore throat. Neck:  No neck pain, stiffness, or swollen glands. Lungs:  No cough, sputum production, hemoptysis, wheezing, chest tightness, shortness of breath or chest pain. GI:  No abdominal pain, nausea, vomiting or diarrhea.  [place]   Past medical history, family history, social history, meds, and allergies were reviewed.   Physical exam   Vital signs:  There were no vitals taken for this visit. General:  Alert and oriented.  In no distress.  Skin warm and dry. Eye:  No conjunctival injection or drainage. Lids were normal. ENT:  TMs and canals were normal, without erythema or inflammation.  Nasal mucosa was clear and uncongested, without drainage.  Mucous membranes were moist.  Pharynx was clear with no exudate or drainage.  There were no oral ulcerations or lesions. Neck:  Supple, no adenopathy, tenderness or mass. Lungs:  No respiratory distress.  Lungs were clear to auscultation, without wheezes, rales or rhonchi.  Breath sounds were clear and equal bilaterally.  Heart:  Regular rhythm, without gallops, murmers or rubs. Skin:  Clear, warm, and dry, without rash or lesions.  Labs   Results for orders placed during the hospital encounter of 02/20/14  POCT RAPID STREP A ([facility] CARE ONLY)      Result Value Ref Range   Streptococcus, Group  A Screen (Direct) NEGATIVE  NEGATIVE    Assessment     The encounter diagnosis was Viral URI.  No indication for antibiotics. No evidence of strep throat, sinusitis, otitis media, or pneumonia.  Plan    1.  Meds:  The following meds were prescribed:   New Prescriptions   No medications on file    2.  Patient Education/Counseling:  The patient was given appropriate handouts, self care instructions, and instructed in symptomatic relief.  Instructed to get extra fluids and extra rest.  Suggested over-the-counter medications such as ibuprofen, Delsym syrup, or Zyrtec-D.  3.  Follow up:  The patient was told to follow up here if no better in 3 to 4 days, or sooner if becoming worse in any way, and given some red flag symptoms such as increasing fever, difficulty breathing, chest pain, or persistent vomiting which would prompt immediate return.       Harden Mo, MD 02/21/14 Maria Ho  Harden Mo, MD 02/23/14 [phone]

## 2014-02-21 NOTE — Discharge Instructions (Signed)
Upper Respiratory Infection, Adult An upper respiratory infection (URI) is also sometimes known as the common cold. The upper respiratory tract includes the nose, sinuses, throat, trachea, and bronchi. Bronchi are the airways leading to the lungs. Most people improve within 1 week, but symptoms can last up to 2 weeks. A residual cough may last even longer.  CAUSES Many different viruses can infect the tissues lining the upper respiratory tract. The tissues become irritated and inflamed and often become very moist. Mucus production is also common. A cold is contagious. You can easily spread the virus to others by oral contact. This includes kissing, sharing a glass, coughing, or sneezing. Touching your mouth or nose and then touching a surface, which is then touched by another person, can also spread the virus. SYMPTOMS  Symptoms typically develop 1 to 3 days after you come in contact with a cold virus. Symptoms vary from person to person. They may include:  Runny nose.  Sneezing.  Nasal congestion.  Sinus irritation.  Sore throat.  Loss of voice (laryngitis).  Cough.  Fatigue.  Muscle aches.  Loss of appetite.  Headache.  Low-grade fever. DIAGNOSIS  You might diagnose your own cold based on familiar symptoms, since most people get a cold 2 to 3 times a year. Your caregiver can confirm this based on your exam. Most importantly, your caregiver can check that your symptoms are not due to another disease such as strep throat, sinusitis, pneumonia, asthma, or epiglottitis. Blood tests, throat tests, and X-rays are not necessary to diagnose a common cold, but they may sometimes be helpful in excluding other more serious diseases. Your caregiver will decide if any further tests are required. RISKS AND COMPLICATIONS  You may be at risk for a more severe case of the common cold if you smoke cigarettes, have chronic heart disease (such as heart failure) or lung disease (such as asthma), or if  you have a weakened immune system. The very young and very old are also at risk for more serious infections. Bacterial sinusitis, middle ear infections, and bacterial pneumonia can complicate the common cold. The common cold can worsen asthma and chronic obstructive pulmonary disease (COPD). Sometimes, these complications can require emergency medical care and may be life-threatening. PREVENTION  The best way to protect against getting a cold is to practice good hygiene. Avoid oral or hand contact with people with cold symptoms. Wash your hands often if contact occurs. There is no clear evidence that vitamin C, vitamin E, echinacea, or exercise reduces the chance of developing a cold. However, it is always recommended to get plenty of rest and practice good nutrition. TREATMENT  Treatment is directed at relieving symptoms. There is no cure. Antibiotics are not effective, because the infection is caused by a virus, not by bacteria. Treatment may include:  Increased fluid intake. Sports drinks offer valuable electrolytes, sugars, and fluids.  Breathing heated mist or steam (vaporizer or shower).  Eating chicken soup or other clear broths, and maintaining good nutrition.  Getting plenty of rest.  Using gargles or lozenges for comfort.  Controlling fevers with ibuprofen or acetaminophen as directed by your caregiver.  Increasing usage of your inhaler if you have asthma. Zinc gel and zinc lozenges, taken in the first 24 hours of the common cold, can shorten the duration and lessen the severity of symptoms. Pain medicines may help with fever, muscle aches, and throat pain. A variety of non-prescription medicines are available to treat congestion and runny nose. Your caregiver   can make recommendations and may suggest nasal or lung inhalers for other symptoms.  HOME CARE INSTRUCTIONS   Only take over-the-counter or prescription medicines for pain, discomfort, or fever as directed by your  caregiver.  Use a warm mist humidifier or inhale steam from a shower to increase air moisture. This may keep secretions moist and make it easier to breathe.  Drink enough water and fluids to keep your urine clear or pale yellow.  Rest as needed.  Return to work when your temperature has returned to normal or as your caregiver advises. You may need to stay home longer to avoid infecting others. You can also use a face mask and careful hand washing to prevent spread of the virus. SEEK MEDICAL CARE IF:   After the first few days, you feel you are getting worse rather than better.  You need your caregiver's advice about medicines to control symptoms.  You develop chills, worsening shortness of breath, or brown or red sputum. These may be signs of pneumonia.  You develop yellow or brown nasal discharge or pain in the face, especially when you bend forward. These may be signs of sinusitis.  You develop a fever, swollen neck glands, pain with swallowing, or white areas in the back of your throat. These may be signs of strep throat. SEEK IMMEDIATE MEDICAL CARE IF:   You have a fever.  You develop severe or persistent headache, ear pain, sinus pain, or chest pain.  You develop wheezing, a prolonged cough, cough up blood, or have a change in your usual mucus (if you have chronic lung disease).  You develop sore muscles or a stiff neck. Document Released: 10/12/2000 Document Revised: 07/11/2011 Document Reviewed: 07/24/2013 [employer] Patient Information 2015 [employer]. This information is not intended to replace advice given to you by your health care provider. Make sure you discuss any questions you have with your health care provider.  

## 2014-02-23 LAB — CULTURE, GROUP A STREP

## 2014-02-27 ENCOUNTER — Emergency Department (INDEPENDENT_AMBULATORY_CARE_PROVIDER_SITE_OTHER)
Admission: EM | Admit: 2014-02-27 | Discharge: 2014-02-27 | Disposition: A | Discharge status: Home / Self Care | Payer: Medicaid Other | Source: Home / Self Care | Attending: Family Medicine | Admitting: Family Medicine

## 2014-02-27 ENCOUNTER — Encounter (HOSPITAL_COMMUNITY): Payer: Self-pay | Admitting: Emergency Medicine

## 2014-02-27 DIAGNOSIS — M545 Low back pain, unspecified: Secondary | ICD-10-CM

## 2014-02-27 MED ORDER — CYCLOBENZAPRINE HCL 5 MG PO TABS: 5.0000 mg | ORAL_TABLET | Freq: Three times a day (TID) | ORAL | Status: DC | PRN

## 2014-02-27 NOTE — Discharge Instructions (Signed)
Back Pain, Adult Low back pain is very common. About 1 in 5 people have back pain.The cause of low back pain is rarely dangerous. The pain often gets better over time.About half of people with a sudden onset of back pain feel better in just 2 weeks. About 8 in 10 people feel better by 6 weeks.  CAUSES Some common causes of back pain include:  Strain of the muscles or ligaments supporting the spine.  Wear and tear (degeneration) of the spinal discs.  Arthritis.  Direct injury to the back. DIAGNOSIS Most of the time, the direct cause of low back pain is not known.However, back pain can be treated effectively even when the exact cause of the pain is unknown.Answering your caregiver's questions about your overall health and symptoms is one of the most accurate ways to make sure the cause of your pain is not dangerous. If your caregiver needs more information, he or she may order lab work or imaging tests (X-rays or MRIs).However, even if imaging tests show changes in your back, this usually does not require surgery. HOME CARE INSTRUCTIONS For many people, back pain returns.Since low back pain is rarely dangerous, it is often a condition that people can learn to manageon their own.   Remain active. It is stressful on the back to sit or stand in one place. Do not sit, drive, or stand in one place for more than 30 minutes at a time. Take short walks on level surfaces as soon as pain allows.Try to increase the length of time you walk each day.  Do not stay in bed.Resting more than 1 or 2 days can delay your recovery.  Do not avoid exercise or work.Your body is made to move.It is not dangerous to be active, even though your back may hurt.Your back will likely heal faster if you return to being active before your pain is gone.  Pay attention to your body when you bend and lift. Many people have less discomfortwhen lifting if they bend their knees, keep the load close to their bodies,and  avoid twisting. Often, the most comfortable positions are those that put less stress on your recovering back.  Find a comfortable position to sleep. Use a firm mattress and lie on your side with your knees slightly bent. If you lie on your back, put a pillow under your knees.  Only take over-the-counter or prescription medicines as directed by your caregiver. Over-the-counter medicines to reduce pain and inflammation are often the most helpful.Your caregiver may prescribe muscle relaxant drugs.These medicines help dull your pain so you can more quickly return to your normal activities and healthy exercise.  Put ice on the injured area.  Put ice in a plastic bag.  Place a towel between your skin and the bag.  Leave the ice on for 15-20 minutes, 03-04 times a day for the first 2 to 3 days. After that, ice and heat may be alternated to reduce pain and spasms.  Ask your caregiver about trying back exercises and gentle massage. This may be of some benefit.  Avoid feeling anxious or stressed.Stress increases muscle tension and can worsen back pain.It is important to recognize when you are anxious or stressed and learn ways to manage it.Exercise is a great option. SEEK MEDICAL CARE IF:  You have pain that is not relieved with rest or medicine.  You have pain that does not improve in 1 week.  You have new symptoms.  You are generally not feeling well. SEEK   IMMEDIATE MEDICAL CARE IF:   You have pain that radiates from your back into your legs.  You develop new bowel or bladder control problems.  You have unusual weakness or numbness in your arms or legs.  You develop nausea or vomiting.  You develop abdominal pain.  You feel faint. Document Released: 04/18/2005 Document Revised: 10/18/2011 Document Reviewed: 08/20/2013 Exit[employer] Patient Information 2015 [employer]. This information is not intended to replace advice given to you by your health care provider. Make sure you  discuss any questions you have with your health care provider.  

## 2014-02-27 NOTE — ED Provider Notes (Signed)
CSN: [id number]     Arrival date & time 02/27/14  1018 History   First MD Initiated Contact with Patient 02/27/14 1113     Chief Complaint  Patient presents with  . Back Pain   (Consider location/radiation/quality/duration/timing/severity/associated sxs/prior Treatment) HPI    33 year old female presents complaining of having thrown out her back the day before. She was standing up from a chair when she felt her back spasm. She denies any heavy lifting, she says she doesn't know how this has happened but it has done this in the past. She is taking ibuprofen with only minimal relief. She says that usually when this happens she gets a prescription for Flexeril which is very helpful. She denies any loss of bowel or bladder control, no extremity numbness or weakness. No history of IV drug use. This is exactly the same as previous episodes of back spasms  Past Medical History  Diagnosis Date  . Anxiety     PTSD  . Headache(784.0)     Migraines  . Arrhythmia   . HSV (herpes simplex virus) infection     no outbreak during current pregnancy  . Migraine    Past Surgical History  Procedure Laterality Date  . None    . No past surgeries     Family History  Problem Relation Age of Onset  . Post-traumatic stress disorder Father   . Alcohol abuse Father   . Drug abuse Father   . Diabetes Maternal Grandmother   . Cancer Paternal Grandmother     Great-Grandparent  . Cancer Paternal Grandfather     Great-Gradnparent   History  Substance Use Topics  . Smoking status: Never Smoker   . Smokeless tobacco: Never Used  . Alcohol Use: Yes   OB History   Grav Para Term Preterm Abortions TAB SAB Ect Mult Living   14 4 4  10 9 1   4      Review of Systems  Constitutional: Negative for fever and chills.  Musculoskeletal: Positive for back pain.  Neurological: Negative for weakness and numbness.  All other systems reviewed and are negative.   Allergies  Review of patient's allergies  indicates no known allergies.  Home Medications   Prior to Admission medications   Medication Sig Start Date End Date Taking? Authorizing Provider  cephALEXin (KEFLEX) 500 MG capsule Take 1 capsule (500 mg total) by mouth 2 (two) times daily. 03/25/13   Gregor Hams, MD  cyclobenzaprine (FLEXERIL) 5 MG tablet Take 1-2 tablets (5-10 mg total) by mouth 3 (three) times daily as needed for muscle spasms. 02/27/14   Freeman Caldron Keairra Bardon, PA-C  GABAPENTIN PO Take by mouth.    Historical Provider, MD  metroNIDAZOLE (METROGEL VAGINAL) 0.75 % vaginal gel Place 1 Applicatorful vaginally at bedtime. X 5 days 04/09/13   Audelia Hives Presson, PA  phentermine 37.5 MG capsule Take 37.5 mg by mouth every morning.    Historical Provider, MD  Prenatal Vit-Fe Fumarate-FA (PRENATAL MULTIVITAMIN) TABS Take 1 tablet by mouth every morning.    Historical Provider, MD  sertraline (ZOLOFT) 50 MG tablet Take 1 tablet (50 mg total) by mouth daily. 12/08/11 12/07/12  Logan Bores, MD  UNKNOWN TO PATIENT BCP to control vaginal bleeding    Historical Provider, MD   BP 120/86  Pulse 83  Temp(Src) 98.4 F (36.9 C) (Oral)  Resp 14  SpO2 98%  LMP 02/21/2014 Physical Exam  Nursing note and vitals reviewed. Constitutional: She is oriented to person,  place, and time. Vital signs are normal. She appears well-developed and well-nourished. No distress.  HENT:  Head: Normocephalic and atraumatic.  Pulmonary/Chest: Effort normal. No respiratory distress.  Musculoskeletal:       Lumbar back: She exhibits decreased range of motion (decreased flexion) and pain. She exhibits no tenderness, no bony tenderness, no swelling, no edema, no deformity and no spasm.  Neurological: She is alert and oriented to person, place, and time. She has normal strength and normal reflexes. No cranial nerve deficit or sensory deficit. She exhibits normal muscle tone. She displays a negative Romberg sign. Gait (slow, antalgic) abnormal. Coordination  normal.  Skin: Skin is warm and dry. No rash noted. She is not diaphoretic.  Psychiatric: She has a normal mood and affect. Judgment normal.    ED Course  Procedures (including critical care time) Labs Review Labs Reviewed - No data to display  Imaging Review No results found.   MDM   1. Bilateral low back pain without sciatica    She has ibuprofen, I will give her a prescription for Flexeril. Follow-up as needed. Return precautions reviewed with the patient   Meds ordered this encounter  Medications  . cyclobenzaprine (FLEXERIL) 5 MG tablet    Sig: Take 1-2 tablets (5-10 mg total) by mouth 3 (three) times daily as needed for muscle spasms.    Dispense:  60 tablet    Refill:  0    Order Specific Question:  Supervising Provider    Answer:  Lynne Leader, [place]       Liam Graham, PA-C 02/27/14 1408

## 2014-03-03 ENCOUNTER — Encounter (HOSPITAL_COMMUNITY): Payer: Self-pay | Admitting: Emergency Medicine

## 2014-10-12 ENCOUNTER — Emergency Department (HOSPITAL_COMMUNITY)
Admission: EM | Admit: 2014-10-12 | Discharge: 2014-10-12 | Disposition: A | Discharge status: Home / Self Care | Payer: Medicaid Other | Attending: Emergency Medicine | Admitting: Emergency Medicine

## 2014-10-12 ENCOUNTER — Encounter (HOSPITAL_COMMUNITY): Payer: Self-pay | Admitting: Emergency Medicine

## 2014-10-12 DIAGNOSIS — Z792 Long term (current) use of antibiotics: Secondary | ICD-10-CM | POA: Insufficient documentation

## 2014-10-12 DIAGNOSIS — Z8679 Personal history of other diseases of the circulatory system: Secondary | ICD-10-CM | POA: Insufficient documentation

## 2014-10-12 DIAGNOSIS — Z8619 Personal history of other infectious and parasitic diseases: Secondary | ICD-10-CM | POA: Diagnosis not present

## 2014-10-12 DIAGNOSIS — F419 Anxiety disorder, unspecified: Secondary | ICD-10-CM | POA: Diagnosis not present

## 2014-10-12 DIAGNOSIS — M79602 Pain in left arm: Secondary | ICD-10-CM | POA: Diagnosis present

## 2014-10-12 DIAGNOSIS — G5602 Carpal tunnel syndrome, left upper limb: Secondary | ICD-10-CM | POA: Insufficient documentation

## 2014-10-12 DIAGNOSIS — G43909 Migraine, unspecified, not intractable, without status migrainosus: Secondary | ICD-10-CM | POA: Diagnosis not present

## 2014-10-12 DIAGNOSIS — G629 Polyneuropathy, unspecified: Secondary | ICD-10-CM

## 2014-10-12 MED ORDER — IBUPROFEN 600 MG PO TABS: 600.0000 mg | ORAL_TABLET | Freq: Three times a day (TID) | ORAL | Status: DC

## 2014-10-12 MED ORDER — IBUPROFEN 200 MG PO TABS: 600.0000 mg | ORAL_TABLET | Freq: Once | ORAL | Status: DC

## 2014-10-12 NOTE — ED Notes (Signed)
Pt c/o pain and numbness to L arm onset 0100. Denies CP, denies [facility]

## 2014-10-12 NOTE — Discharge Instructions (Signed)
Carpal Tunnel Syndrome The carpal tunnel is an area under the skin of the palm of your hand. Nerves, blood vessels, and strong tissues (tendons) pass through the tunnel. The tunnel can become puffy (swollen). If this happens, a nerve can be pinched in the wrist. This causes carpal tunnel syndrome.  HOME CARE  Take all medicine as told by your doctor.  If you were given a splint, wear it as told. Wear it at night or at times when your doctor told you to.  Rest your wrist from the activity that causes your pain.  Put ice on your wrist after long periods of wrist activity.  Put ice in a plastic bag.  Place a towel between your skin and the bag.  Leave the ice on for 15-20 minutes, 03-04 times a day.  Keep all doctor visits as told. GET HELP RIGHT AWAY IF:  You have new problems you cannot explain.  Your problems get worse and medicine does not help. MAKE SURE YOU:   Understand these instructions.  Will watch your condition.  Will get help right away if you are not doing well or get worse. Document Released: 04/07/2011 Document Revised: 07/11/2011 Document Reviewed: 04/07/2011 [facility] Patient Information 2015 [place], [place]. This information is not intended to replace advice given to you by your health care provider. Make sure you discuss any questions you have with your health care provider.

## 2014-10-12 NOTE — ED Notes (Signed)
MD at bedside. 

## 2014-10-12 NOTE — ED Provider Notes (Signed)
CSN: [id number]     Arrival date & time 10/12/14  0086 History   First MD Initiated Contact with Patient 10/12/14 0322     Chief Complaint  Patient presents with  . arm pain      (Consider location/radiation/quality/duration/timing/severity/associated sxs/prior Treatment) HPI Patient presents with left hand paresthesias that radiate up the left arm starting this evening around 1 AM. States she's been typing a lot today. No weakness. No known trauma. No chest pain or shortness of breath. Past Medical History  Diagnosis Date  . Anxiety     PTSD  . Headache(784.0)     Migraines  . Arrhythmia   . HSV (herpes simplex virus) infection     no outbreak during current pregnancy  . Migraine    Past Surgical History  Procedure Laterality Date  . None    . No past surgeries     Family History  Problem Relation Age of Onset  . Post-traumatic stress disorder Father   . Alcohol abuse Father   . Drug abuse Father   . Diabetes Maternal Grandmother   . Cancer Paternal Grandmother     Great-Grandparent  . Cancer Paternal Grandfather     Great-Gradnparent   History  Substance Use Topics  . Smoking status: Never Smoker   . Smokeless tobacco: Never Used  . Alcohol Use: Yes   OB History    Gravida Para Term Preterm AB TAB SAB Ectopic Multiple Living   14 4 4  10 9 1   4      Review of Systems  Constitutional: Negative for fever and chills.  Respiratory: Negative for shortness of breath.   Cardiovascular: Negative for chest pain.  Musculoskeletal: Negative for arthralgias, neck pain and neck stiffness.  Skin: Negative for rash and wound.  Neurological: Positive for numbness. Negative for weakness.  All other systems reviewed and are negative.     Allergies  Review of patient's allergies indicates no known allergies.  Home Medications   Prior to Admission medications   Medication Sig Start Date End Date Taking? Authorizing Provider  cephALEXin (KEFLEX) 500 MG capsule Take 1  capsule (500 mg total) by mouth 2 (two) times daily. 03/25/13   Gregor Hams, MD  cyclobenzaprine (FLEXERIL) 5 MG tablet Take 1-2 tablets (5-10 mg total) by mouth 3 (three) times daily as needed for muscle spasms. 02/27/14   Freeman Caldron Baker, PA-C  GABAPENTIN PO Take by mouth.    Historical Provider, MD  ibuprofen (ADVIL,MOTRIN) 600 MG tablet Take 1 tablet (600 mg total) by mouth 3 (three) times daily after meals. 10/12/14   Julianne Rice, MD  metroNIDAZOLE (METROGEL VAGINAL) 0.75 % vaginal gel Place 1 Applicatorful vaginally at bedtime. X 5 days 04/09/13   Audelia Hives Presson, PA  phentermine 37.5 MG capsule Take 37.5 mg by mouth every morning.    Historical Provider, MD  Prenatal Vit-Fe Fumarate-FA (PRENATAL MULTIVITAMIN) TABS Take 1 tablet by mouth every morning.    Historical Provider, MD  sertraline (ZOLOFT) 50 MG tablet Take 1 tablet (50 mg total) by mouth daily. 12/08/11 12/07/12  Paula Compton, MD  UNKNOWN TO PATIENT BCP to control vaginal bleeding    Historical Provider, MD   BP 146/88 mmHg  Pulse 76  Temp(Src) 97.8 F (36.6 C) (Oral)  Resp 16  Ht 5\' 9"  (1.753 m)  Wt 214 lb (97.07 kg)  BMI 31.59 kg/m2  SpO2 100%  LMP 10/06/2014 Physical Exam  Constitutional: She is oriented to person, place, and  time. She appears well-developed and well-nourished. No distress.  HENT:  Head: Normocephalic and atraumatic.  Mouth/Throat: Oropharynx is clear and moist.  Eyes: EOM are normal. Pupils are equal, round, and reactive to light.  Neck: Normal range of motion. Neck supple.  Cardiovascular: Normal rate and regular rhythm.   Pulmonary/Chest: Effort normal and breath sounds normal. No respiratory distress. She has no wheezes. She has no rales.  Abdominal: Soft. Bowel sounds are normal.  Musculoskeletal: Normal range of motion. She exhibits no edema or tenderness.  Positive Tinel sign to the left wrist. Good distal cap refill. No swelling noted to the left wrist and elbow or shoulder. Full  range of motion of each.  Neurological: She is alert and oriented to person, place, and time.  Subjective paresthesias to the surface of the left hand. Equal bilateral grip strength. 5/5 motor in all extremities. Sensation otherwise intact.  Skin: Skin is warm and dry. No rash noted. No erythema.  Psychiatric: She has a normal mood and affect. Her behavior is normal.  Nursing note and vitals reviewed.   ED Course  Procedures (including critical care time) Labs Review Labs Reviewed - No data to display  Imaging Review No results found.   EKG Interpretation None      MDM   Final diagnoses:  Peripheral neuropathy  Carpal tunnel syndrome of left wrist    Patient with paresthesias to the left hand. Suspect couple tunnel syndrome. We'll place in a wrist splint and start on ibuprofen. Patient's been instructed to follow-up with a hand surgeon should symptoms continue. I have no suspicion for cardiac cause to the patient's symptoms. They're easily reproduced with percussion over the nerve. She has no chest pain. Just have risk factors. I don't believe workup is necessary for cardiac origin.    Julianne Rice, MD 10/12/14 0600

## 2014-10-27 ENCOUNTER — Encounter (HOSPITAL_COMMUNITY): Payer: Self-pay | Admitting: Emergency Medicine

## 2014-10-27 ENCOUNTER — Emergency Department (HOSPITAL_COMMUNITY)
Admission: EM | Admit: 2014-10-27 | Discharge: 2014-10-27 | Disposition: A | Discharge status: Home / Self Care | Payer: Medicaid Other | Attending: Emergency Medicine | Admitting: Emergency Medicine

## 2014-10-27 ENCOUNTER — Emergency Department (HOSPITAL_COMMUNITY): Payer: Medicaid Other

## 2014-10-27 DIAGNOSIS — R200 Anesthesia of skin: Secondary | ICD-10-CM | POA: Diagnosis not present

## 2014-10-27 DIAGNOSIS — R0789 Other chest pain: Secondary | ICD-10-CM | POA: Diagnosis not present

## 2014-10-27 DIAGNOSIS — R079 Chest pain, unspecified: Secondary | ICD-10-CM | POA: Diagnosis present

## 2014-10-27 DIAGNOSIS — G43909 Migraine, unspecified, not intractable, without status migrainosus: Secondary | ICD-10-CM | POA: Diagnosis not present

## 2014-10-27 DIAGNOSIS — F419 Anxiety disorder, unspecified: Secondary | ICD-10-CM | POA: Insufficient documentation

## 2014-10-27 DIAGNOSIS — F4310 Post-traumatic stress disorder, unspecified: Secondary | ICD-10-CM | POA: Diagnosis not present

## 2014-10-27 DIAGNOSIS — Z8619 Personal history of other infectious and parasitic diseases: Secondary | ICD-10-CM | POA: Insufficient documentation

## 2014-10-27 DIAGNOSIS — Z79899 Other long term (current) drug therapy: Secondary | ICD-10-CM | POA: Insufficient documentation

## 2014-10-27 LAB — CBC WITH DIFFERENTIAL/PLATELET
BASOS ABS: 0.1 10*3/uL (ref 0.0–0.1)
Basophils Relative: 1 % (ref 0–1)
Eosinophils Absolute: 0.1 10*3/uL (ref 0.0–0.7)
Eosinophils Relative: 2 % (ref 0–5)
HEMATOCRIT: 44.8 % (ref 36.0–46.0)
Hemoglobin: 14.6 g/dL (ref 12.0–15.0)
LYMPHS PCT: 30 % (ref 12–46)
Lymphs Abs: 2.6 10*3/uL (ref 0.7–4.0)
MCH: 26.3 pg (ref 26.0–34.0)
MCHC: 32.6 g/dL (ref 30.0–36.0)
MCV: 80.7 fL (ref 78.0–100.0)
Monocytes Absolute: 0.5 10*3/uL (ref 0.1–1.0)
Monocytes Relative: 6 % (ref 3–12)
Neutro Abs: 5.2 10*3/uL (ref 1.7–7.7)
Neutrophils Relative %: 61 % (ref 43–77)
Platelets: 234 10*3/uL (ref 150–400)
RBC: 5.55 MIL/uL — ABNORMAL HIGH (ref 3.87–5.11)
RDW: 13.8 % (ref 11.5–15.5)
WBC: 8.5 10*3/uL (ref 4.0–10.5)

## 2014-10-27 LAB — COMPREHENSIVE METABOLIC PANEL
ALK PHOS: 68 U/L (ref 38–126)
ALT: 17 U/L (ref 14–54)
AST: 18 U/L (ref 15–41)
Albumin: 3.8 g/dL (ref 3.5–5.0)
Anion gap: 7 (ref 5–15)
BUN: 10 mg/dL (ref 6–20)
CALCIUM: 8.6 mg/dL — AB (ref 8.9–10.3)
CHLORIDE: 108 mmol/L (ref 101–111)
CO2: 22 mmol/L (ref 22–32)
CREATININE: 0.67 mg/dL (ref 0.44–1.00)
GFR calc Af Amer: >60 mL/min (ref >60)
GFR calc non Af Amer: >60 mL/min (ref >60)
Glucose, Bld: 96 mg/dL (ref 65–99)
Potassium: 3.9 mmol/L (ref 3.5–5.1)
Sodium: 137 mmol/L (ref 135–145)
Total Bilirubin: 0.4 mg/dL (ref 0.3–1.2)
Total Protein: 7.5 g/dL (ref 6.5–8.1)

## 2014-10-27 LAB — TROPONIN I

## 2014-10-27 LAB — CK: Total CK: 125 U/L (ref 38–234)

## 2014-10-27 LAB — D-DIMER, QUANTITATIVE (NOT AT ARMC): D DIMER QUANT: 0.35 ug{FEU}/mL (ref 0.00–0.48)

## 2014-10-27 MED ORDER — LORAZEPAM 1 MG PO TABS
1.0000 mg | ORAL_TABLET | Freq: Once | ORAL | Status: AC
  Administered 2014-10-27: 1 mg via ORAL
  Filled 2014-10-27: qty 1

## 2014-10-27 MED ORDER — IOHEXOL 350 MG/ML SOLN
100.0000 mL | Freq: Once | INTRAVENOUS | Status: AC | PRN
  Administered 2014-10-27: 100 mL via INTRAVENOUS

## 2014-10-27 MED ORDER — KETOROLAC TROMETHAMINE 30 MG/ML IJ SOLN
30.0000 mg | Freq: Once | INTRAMUSCULAR | Status: AC
  Administered 2014-10-27: 30 mg via INTRAVENOUS
  Filled 2014-10-27: qty 1

## 2014-10-27 NOTE — ED Provider Notes (Signed)
CSN: [id number]     Arrival date & time 10/27/14  1612 History   First MD Initiated Contact with Patient 10/27/14 1616     Chief Complaint  Patient presents with  . Chest Pain     (Consider location/radiation/quality/duration/timing/severity/associated sxs/prior Treatment) HPI Comments:  Patient presents with central chest pain that woke her from sleep around 4:30 this morning. The pain is constant. It is worse with palpation and movement of her left arm. EMS gave her aspirin and nitroglycerin with partial relief. She reports tingling and numbness in bilateral hands. She was seen 2 weeks ago for this and told that it was probably related to carpal tunnel. She denies any weakness or trauma. She denies any cardiac history. The pain does not radiate to her neck or back. It is somewhat worse with deep breathing. She does have Mirena birth control. She denies any leg pain or leg swelling. No cardiac history.  The history is provided by the patient.    Past Medical History  Diagnosis Date  . Anxiety     PTSD  . Headache(784.0)     Migraines  . Arrhythmia   . HSV (herpes simplex virus) infection     no outbreak during current pregnancy  . Migraine    Past Surgical History  Procedure Laterality Date  . None    . No past surgeries     Family History  Problem Relation Age of Onset  . Post-traumatic stress disorder Father   . Alcohol abuse Father   . Drug abuse Father   . Diabetes Maternal Grandmother   . Cancer Paternal Grandmother     Great-Grandparent  . Cancer Paternal Grandfather     Great-Gradnparent   History  Substance Use Topics  . Smoking status: Never Smoker   . Smokeless tobacco: Never Used  . Alcohol Use: Yes   OB History    Gravida Para Term Preterm AB TAB SAB Ectopic Multiple Living   14 4 4  10 9 1   4      Review of Systems  Constitutional: Negative for fever, activity change and appetite change.  HENT: Negative for congestion and rhinorrhea.    Respiratory: Negative for cough, chest tightness and shortness of breath.   Cardiovascular: Positive for chest pain.  Gastrointestinal: Negative for nausea, vomiting and abdominal pain.  Genitourinary: Negative for dysuria, hematuria, vaginal bleeding and vaginal discharge.  Musculoskeletal: Negative for myalgias and arthralgias.  Neurological: Positive for numbness. Negative for dizziness, weakness, light-headedness and headaches.   A complete 10 system review of systems was obtained and all systems are negative except as noted in the HPI and PMH.     Allergies  Review of patient's allergies indicates no known allergies.  Home Medications   Prior to Admission medications   Medication Sig Start Date End Date Taking? Authorizing Provider  ibuprofen (ADVIL,MOTRIN) 800 MG tablet Take 800 mg by mouth every 8 (eight) hours as needed for moderate pain.   Yes Historical Provider, MD  cephALEXin (KEFLEX) 500 MG capsule Take 1 capsule (500 mg total) by mouth 2 (two) times daily. Patient not taking: Reported on 10/27/2014 03/25/13   Gregor Hams, MD  cyclobenzaprine (FLEXERIL) 5 MG tablet Take 1-2 tablets (5-10 mg total) by mouth 3 (three) times daily as needed for muscle spasms. Patient not taking: Reported on 10/27/2014 02/27/14   Liam Graham, PA-C  ibuprofen (ADVIL,MOTRIN) 600 MG tablet Take 1 tablet (600 mg total) by mouth 3 (three) times daily after meals.  Patient not taking: Reported on 10/27/2014 10/12/14   Julianne Rice, MD  metroNIDAZOLE (METROGEL VAGINAL) 0.75 % vaginal gel Place 1 Applicatorful vaginally at bedtime. X 5 days Patient not taking: Reported on 10/27/2014 04/09/13   Audelia Hives Presson, PA  sertraline (ZOLOFT) 50 MG tablet Take 1 tablet (50 mg total) by mouth daily. 12/08/11 12/07/12  Paula Compton, MD   BP 132/90 mmHg  Pulse 91  Temp(Src) 98.6 F (37 C) (Oral)  Resp 19  Ht 5\' 9"  (1.753 m)  Wt 220 lb (99.791 kg)  BMI 32.47 kg/m2  SpO2 99%  LMP  10/06/2014 Physical Exam  Constitutional: She is oriented to person, place, and time. She appears well-developed and well-nourished. No distress.  HENT:  Head: Normocephalic and atraumatic.  Mouth/Throat: Oropharynx is clear and moist. No oropharyngeal exudate.  Eyes: Conjunctivae and EOM are normal. Pupils are equal, round, and reactive to light.  Neck: Normal range of motion. Neck supple.  No meningismus.  Cardiovascular: Normal rate, regular rhythm and normal heart sounds.   No murmur heard.  Both feet are equally cool. Palpable DP pulse on right leg. Unable to Doppler pulse of left leg. Equal radial pulses bilaterally  Pulmonary/Chest: Effort normal and breath sounds normal. No respiratory distress. She exhibits tenderness.  L chest wall reproducible tenderness  Abdominal: Soft. There is no tenderness. There is no rebound and no guarding.  Musculoskeletal: Normal range of motion. She exhibits no edema or tenderness.  Worse with L arm movement  Neurological: She is alert and oriented to person, place, and time. No cranial nerve deficit. She exhibits normal muscle tone. Coordination normal.  No ataxia on finger to nose bilaterally. No pronator drift. 5/5 strength throughout. CN 2-12 intact. Negative Romberg. Equal grip strength. Sensation intact. Gait is normal.  Difficult to obtain patellar DTRs.  Skin: Skin is warm.  Psychiatric: She has a normal mood and affect. Her behavior is normal.  Nursing note and vitals reviewed.   ED Course  Procedures (including critical care time) Labs Review Labs Reviewed  CBC WITH DIFFERENTIAL/PLATELET - Abnormal; Notable for the following:    RBC 5.55 (*)    All other components within normal limits  COMPREHENSIVE METABOLIC PANEL - Abnormal; Notable for the following:    Calcium 8.6 (*)    All other components within normal limits  TROPONIN I  D-DIMER, QUANTITATIVE (NOT AT [facility])  CK    Imaging Review Dg Chest 2 View  10/27/2014   CLINICAL  DATA:  Acute chest pain.  EXAM: CHEST  2 VIEW  COMPARISON:  May 07, 2010.  FINDINGS: The heart size and mediastinal contours are within normal limits. Both lungs are clear. No pneumothorax or pleural effusion is noted. The visualized skeletal structures are unremarkable.  IMPRESSION: No active cardiopulmonary disease.   Electronically Signed   By: Marijo Conception, M.D.   On: 10/27/2014 17:21   Ct Angio Low Extrem Left W/cm &/or Wo/cm  10/27/2014   CLINICAL DATA:  Severe chest and low back pain.  Leg numbness.  EXAM: CT ANGIOGRAPHY CHEST, ABDOMEN AND PELVIS with bilateral lower extremity runoff.  TECHNIQUE: Multidetector CT imaging through the chest, abdomen, pelvis and bilateral lower extremities through the feet was performed using the standard protocol during bolus administration of intravenous contrast. Multiplanar reconstructed images and MIPs were obtained and reviewed to evaluate the vascular anatomy.  CONTRAST:  152mL OMNIPAQUE IOHEXOL 350 MG/ML SOLN  COMPARISON:  Chest radiograph earlier this day.  FINDINGS: CTA CHEST FINDINGS  Normal caliber thoracic aorta. No aneurysm or dissection. No aortic hematoma. No significant atheromatous plaque or calcification. There is a conventional branching pattern from the aortic arch.  There are no filling defects within the main pulmonary arteries to suggest pulmonary embolus. The heart size is normal. No mediastinal or hilar adenopathy. No pleural or pericardial effusion.  The lungs are clear. There is no consolidation, nodule or mass. No findings of pulmonary edema.  There are no acute or suspicious osseous abnormalities. Very minimal dextroscoliotic curvature of the midthoracic spine.  Review of the MIP images confirms the above findings.  CTA ABDOMEN AND PELVIS FINDINGS  Normal caliber abdominal aorta without aneurysm or dissection. No significant atheromatous plaque. The celiac, superior mesenteric, and inferior mesenteric arteries are widely patent. Single  bilateral renal arteries are patent.  Arterial phase imaging of the liver, spleen, adrenal glands, gallbladder, and pancreas are normal. Kidneys demonstrate symmetric enhancement. No hydronephrosis or perinephric stranding.  Stomach is physiologically distended. There are no dilated or thickened bowel loops. The appendix is normal. Small volume of colonic stool. No free air, free fluid, or intra-abdominal fluid collection.  Within the pelvis the uterus is enlarged and bulbous in appearance. There are uterine fibroids, with a dominant fibroid about the posterior fundus measuring at least 7.2 cm. An intrauterine device is seen. Prominent periuterine and adnexal vascularity. The ovaries are not discretely visualized. Urinary bladder is decompressed. No pelvic ascites.  Degenerative disc disease at L4-L5 with disc space narrowing, vacuum phenomenon and small Schmorl's nodes. There are no acute or suspicious osseous abnormalities.  Review of the MIP images confirms the above findings.  CTA bilateral lower extremity FINDINGS  Normal caliber bilateral common, internal, external iliac arteries. No dissection. Normal caliber bilateral superficial femoral, profunda, and popliteal arteries. There is tray vessel runoff to the level of the mid calf bilaterally. More distal arteries are small and not well opacified bilaterally. No evidence of abrupt occlusion, aneurysm or embolus.  Lower extremity musculature is symmetric and normal. No muscular edema. There is no mass or focal fluid collection. There are no acute or suspicious osseous abnormalities.  Review of the MIP images confirms the above findings.  IMPRESSION: 1. Normal appearance of the thoracoabdominal aorta without aneurysm or dissection. 2. Normal bilateral lower extremity runoff to the level of the mid calf, the distal most arteries are small in caliber and not well opacified bilaterally, no abrupt occlusion. 3. No acute abnormality in the chest abdomen or pelvis.  Incidental findings of uterine fibroids with prominent periuterine and adnexal vascularity, can be seen in the setting of pelvic congestion. 4. Degenerative disc disease at L4-L5.   Electronically Signed   By: Jeb Levering M.D.   On: 10/27/2014 22:54   Ct Angio Chest Aorta W/cm &/or Wo/cm  10/27/2014   CLINICAL DATA:  Severe chest and low back pain.  Leg numbness.  EXAM: CT ANGIOGRAPHY CHEST, ABDOMEN AND PELVIS with bilateral lower extremity runoff.  TECHNIQUE: Multidetector CT imaging through the chest, abdomen, pelvis and bilateral lower extremities through the feet was performed using the standard protocol during bolus administration of intravenous contrast. Multiplanar reconstructed images and MIPs were obtained and reviewed to evaluate the vascular anatomy.  CONTRAST:  123mL OMNIPAQUE IOHEXOL 350 MG/ML SOLN  COMPARISON:  Chest radiograph earlier this day.  FINDINGS: CTA CHEST FINDINGS  Normal caliber thoracic aorta. No aneurysm or dissection. No aortic hematoma. No significant atheromatous plaque or calcification. There is a conventional branching pattern from the aortic arch.  There are no filling defects within the main pulmonary arteries to suggest pulmonary embolus. The heart size is normal. No mediastinal or hilar adenopathy. No pleural or pericardial effusion.  The lungs are clear. There is no consolidation, nodule or mass. No findings of pulmonary edema.  There are no acute or suspicious osseous abnormalities. Very minimal dextroscoliotic curvature of the midthoracic spine.  Review of the MIP images confirms the above findings.  CTA ABDOMEN AND PELVIS FINDINGS  Normal caliber abdominal aorta without aneurysm or dissection. No significant atheromatous plaque. The celiac, superior mesenteric, and inferior mesenteric arteries are widely patent. Single bilateral renal arteries are patent.  Arterial phase imaging of the liver, spleen, adrenal glands, gallbladder, and pancreas are normal. Kidneys  demonstrate symmetric enhancement. No hydronephrosis or perinephric stranding.  Stomach is physiologically distended. There are no dilated or thickened bowel loops. The appendix is normal. Small volume of colonic stool. No free air, free fluid, or intra-abdominal fluid collection.  Within the pelvis the uterus is enlarged and bulbous in appearance. There are uterine fibroids, with a dominant fibroid about the posterior fundus measuring at least 7.2 cm. An intrauterine device is seen. Prominent periuterine and adnexal vascularity. The ovaries are not discretely visualized. Urinary bladder is decompressed. No pelvic ascites.  Degenerative disc disease at L4-L5 with disc space narrowing, vacuum phenomenon and small Schmorl's nodes. There are no acute or suspicious osseous abnormalities.  Review of the MIP images confirms the above findings.  CTA bilateral lower extremity FINDINGS  Normal caliber bilateral common, internal, external iliac arteries. No dissection. Normal caliber bilateral superficial femoral, profunda, and popliteal arteries. There is tray vessel runoff to the level of the mid calf bilaterally. More distal arteries are small and not well opacified bilaterally. No evidence of abrupt occlusion, aneurysm or embolus.  Lower extremity musculature is symmetric and normal. No muscular edema. There is no mass or focal fluid collection. There are no acute or suspicious osseous abnormalities.  Review of the MIP images confirms the above findings.  IMPRESSION: 1. Normal appearance of the thoracoabdominal aorta without aneurysm or dissection. 2. Normal bilateral lower extremity runoff to the level of the mid calf, the distal most arteries are small in caliber and not well opacified bilaterally, no abrupt occlusion. 3. No acute abnormality in the chest abdomen or pelvis. Incidental findings of uterine fibroids with prominent periuterine and adnexal vascularity, can be seen in the setting of pelvic congestion. 4.  Degenerative disc disease at L4-L5.   Electronically Signed   By: Jeb Levering M.D.   On: 10/27/2014 22:54   Ct Cta Abd/pel W/cm &/or W/o Cm  10/27/2014   CLINICAL DATA:  Severe chest and low back pain.  Leg numbness.  EXAM: CT ANGIOGRAPHY CHEST, ABDOMEN AND PELVIS with bilateral lower extremity runoff.  TECHNIQUE: Multidetector CT imaging through the chest, abdomen, pelvis and bilateral lower extremities through the feet was performed using the standard protocol during bolus administration of intravenous contrast. Multiplanar reconstructed images and MIPs were obtained and reviewed to evaluate the vascular anatomy.  CONTRAST:  171mL OMNIPAQUE IOHEXOL 350 MG/ML SOLN  COMPARISON:  Chest radiograph earlier this day.  FINDINGS: CTA CHEST FINDINGS  Normal caliber thoracic aorta. No aneurysm or dissection. No aortic hematoma. No significant atheromatous plaque or calcification. There is a conventional branching pattern from the aortic arch.  There are no filling defects within the main pulmonary arteries to suggest pulmonary embolus. The heart size is normal. No mediastinal or hilar adenopathy. No pleural or pericardial  effusion.  The lungs are clear. There is no consolidation, nodule or mass. No findings of pulmonary edema.  There are no acute or suspicious osseous abnormalities. Very minimal dextroscoliotic curvature of the midthoracic spine.  Review of the MIP images confirms the above findings.  CTA ABDOMEN AND PELVIS FINDINGS  Normal caliber abdominal aorta without aneurysm or dissection. No significant atheromatous plaque. The celiac, superior mesenteric, and inferior mesenteric arteries are widely patent. Single bilateral renal arteries are patent.  Arterial phase imaging of the liver, spleen, adrenal glands, gallbladder, and pancreas are normal. Kidneys demonstrate symmetric enhancement. No hydronephrosis or perinephric stranding.  Stomach is physiologically distended. There are no dilated or thickened  bowel loops. The appendix is normal. Small volume of colonic stool. No free air, free fluid, or intra-abdominal fluid collection.  Within the pelvis the uterus is enlarged and bulbous in appearance. There are uterine fibroids, with a dominant fibroid about the posterior fundus measuring at least 7.2 cm. An intrauterine device is seen. Prominent periuterine and adnexal vascularity. The ovaries are not discretely visualized. Urinary bladder is decompressed. No pelvic ascites.  Degenerative disc disease at L4-L5 with disc space narrowing, vacuum phenomenon and small Schmorl's nodes. There are no acute or suspicious osseous abnormalities.  Review of the MIP images confirms the above findings.  CTA bilateral lower extremity FINDINGS  Normal caliber bilateral common, internal, external iliac arteries. No dissection. Normal caliber bilateral superficial femoral, profunda, and popliteal arteries. There is tray vessel runoff to the level of the mid calf bilaterally. More distal arteries are small and not well opacified bilaterally. No evidence of abrupt occlusion, aneurysm or embolus.  Lower extremity musculature is symmetric and normal. No muscular edema. There is no mass or focal fluid collection. There are no acute or suspicious osseous abnormalities.  Review of the MIP images confirms the above findings.  IMPRESSION: 1. Normal appearance of the thoracoabdominal aorta without aneurysm or dissection. 2. Normal bilateral lower extremity runoff to the level of the mid calf, the distal most arteries are small in caliber and not well opacified bilaterally, no abrupt occlusion. 3. No acute abnormality in the chest abdomen or pelvis. Incidental findings of uterine fibroids with prominent periuterine and adnexal vascularity, can be seen in the setting of pelvic congestion. 4. Degenerative disc disease at L4-L5.   Electronically Signed   By: Jeb Levering M.D.   On: 10/27/2014 22:54     EKG Interpretation   Date/Time:   Monday October 27 2014 16:16:37 EDT Ventricular Rate:  91 PR Interval:  126 QRS Duration: 90 QT Interval:  361 QTC Calculation: 444 R Axis:   -12 Text Interpretation:  Sinus rhythm No significant change was found  Confirmed by Wyvonnia Dusky  MD, [place] [phone]) on 10/27/2014 4:20:27 PM      MDM   Final diagnoses:  Numbness  Atypical chest pain   12 hours hours of left sided chest wall pain worse with palpation and movement. EKG normal sinus rhythm.   Chest pain is reproducible. D-dimer is negative. Chest x-rays negative. Low suspicion for ACS or PE. Chest pain is atypical for ACS, has been constant and reproducible.   Patient continues to complain of bilateral hand paresthesias to have progressed to involve her lower extremities as well. No focal weakness on exam.  There is difficulty obtaining her deep tendon reflexes but they are present. Discussed with Dr. Janann Colonel of neurology. He feels outpatient neurology workup as appropriate including MRI. No indication for lumbar puncture today. Dr. Janann Colonel came  to bedside and was able to elicit patellar DTRs.  CTA does not show any significant vascular occlusion.  D/w Dr> early as contract does not extend past tibia but no occlusion seen. Dr. Donnetta Hutching feels that with no abnormal runoff to tibial arteries, this is consistent with patent vessels.  D/w Dr. Doy Mince.  She agrees that patient would benefit from further neuro workup as outpatient.  No indication for MRI tonight as no concern for stroke and if MS would not change disposition.  D/w patient.  Possibility of guillain barre considered but no focal weakness or ascending paralysis.  Reflexes decreased.  She is able to ambulate.  D/w Patient that LP could be performed to evaluate for remote possibility of GBS.  She declines this and wishes to follow up with neurology. She will need EMG as well.  Return precautions discussed.  Ezequiel Essex, MD 10/28/14 [phone]

## 2014-10-27 NOTE — ED Notes (Signed)
Pt ambulated to bathroom with no assistance. Pt tolerated well.  

## 2014-10-27 NOTE — ED Notes (Signed)
Pt states she had a sudden onset of chest pain that woke up up this am around 0430. Pt states pain was in "whole thoracic cavity from back into chest" Denies any SOB nausea or diaphoresis. Ems gave 325mg  ASA and 2 SL nitro tablets. Pain is 3/10 in left side of chest at this time.

## 2014-10-27 NOTE — ED Notes (Signed)
Pt ambulated to restroom with no assistance. CAOx4 throughout. Pt tolerated well.

## 2014-10-27 NOTE — ED Notes (Signed)
Pulses check with the doppler able to get a pulse on right leg, but not on left leg. Able to find pulse with doppler on the left arm, but not on the left arm.

## 2014-10-27 NOTE — Discharge Instructions (Signed)
Paresthesia Call the neurologist tomorrow for an appointment as soon as possible. Return to the ED if you develop weakness, worsening numbness, tingling or any other concerns. Paresthesia is an abnormal burning or prickling sensation. This sensation is generally felt in the hands, arms, legs, or feet. However, it may occur in any part of the body. It is usually not painful. The feeling may be described as:  Tingling or numbness.  "Pins and needles."  Skin crawling.  Buzzing.  Limbs "falling asleep."  Itching. Most people experience temporary (transient) paresthesia at some time in their lives. CAUSES  Paresthesia may occur when you breathe too quickly (hyperventilation). It can also occur without any apparent cause. Commonly, paresthesia occurs when pressure is placed on a nerve. The feeling quickly goes away once the pressure is removed. For some people, however, paresthesia is a long-lasting (chronic) condition caused by an underlying disorder. The underlying disorder may be:  A traumatic, direct injury to nerves. Examples include a:  Broken (fractured) neck.  Fractured skull.  A disorder affecting the brain and spinal cord (central nervous system). Examples include:  Transverse myelitis.  Encephalitis.  Transient ischemic attack.  Multiple sclerosis.  Stroke.  Tumor or blood vessel problems, such as an arteriovenous malformation pressing against the brain or spinal cord.  A condition that damages the peripheral nerves (peripheral neuropathy). Peripheral nerves are not part of the brain and spinal cord. These conditions include:  Diabetes.  Peripheral vascular disease.  Nerve entrapment syndromes, such as carpal tunnel syndrome.  Shingles.  Hypothyroidism.  Vitamin B12 deficiencies.  Alcoholism.  Heavy metal poisoning (lead, arsenic).  Rheumatoid arthritis.  Systemic lupus erythematosus. DIAGNOSIS  Your caregiver will attempt to find the underlying cause  of your paresthesia. Your caregiver may:  Take your medical history.  Perform a physical exam.  Order various lab tests.  Order imaging tests. TREATMENT  Treatment for paresthesia depends on the underlying cause. HOME CARE INSTRUCTIONS  Avoid drinking alcohol.  You may consider massage or acupuncture to help relieve your symptoms.  Keep all follow-up appointments as directed by your caregiver. SEEK IMMEDIATE MEDICAL CARE IF:   You feel weak.  You have trouble walking or moving.  You have problems with speech or vision.  You feel confused.  You cannot control your bladder or bowel movements.  You feel numbness after an injury.  You faint.  Your burning or prickling feeling gets worse when walking.  You have pain, cramps, or dizziness.  You develop a rash. MAKE SURE YOU:  Understand these instructions.  Will watch your condition.  Will get help right away if you are not doing well or get worse. Document Released: 04/08/2002 Document Revised: 07/11/2011 Document Reviewed: 01/07/2011 [facility] Patient Information 2015 [place], [place]. This information is not intended to replace advice given to you by your health care provider. Make sure you discuss any questions you have with your health care provider.

## 2014-10-29 ENCOUNTER — Encounter: Payer: Self-pay | Admitting: *Deleted

## 2014-10-30 ENCOUNTER — Encounter: Payer: Self-pay | Admitting: Neurology

## 2014-10-30 ENCOUNTER — Ambulatory Visit (INDEPENDENT_AMBULATORY_CARE_PROVIDER_SITE_OTHER): Payer: Medicaid Other | Admitting: Neurology

## 2014-10-30 VITALS — BP 148/82 | HR 80 | Resp 14 | Ht 69.0 in | Wt 216.4 lb

## 2014-10-30 DIAGNOSIS — G4700 Insomnia, unspecified: Secondary | ICD-10-CM | POA: Diagnosis not present

## 2014-10-30 DIAGNOSIS — R200 Anesthesia of skin: Secondary | ICD-10-CM | POA: Diagnosis not present

## 2014-10-30 DIAGNOSIS — F419 Anxiety disorder, unspecified: Secondary | ICD-10-CM | POA: Diagnosis not present

## 2014-10-30 DIAGNOSIS — R413 Other amnesia: Secondary | ICD-10-CM | POA: Diagnosis not present

## 2014-10-30 DIAGNOSIS — R5383 Other fatigue: Secondary | ICD-10-CM | POA: Diagnosis not present
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
## 2014-10-31 LAB — T4: T4, Total: 7.7 ug/dL (ref 4.5–12.0)

## 2014-10-31 LAB — TSH: TSH: 1.8 u[IU]/mL (ref 0.450–4.500)

## 2014-10-31 LAB — SEDIMENTATION RATE: Sed Rate: 16 mm/hr (ref 0–32)

## 2014-11-05 ENCOUNTER — Encounter (INDEPENDENT_AMBULATORY_CARE_PROVIDER_SITE_OTHER): Payer: Medicaid Other | Admitting: Diagnostic Neuroimaging

## 2014-11-05 ENCOUNTER — Other Ambulatory Visit: Payer: Medicaid Other

## 2014-11-05 ENCOUNTER — Ambulatory Visit
Admission: RE | Admit: 2014-11-05 | Discharge: 2014-11-05 | Disposition: A | Discharge status: Home / Self Care | Payer: Medicaid Other | Source: Ambulatory Visit | Attending: Neurology | Admitting: Neurology

## 2014-11-05 DIAGNOSIS — R200 Anesthesia of skin: Secondary | ICD-10-CM

## 2014-11-05 DIAGNOSIS — R413 Other amnesia: Secondary | ICD-10-CM

## 2014-11-05 MED ORDER — GADOBENATE DIMEGLUMINE 529 MG/ML IV SOLN
20.0000 mL | Freq: Once | INTRAVENOUS | Status: AC | PRN
  Administered 2014-11-05: 20 mL via INTRAVENOUS

## 2014-11-07 ENCOUNTER — Telehealth: Payer: Self-pay | Admitting: Neurology

## 2014-11-07 NOTE — Telephone Encounter (Signed)
Patient called requesting to speak with Faith RN regarding MRI results. Please call and advise.

## 2014-11-07 NOTE — Telephone Encounter (Signed)
I have spoken with Maria Ho this morning and per RAS, advised that insurance would not cover the mri c-spine, and that he felt he had enough information without it.  I advised, per RAS, that mri brain showed no evidence of MS, but did show a few spots that are usually associated with migraine h/a's, that these spots are not in the part of her brain that would cause her sx.  She verbalized understanding of same, sts. she would like to pursue other tests to dx. cause of her sx.  I  have given earlier appt. for next week/fim

## 2014-11-11 ENCOUNTER — Ambulatory Visit: Payer: Self-pay | Admitting: Neurology

## 2014-11-12 ENCOUNTER — Encounter: Payer: Self-pay | Admitting: Neurology

## 2014-11-27 ENCOUNTER — Encounter: Payer: Self-pay | Admitting: Neurology

## 2014-12-11 ENCOUNTER — Ambulatory Visit: Payer: Medicaid Other | Admitting: Neurology

## 2015-02-17 ENCOUNTER — Encounter (HOSPITAL_BASED_OUTPATIENT_CLINIC_OR_DEPARTMENT_OTHER): Payer: Self-pay

## 2015-02-17 ENCOUNTER — Emergency Department (HOSPITAL_BASED_OUTPATIENT_CLINIC_OR_DEPARTMENT_OTHER)
Admission: EM | Admit: 2015-02-17 | Discharge: 2015-02-17 | Disposition: A | Discharge status: Home / Self Care | Payer: Medicaid Other | Attending: Emergency Medicine | Admitting: Emergency Medicine

## 2015-02-17 DIAGNOSIS — Z8659 Personal history of other mental and behavioral disorders: Secondary | ICD-10-CM | POA: Insufficient documentation

## 2015-02-17 DIAGNOSIS — Z8679 Personal history of other diseases of the circulatory system: Secondary | ICD-10-CM | POA: Insufficient documentation

## 2015-02-17 DIAGNOSIS — Z8619 Personal history of other infectious and parasitic diseases: Secondary | ICD-10-CM | POA: Insufficient documentation

## 2015-02-17 DIAGNOSIS — G43809 Other migraine, not intractable, without status migrainosus: Secondary | ICD-10-CM | POA: Insufficient documentation

## 2015-02-17 DIAGNOSIS — R51 Headache: Secondary | ICD-10-CM | POA: Diagnosis present

## 2015-02-17 MED ORDER — DIPHENHYDRAMINE HCL 50 MG/ML IJ SOLN
12.5000 mg | Freq: Once | INTRAMUSCULAR | Status: AC
  Administered 2015-02-17: 12.5 mg via INTRAVENOUS
  Filled 2015-02-17: qty 1

## 2015-02-17 MED ORDER — KETOROLAC TROMETHAMINE 30 MG/ML IJ SOLN
15.0000 mg | Freq: Once | INTRAMUSCULAR | Status: AC
  Administered 2015-02-17: 15 mg via INTRAVENOUS
  Filled 2015-02-17: qty 1

## 2015-02-17 MED ORDER — PROCHLORPERAZINE EDISYLATE 5 MG/ML IJ SOLN
10.0000 mg | Freq: Four times a day (QID) | INTRAMUSCULAR | Status: DC | PRN
  Administered 2015-02-17: 10 mg via INTRAVENOUS
  Filled 2015-02-17: qty 2

## 2015-02-17 MED ORDER — FENTANYL CITRATE (PF) 100 MCG/2ML IJ SOLN
50.0000 ug | Freq: Once | INTRAMUSCULAR | Status: AC
  Administered 2015-02-17: 50 ug via INTRAVENOUS
  Filled 2015-02-17: qty 2

## 2015-02-17 NOTE — ED Notes (Signed)
MD at bedside. 

## 2015-02-17 NOTE — ED Notes (Signed)
Pt presents to ED with steady gait wearing sunglasses-c/o migraine x today

## 2015-02-17 NOTE — Discharge Instructions (Signed)

## 2015-02-17 NOTE — ED Provider Notes (Signed)
CSN: [id number]     Arrival date & time 02/17/15  1614 History   First MD Initiated Contact with Patient 02/17/15 1649     Chief Complaint  Patient presents with  . Migraine      HPI Patient presents with migraine headache today.  Nausea but no definite vomiting.  No fever no chills no stiff neck.  History of migraines in the past.  Patient took Imitrex with no relief. Past Medical History  Diagnosis Date  . Anxiety     PTSD  . Headache(784.0)     Migraines  . Arrhythmia   . HSV (herpes simplex virus) infection     no outbreak during current pregnancy  . Migraine    Past Surgical History  Procedure Laterality Date  . None    . No past surgeries     Family History  Problem Relation Age of Onset  . Post-traumatic stress disorder Father   . Alcohol abuse Father   . Drug abuse Father   . Diabetes Maternal Grandmother   . Cancer Paternal Grandmother     Great-Grandparent  . Cancer Paternal Grandfather     Great-Gradnparent  . Healthy Mother    Social History  Substance Use Topics  . Smoking status: Never Smoker   . Smokeless tobacco: Never Used  . Alcohol Use: Yes   OB History    Gravida Para Term Preterm AB TAB SAB Ectopic Multiple Living   14 4 4  10 9 1   4      Review of Systems  All other systems reviewed and are negative  Allergies  Review of patient's allergies indicates no known allergies.  Home Medications   Prior to Admission medications   Not on File   BP 138/86 mmHg  Pulse 62  Temp(Src) 97.8 F (36.6 C) (Oral)  Resp 16  Ht 5\' 7"  (1.702 m)  Wt 210 lb (95.255 kg)  BMI 32.88 kg/m2  SpO2 100%  LMP 02/09/2015 Physical Exam Physical Exam  Nursing note and vitals reviewed. Constitutional: She is oriented to person, place, and time. She appears well-developed and well-nourished. No distress.  HENT:  Head: Normocephalic and atraumatic.  Eyes: Pupils are equal, round, and reactive to light.  Neck: Normal range of motion.  Cardiovascular:  Normal rate and intact distal pulses.   Pulmonary/Chest: No respiratory distress.  Abdominal: Normal appearance. She exhibits no distension.  Musculoskeletal: Normal range of motion.  Neurological: She is alert and oriented to person, place, and time. No cranial nerve deficit.  Skin: Skin is warm and dry. No rash noted.  Psychiatric: She has a normal mood and affect. Her behavior is normal.   ED Course  Procedures (including critical care time) Medications  prochlorperazine (COMPAZINE) injection 10 mg (10 mg Intravenous Given 02/17/15 1717)  fentaNYL (SUBLIMAZE) injection 50 mcg (not administered)  diphenhydrAMINE (BENADRYL) injection 12.5 mg (12.5 mg Intravenous Given 02/17/15 1714)  ketorolac (TORADOL) 30 MG/ML injection 15 mg (15 mg Intravenous Given 02/17/15 1716)    Labs Review Labs Reviewed - No data to display  Imaging Review No results found. I have personally reviewed and evaluated these images and lab results as part of my medical decision-making.   EKG Interpretation None     After treatment in the ED the patient feels back to baseline and wants to go home. MDM   Final diagnoses:  Other migraine without status migrainosus, not intractable        Leonard Schwartz, MD 02/17/15 [phone]

## 2015-03-10 ENCOUNTER — Emergency Department (HOSPITAL_BASED_OUTPATIENT_CLINIC_OR_DEPARTMENT_OTHER)
Admission: EM | Admit: 2015-03-10 | Discharge: 2015-03-10 | Disposition: A | Discharge status: Home / Self Care | Payer: Medicaid Other | Attending: Emergency Medicine | Admitting: Emergency Medicine

## 2015-03-10 ENCOUNTER — Encounter (HOSPITAL_BASED_OUTPATIENT_CLINIC_OR_DEPARTMENT_OTHER): Payer: Self-pay | Admitting: *Deleted

## 2015-03-10 DIAGNOSIS — R51 Headache: Secondary | ICD-10-CM | POA: Diagnosis present

## 2015-03-10 DIAGNOSIS — Z8659 Personal history of other mental and behavioral disorders: Secondary | ICD-10-CM | POA: Diagnosis not present

## 2015-03-10 DIAGNOSIS — Z8679 Personal history of other diseases of the circulatory system: Secondary | ICD-10-CM | POA: Insufficient documentation

## 2015-03-10 DIAGNOSIS — Z8619 Personal history of other infectious and parasitic diseases: Secondary | ICD-10-CM | POA: Insufficient documentation

## 2015-03-10 DIAGNOSIS — G43809 Other migraine, not intractable, without status migrainosus: Secondary | ICD-10-CM | POA: Diagnosis not present

## 2015-03-10 MED ORDER — KETOROLAC TROMETHAMINE 30 MG/ML IJ SOLN
30.0000 mg | Freq: Once | INTRAMUSCULAR | Status: AC
  Administered 2015-03-10: 30 mg via INTRAVENOUS
  Filled 2015-03-10: qty 1

## 2015-03-10 MED ORDER — PROCHLORPERAZINE EDISYLATE 5 MG/ML IJ SOLN
10.0000 mg | Freq: Four times a day (QID) | INTRAMUSCULAR | Status: DC | PRN
Start: 2015-03-10 — End: 2015-03-11
  Administered 2015-03-10: 10 mg via INTRAVENOUS
  Filled 2015-03-10: qty 2

## 2015-03-10 MED ORDER — SODIUM CHLORIDE 0.9 % IV BOLUS (SEPSIS)
1000.0000 mL | Freq: Once | INTRAVENOUS | Status: AC
  Administered 2015-03-10: 1000 mL via INTRAVENOUS

## 2015-03-10 MED ORDER — FENTANYL CITRATE (PF) 100 MCG/2ML IJ SOLN
25.0000 ug | Freq: Once | INTRAMUSCULAR | Status: AC
  Administered 2015-03-10: 25 ug via INTRAVENOUS
  Filled 2015-03-10: qty 2

## 2015-03-10 MED ORDER — DIPHENHYDRAMINE HCL 50 MG/ML IJ SOLN
25.0000 mg | Freq: Once | INTRAMUSCULAR | Status: AC
  Administered 2015-03-10: 25 mg via INTRAVENOUS
  Filled 2015-03-10: qty 1

## 2015-03-10 NOTE — ED Provider Notes (Signed)
CSN: [id number]   Arrival date & time 03/10/15 1656  History  By signing my name below, I, [name], attest that this documentation has been prepared under the direction and in the presence of Harvel Quale, MD. Electronically Signed: [name], ED Scribe. 03/10/2015. 7:38 PM.  Chief Complaint  Patient presents with  . Headache    HPI The history is provided by the patient. No language interpreter was used.   Maria Ho is a 34 y.o. female with history of migraines who presents to the Emergency Department complaining of a worsening diffuse headache with onset around 9:30 AM. This headache is typical of her usual migraines and was not improved by Imitrex. She rates the pain 7/10 in severity. Associated symptoms include nausea and photophobia. Pt denies fever, vomiting, phonophobia, and weakness. Pt requesting "a migraine cocktail and narcotics, or something stronger".   Past Medical History  Diagnosis Date  . Anxiety     PTSD  . Headache(784.0)     Migraines  . Arrhythmia   . HSV (herpes simplex virus) infection     no outbreak during current pregnancy  . Migraine     Past Surgical History  Procedure Laterality Date  . None    . No past surgeries      Family History  Problem Relation Age of Onset  . Post-traumatic stress disorder Father   . Alcohol abuse Father   . Drug abuse Father   . Diabetes Maternal Grandmother   . Cancer Paternal Grandmother     Great-Grandparent  . Cancer Paternal Grandfather     Great-Gradnparent  . Healthy Mother     Social History  Substance Use Topics  . Smoking status: Never Smoker   . Smokeless tobacco: Never Used  . Alcohol Use: Yes     Review of Systems  Constitutional: Negative for fever, chills and fatigue.  HENT: Negative for congestion, postnasal drip, sore throat and voice change.   Eyes: Positive for photophobia. Negative for visual disturbance.  Respiratory: Negative for cough, chest tightness and  shortness of breath.   Cardiovascular: Negative for chest pain and palpitations.  Gastrointestinal: Positive for nausea. Negative for vomiting, abdominal pain and diarrhea.  Genitourinary: Negative for dysuria, urgency and hematuria.  Musculoskeletal: Negative for back pain and neck stiffness.  Skin: Negative for rash.  Neurological: Positive for headaches. Negative for dizziness, speech difficulty and weakness.   Home Medications   Prior to Admission medications   Medication Sig Start Date End Date Taking? Authorizing Provider  SUMAtriptan Succinate (IMITREX PO) Take by mouth.   Yes Historical Provider, MD    Allergies  Review of patient's allergies indicates no known allergies.  Triage Vitals: BP 118/82 mmHg  Pulse 102  Temp(Src) 98.4 F (36.9 C) (Oral)  Ht 5\' 9"  (1.753 m)  Wt 212 lb (96.163 kg)  BMI 31.29 kg/m2  SpO2 100%  LMP 03/04/2015  Physical Exam  Constitutional: She is oriented to person, place, and time. She appears well-developed and well-nourished. No distress.  HENT:  Head: Normocephalic and atraumatic.  Right Ear: External ear normal.  Left Ear: External ear normal.  Nose: Nose normal.  Mouth/Throat: Oropharynx is clear and moist. No oropharyngeal exudate.  Eyes: EOM are normal. Pupils are equal, round, and reactive to light.  Neck: Normal range of motion. Neck supple.  Cardiovascular: Normal rate, regular rhythm, normal heart sounds and intact distal pulses.   No murmur heard. Pulmonary/Chest: Effort normal and breath sounds normal. No respiratory distress.  She has no wheezes. She has no rales.  Abdominal: Soft. She exhibits no distension. There is no tenderness.  Musculoskeletal: Normal range of motion. She exhibits no edema or tenderness.  Neurological: She is alert and oriented to person, place, and time. She has normal strength. No cranial nerve deficit or sensory deficit. She exhibits normal muscle tone. Coordination normal.  Skin: Skin is warm and  dry. No rash noted. She is not diaphoretic.  Psychiatric: She has a normal mood and affect. Judgment normal.  Nursing note and vitals reviewed.   ED Course  Procedures   DIAGNOSTIC STUDIES: Oxygen Saturation is 100% on RA, normal by my interpretation.    COORDINATION OF CARE: 7:28 PM Discussed treatment plan which includes a migraine cocktail with pt at bedside and pt agreed to plan.  Labs Reviewed - No data to display  Imaging Review No results found.    MDM  Patient seen and evaluated in stable condition.  Typical migraine symptoms.  Non toxic, normal vitals.  Patient given IV fluids, toradol, benadryl, compazine, fentanyl with improvement.  Was requesting discharge.  Discharged home in stable condition. Final diagnoses:  Other type of nonintractable migraine    1. Migraine  I personally performed the services described in this documentation, which was scribed in my presence. The recorded information has been reviewed and is accurate.  Harvel Quale, MD 03/11/15 [phone]

## 2015-03-10 NOTE — ED Notes (Signed)
Pt is feeling better and request to leave.

## 2015-03-10 NOTE — ED Notes (Addendum)
Has taken 2 Imitrex today last one at 220.  Denies N/V/D no fever.

## 2015-03-10 NOTE — Discharge Instructions (Signed)

## 2015-03-10 NOTE — ED Notes (Signed)
Headache since this am.

## 2016-01-02 ENCOUNTER — Emergency Department (HOSPITAL_COMMUNITY)
Admission: EM | Admit: 2016-01-02 | Discharge: 2016-01-03 | Disposition: A | Discharge status: Other Acute Inpatient Hospital | Payer: Medicaid Other | Attending: Emergency Medicine | Admitting: Emergency Medicine

## 2016-01-02 ENCOUNTER — Encounter (HOSPITAL_COMMUNITY): Payer: Self-pay

## 2016-01-02 DIAGNOSIS — R45851 Suicidal ideations: Secondary | ICD-10-CM

## 2016-01-02 DIAGNOSIS — F4312 Post-traumatic stress disorder, chronic: Secondary | ICD-10-CM

## 2016-01-02 DIAGNOSIS — Z5181 Encounter for therapeutic drug level monitoring: Secondary | ICD-10-CM | POA: Insufficient documentation

## 2016-01-02 DIAGNOSIS — F332 Major depressive disorder, recurrent severe without psychotic features: Secondary | ICD-10-CM

## 2016-01-02 DIAGNOSIS — F4321 Adjustment disorder with depressed mood: Secondary | ICD-10-CM | POA: Insufficient documentation

## 2016-01-02 DIAGNOSIS — F1290 Cannabis use, unspecified, uncomplicated: Secondary | ICD-10-CM | POA: Insufficient documentation

## 2016-01-02 LAB — RAPID URINE DRUG SCREEN, HOSP PERFORMED
AMPHETAMINES: NOT DETECTED
BENZODIAZEPINES: NOT DETECTED
Barbiturates: NOT DETECTED
Cocaine: NOT DETECTED
OPIATES: NOT DETECTED
Tetrahydrocannabinol: POSITIVE — AB

## 2016-01-02 LAB — COMPREHENSIVE METABOLIC PANEL
ALK PHOS: 64 U/L (ref 38–126)
ALT: 14 U/L (ref 14–54)
ANION GAP: 8 (ref 5–15)
AST: 16 U/L (ref 15–41)
Albumin: 4.2 g/dL (ref 3.5–5.0)
BILIRUBIN TOTAL: 0.2 mg/dL — AB (ref 0.3–1.2)
BUN: 10 mg/dL (ref 6–20)
CALCIUM: 9 mg/dL (ref 8.9–10.3)
CO2: 23 mmol/L (ref 22–32)
Chloride: 110 mmol/L (ref 101–111)
Creatinine, Ser: 0.63 mg/dL (ref 0.44–1.00)
Glucose, Bld: 97 mg/dL (ref 65–99)
Potassium: 3.7 mmol/L (ref 3.5–5.1)
Sodium: 141 mmol/L (ref 135–145)
TOTAL PROTEIN: 8 g/dL (ref 6.5–8.1)

## 2016-01-02 LAB — CBC
HCT: 46.8 % — ABNORMAL HIGH (ref 36.0–46.0)
Hemoglobin: 14.9 g/dL (ref 12.0–15.0)
MCH: 26 pg (ref 26.0–34.0)
MCHC: 31.8 g/dL (ref 30.0–36.0)
MCV: 81.7 fL (ref 78.0–100.0)
PLATELETS: 299 10*3/uL (ref 150–400)
RBC: 5.73 MIL/uL — AB (ref 3.87–5.11)
RDW: 14.5 % (ref 11.5–15.5)
WBC: 11.2 10*3/uL — AB (ref 4.0–10.5)

## 2016-01-02 LAB — ETHANOL: Alcohol, Ethyl (B): <5 mg/dL (ref <5)

## 2016-01-02 LAB — POC URINE PREG, ED: Preg Test, Ur: NEGATIVE

## 2016-01-02 MED ORDER — ACETAMINOPHEN 325 MG PO TABS
650.0000 mg | ORAL_TABLET | ORAL | Status: DC | PRN
  Administered 2016-01-03 (×2): 650 mg via ORAL
  Filled 2016-01-02 (×2): qty 2

## 2016-01-02 MED ORDER — METOCLOPRAMIDE HCL 10 MG PO TABS
10.0000 mg | ORAL_TABLET | Freq: Once | ORAL | Status: AC
  Administered 2016-01-02: 10 mg via ORAL
  Filled 2016-01-02: qty 1

## 2016-01-02 MED ORDER — IBUPROFEN 800 MG PO TABS
800.0000 mg | ORAL_TABLET | Freq: Once | ORAL | Status: AC
  Administered 2016-01-02: 800 mg via ORAL
  Filled 2016-01-02: qty 1

## 2016-01-02 MED ORDER — DIPHENHYDRAMINE HCL 25 MG PO CAPS
25.0000 mg | ORAL_CAPSULE | Freq: Once | ORAL | Status: AC
  Administered 2016-01-02: 25 mg via ORAL
  Filled 2016-01-02: qty 1

## 2016-01-02 NOTE — ED Provider Notes (Signed)
[facility] DEPT Provider Note   CSN: [id number] Arrival date & time: 01/02/16  1343     History   Chief Complaint Chief Complaint  Patient presents with  . Suicidal    HPI Maria Ho is a 35 y.o. female.  The history is provided by the patient. No language interpreter was used.  Maria Ho is a 35 y.o. female who presents to the Emergency Department complaining of depression, SI.  She reports increased depression and suicidal ideation lately. She states this has been ongoing over the last several days due to increased stress but she is unwilling to state what makes her feel this way. Today she was found at the top of a parking deck sitting on the ground. She endorses feelings of not wanting to live anymore. Symptoms are severe and constant.  Past Medical History:  Diagnosis Date  . Anxiety    PTSD  . Arrhythmia   . Headache(784.0)    Migraines  . HSV (herpes simplex virus) infection    no outbreak during current pregnancy  . Migraine     Patient Active Problem List   Diagnosis Date Noted  . Numbness 10/30/2014  . Memory disturbance 10/30/2014  . Other fatigue 10/30/2014  . Anxiety 05/06/2013  . Dermatitis, eczematoid 05/06/2013  . Genital herpes 05/06/2013  . Cannot sleep 05/06/2013  . Oral herpes 05/06/2013  . Palpitations 06/15/2011  . Bleeding in early pregnancy 04/07/2011    Past Surgical History:  Procedure Laterality Date  . NO PAST SURGERIES    . None      OB History    Gravida Para Term Preterm AB Living   14 4 4   10 4    SAB TAB Ectopic Multiple Live Births   1 9     4        Home Medications    Prior to Admission medications   Medication Sig Start Date End Date Taking? Authorizing Provider  Aspirin-Acetaminophen-Caffeine (GOODY HEADACHE PO) Take 1-2 packets by mouth daily as needed (headaches).   Yes Historical Provider, MD  diphenhydrAMINE (BENADRYL) 25 MG tablet Take 25-50 mg by mouth at bedtime as needed for sleep.   Yes  Historical Provider, MD  ibuprofen (ADVIL,MOTRIN) 200 MG tablet Take 400 mg by mouth every 6 (six) hours as needed for headache or moderate pain.   Yes Historical Provider, MD  Melatonin 3 MG TABS Take 3 mg by mouth at bedtime as needed (sleep).   Yes Historical Provider, MD    Family History Family History  Problem Relation Age of Onset  . Post-traumatic stress disorder Father   . Alcohol abuse Father   . Drug abuse Father   . Healthy Mother   . Diabetes Maternal Grandmother   . Cancer Paternal Grandmother     Great-Grandparent  . Cancer Paternal Grandfather     Great-Gradnparent    Social History Social History  Substance Use Topics  . Smoking status: Never Smoker  . Smokeless tobacco: Never Used  . Alcohol use Yes     Allergies   Review of patient's allergies indicates no known allergies.   Review of Systems Review of Systems  All other systems reviewed and are negative.    Physical Exam Updated Vital Signs BP (!) 149/103 (BP Location: Left Arm)   Pulse 86   Temp 98.7 F (37.1 C) (Oral)   Resp 16   Ht 5\' 9"  (1.753 m)   Wt 160 lb (72.6 kg)   LMP 12/29/2015 (Exact  Date)   SpO2 96%   BMI 23.63 kg/m   Physical Exam  Constitutional: She is oriented to person, place, and time. She appears well-developed and well-nourished.  HENT:  Head: Normocephalic and atraumatic.  Cardiovascular: Normal rate and regular rhythm.   Pulmonary/Chest: Effort normal. No respiratory distress.  Musculoskeletal: Normal range of motion.  Neurological: She is alert and oriented to person, place, and time.  Skin: Skin is warm.  Psychiatric:  Flat affect  Nursing note and vitals reviewed.    ED Treatments / Results  Labs (all labs ordered are listed, but only abnormal results are displayed) Labs Reviewed  COMPREHENSIVE METABOLIC PANEL - Abnormal; Notable for the following:       Result Value   Total Bilirubin 0.2 (*)    All other components within normal limits  CBC -  Abnormal; Notable for the following:    WBC 11.2 (*)    RBC 5.73 (*)    HCT 46.8 (*)    All other components within normal limits  URINE RAPID DRUG SCREEN, HOSP PERFORMED - Abnormal; Notable for the following:    Tetrahydrocannabinol POSITIVE (*)    All other components within normal limits  ETHANOL  POC URINE PREG, ED    EKG  EKG Interpretation None       Radiology No results found.  Procedures Procedures (including critical care time)  Medications Ordered in ED Medications  ibuprofen (ADVIL,MOTRIN) tablet 800 mg (800 mg Oral Given 01/02/16 1614)     Initial Impression / Assessment and Plan / ED Course  I have reviewed the triage vital signs and the nursing notes.  Pertinent labs & imaging results that were available during my care of the patient were reviewed by me and considered in my medical decision making (see chart for details).  Clinical Course   Patient here with suicidal thoughts with plan to jump off a parking deck.  Pt medically cleared for psychiatric treatment.    Final Clinical Impressions(s) / ED Diagnoses   Final diagnoses:  Suicidal ideation    New Prescriptions New Prescriptions   No medications on file     Quintella Reichert, MD 01/02/16 1821

## 2016-01-02 NOTE — ED Notes (Signed)
SBAR Report received from previous nurse. Pt received calm and visible on unit. Pt denies current SI/ HI, A/V H, depression, anxiety, and rates pain 8/10 headache at this time, and is otherwise stable. Pt reminded of camera surveillance, q 15 min rounds, and rules of the milieu. Will continue to assess.

## 2016-01-02 NOTE — ED Notes (Signed)
Pt c/o HA. EDP notified.
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
## 2016-01-02 NOTE — BH Assessment (Signed)
Assessment completed. Consulted with Serena Colonel, NP who recommends overnight observation and evaluation by psychiatry in the morning.   Rosalin Hawking, LCSW Therapeutic Triage Specialist [place] 01/02/2016 7:56 PM

## 2016-01-02 NOTE — ED Notes (Signed)
Pt admitted to room #39. Pt behavior calm and cooperative, pleasant on approach, sad affect. Pt reports she came to the hospital d/t "me crying on the parking deck probably wasn't the best look for me to a random citizen." Pt reports passive SI. Pt denies HI. Denies AVH. Reports increase in depression. Pt reports a recent break up with her partners causing her stress. Pt reports she is attending grad school at [employer] and currently works at [employer]. Pt reports weekly THC use. Pt denies hx of violence. Pt reports a positive support system. Special checks q 15 mins in place for safety. Video monitoring in place.

## 2016-01-02 NOTE — ED Triage Notes (Signed)
She  Was found by friends to be on the top floor of [place] parking [place], communication that she "didn't want to live anymore".  Police arrived to find her tearful and reserved, telling them she "would like to talk to someone because I don't want to live anymore".  She arrives here in no distress and continues to express s.i. I assure her I am glad she is here and safe and that we will take very good care of her.  Police remain with her.

## 2016-01-03 ENCOUNTER — Inpatient Hospital Stay (HOSPITAL_COMMUNITY)
Admission: AD | Admit: 2016-01-03 | Discharge: 2016-01-07 | DRG: 885 | Disposition: A | Discharge status: Home / Self Care | Payer: Medicaid Other | Source: Intra-hospital | Attending: Psychiatry | Admitting: Psychiatry

## 2016-01-03 ENCOUNTER — Encounter (HOSPITAL_COMMUNITY): Payer: Self-pay | Admitting: *Deleted

## 2016-01-03 DIAGNOSIS — R45851 Suicidal ideations: Secondary | ICD-10-CM

## 2016-01-03 DIAGNOSIS — F4312 Post-traumatic stress disorder, chronic: Secondary | ICD-10-CM | POA: Diagnosis present

## 2016-01-03 DIAGNOSIS — G8929 Other chronic pain: Secondary | ICD-10-CM | POA: Diagnosis present

## 2016-01-03 DIAGNOSIS — F332 Major depressive disorder, recurrent severe without psychotic features: Secondary | ICD-10-CM

## 2016-01-03 DIAGNOSIS — M549 Dorsalgia, unspecified: Secondary | ICD-10-CM | POA: Diagnosis present

## 2016-01-03 DIAGNOSIS — F4310 Post-traumatic stress disorder, unspecified: Secondary | ICD-10-CM | POA: Diagnosis present

## 2016-01-03 DIAGNOSIS — F329 Major depressive disorder, single episode, unspecified: Secondary | ICD-10-CM | POA: Diagnosis not present

## 2016-01-03 DIAGNOSIS — Z811 Family history of alcohol abuse and dependence: Secondary | ICD-10-CM | POA: Diagnosis not present

## 2016-01-03 MED ORDER — MAGNESIUM HYDROXIDE 400 MG/5ML PO SUSP: 30.0000 mL | Freq: Every day | ORAL | Status: DC | PRN

## 2016-01-03 MED ORDER — FLUOXETINE HCL 20 MG PO CAPS: 20.0000 mg | ORAL_CAPSULE | Freq: Every day | ORAL | Status: DC

## 2016-01-03 MED ORDER — KETOROLAC TROMETHAMINE 60 MG/2ML IM SOLN
60.0000 mg | Freq: Once | INTRAMUSCULAR | Status: AC
  Administered 2016-01-03: 60 mg via INTRAMUSCULAR
  Filled 2016-01-03: qty 2

## 2016-01-03 MED ORDER — FLUOXETINE HCL 20 MG PO CAPS
20.0000 mg | ORAL_CAPSULE | Freq: Every day | ORAL | Status: DC
  Administered 2016-01-04 – 2016-01-07 (×4): 20 mg via ORAL
  Filled 2016-01-03: qty 1
  Filled 2016-01-03: qty 3
  Filled 2016-01-03 (×4): qty 1

## 2016-01-03 MED ORDER — DEXAMETHASONE SODIUM PHOSPHATE 10 MG/ML IJ SOLN
10.0000 mg | Freq: Once | INTRAMUSCULAR | Status: AC
  Administered 2016-01-03: 10 mg via INTRAMUSCULAR
  Filled 2016-01-03: qty 1

## 2016-01-03 MED ORDER — METOCLOPRAMIDE HCL 5 MG/ML IJ SOLN
10.0000 mg | Freq: Once | INTRAMUSCULAR | Status: AC
  Administered 2016-01-03: 10 mg via INTRAMUSCULAR
  Filled 2016-01-03 (×2): qty 2

## 2016-01-03 MED ORDER — KETOROLAC TROMETHAMINE 30 MG/ML IJ SOLN
60.0000 mg | Freq: Once | INTRAMUSCULAR | Status: DC
  Filled 2016-01-03: qty 2

## 2016-01-03 MED ORDER — BUSPIRONE HCL 15 MG PO TABS
7.5000 mg | ORAL_TABLET | Freq: Two times a day (BID) | ORAL | Status: DC
  Administered 2016-01-03: 7.5 mg via ORAL
  Filled 2016-01-03: qty 1

## 2016-01-03 MED ORDER — ALUM & MAG HYDROXIDE-SIMETH 200-200-20 MG/5ML PO SUSP: 30.0000 mL | ORAL | Status: DC | PRN

## 2016-01-03 MED ORDER — IBUPROFEN 200 MG PO TABS
600.0000 mg | ORAL_TABLET | Freq: Three times a day (TID) | ORAL | Status: DC | PRN
  Administered 2016-01-03 (×2): 600 mg via ORAL
  Filled 2016-01-03 (×2): qty 3

## 2016-01-03 MED ORDER — HYDROXYZINE HCL 50 MG PO TABS
50.0000 mg | ORAL_TABLET | Freq: Every evening | ORAL | Status: DC | PRN
Start: 2016-01-03 — End: 2016-01-05
  Administered 2016-01-03 – 2016-01-04 (×2): 50 mg via ORAL
  Filled 2016-01-03 (×2): qty 1

## 2016-01-03 MED ORDER — DULOXETINE HCL 30 MG PO CPEP
30.0000 mg | ORAL_CAPSULE | Freq: Every day | ORAL | Status: DC
  Filled 2016-01-03: qty 1

## 2016-01-03 MED ORDER — ACETAMINOPHEN 325 MG PO TABS
650.0000 mg | ORAL_TABLET | ORAL | Status: DC | PRN
  Administered 2016-01-04 – 2016-01-05 (×2): 650 mg via ORAL
  Filled 2016-01-03 (×2): qty 2

## 2016-01-03 MED ORDER — HYDROXYZINE HCL 25 MG PO TABS: 50.0000 mg | ORAL_TABLET | Freq: Every evening | ORAL | Status: DC | PRN

## 2016-01-03 MED ORDER — BUSPIRONE HCL 15 MG PO TABS
7.5000 mg | ORAL_TABLET | Freq: Two times a day (BID) | ORAL | Status: DC
  Administered 2016-01-04 – 2016-01-06 (×5): 7.5 mg via ORAL
  Filled 2016-01-03 (×7): qty 1

## 2016-01-03 NOTE — ED Notes (Signed)
Report called to Jane at [facility] 

## 2016-01-03 NOTE — ED Notes (Addendum)
Pt continues to c/o HA, reports no relief from Tylenol. Shuvon,NP notified.

## 2016-01-03 NOTE — ED Notes (Signed)
Pt behavior calm and cooperative, pleasant on approach. Pt reports she feels a lot better today. Pt denies SI/HI. Denies AVH. Special checks q 15 mins in place for safety. Video monitoring in place.

## 2016-01-03 NOTE — ED Notes (Signed)
SBAR Report received from previous nurse. Pt received calm and visible on unit. Pt denies current SI/ HI, A/V H, depression, anxiety, and pain 5/10 at this time, and is otherwise stable. Pt reminded of camera surveillance, q 15 min rounds, and rules of the milieu. Will continue to assess.

## 2016-01-03 NOTE — ED Notes (Addendum)
Charted in error.
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
## 2016-01-04 ENCOUNTER — Encounter (HOSPITAL_COMMUNITY): Payer: Self-pay | Admitting: Psychiatry

## 2016-01-04 DIAGNOSIS — G8929 Other chronic pain: Secondary | ICD-10-CM | POA: Diagnosis present

## 2016-01-04 DIAGNOSIS — F4310 Post-traumatic stress disorder, unspecified: Secondary | ICD-10-CM | POA: Diagnosis present

## 2016-01-04 DIAGNOSIS — R45851 Suicidal ideations: Secondary | ICD-10-CM

## 2016-01-04 DIAGNOSIS — M549 Dorsalgia, unspecified: Secondary | ICD-10-CM

## 2016-01-04 MED ORDER — IBUPROFEN 600 MG PO TABS
600.0000 mg | ORAL_TABLET | Freq: Three times a day (TID) | ORAL | Status: DC | PRN
  Administered 2016-01-04 – 2016-01-07 (×7): 600 mg via ORAL
  Filled 2016-01-04 (×7): qty 1

## 2016-01-04 MED ORDER — LIDOCAINE 5 % EX PTCH
1.0000 | MEDICATED_PATCH | Freq: Every day | CUTANEOUS | Status: DC
  Administered 2016-01-04 – 2016-01-07 (×4): 1 via TRANSDERMAL
  Filled 2016-01-04 (×2): qty 1
  Filled 2016-01-04: qty 3
  Filled 2016-01-04 (×3): qty 1

## 2016-01-04 MED ORDER — MIRTAZAPINE 7.5 MG PO TABS
7.5000 mg | ORAL_TABLET | Freq: Every day | ORAL | Status: DC
  Administered 2016-01-04: 7.5 mg via ORAL
  Filled 2016-01-04 (×3): qty 1
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
## 2016-01-04 NOTE — Tx Team (Signed)
Initial Treatment Plan 01/04/2016 1:11 AM Maria Ho [id number]    PATIENT STRESSORS: Financial difficulties Marital or family conflict Medication change or noncompliance   PATIENT STRENGTHS: Average or above average intelligence Capable of independent living General fund of knowledge Motivation for treatment/growth Supportive family/friends   PATIENT IDENTIFIED PROBLEMS: Depression  Anxiety  Risk for self harm  Migraine headache    "Learn coping skills for my PTSD and sadness"  "Find a medication that will work for my anxiety and depression"         DISCHARGE CRITERIA:  Ability to meet basic life and health needs Improved stabilization in mood, thinking, and/or behavior Motivation to continue treatment in a less acute level of care Need for constant or close observation no longer present Verbal commitment to aftercare and medication compliance  PRELIMINARY DISCHARGE PLAN: Attend aftercare/continuing care group Outpatient therapy Return to previous living arrangement Return to previous work or school arrangements  PATIENT/FAMILY INVOLVEMENT: This treatment plan has been presented to and reviewed with the patient, Maria Ho, and/or family member.  The patient and family have been given the opportunity to ask questions and make suggestions.  Ronney Asters, RN 01/04/2016, 1:11 AM

## 2016-01-04 NOTE — Progress Notes (Signed)
Recreation Therapy Notes  Date: 01/04/16 Time: 0930 Location: 300 Hall Group Room  Group Topic: Stress Management  Goal Area(s) Addresses:  Patient will verbalize importance of using healthy stress management.  Patient will identify positive emotions associated with healthy stress management.   Behavioral Response: Engaged  Intervention: Stress Management  Activity :  Guided Imagery.  LRT introduced the stress management technique of guided imagery to the patients.  LRT read a script so patients could participate in the activity.  Patients were to follow along as LRT read script to participate.  Education:  Stress Management, Discharge Planning.   Education Outcome: Acknowledges edcuation/In group clarification offered/Needs additional education  Clinical Observations/Feedback: Pt attended group.   Victorino Sparrow, LRT/CTRS         Victorino Sparrow A 01/04/2016 12:17 PM

## 2016-01-04 NOTE — Progress Notes (Signed)
Vol admit, 35 yo caucasian female, admitted for depression and suicidal ideation after being found on a parking deck crying and contemplating suicide by jumping off the deck. 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  She is also having relationship issues.  Because of her studies, it has been affecting her employment which in turn affects her finances.  Pt states she has four children for which she is responsible.  "I just kept having suicidal thoughts running through my head."  Pt states she has taken psych meds in the past, but is not at this time.  Currently, pt's primary complaint is a lingering migraine that has intensified since her arrival at [facility].  She denies any other medical issues.  She states she smokes marijuana 2-3 times a week and also drinks occasionally.  Pt was cooperative with the admission process.  Paperwork was signed and the search completed.  Pt was oriented to unit/room.  Provider was notified of pt's migraine pain.  It was also noted that pt had a cell phone in her possession at the ED, but did not have a phone with her when she arrived to [facility].  Safety checks q15 minutes were initiated.

## 2016-01-04 NOTE — Progress Notes (Signed)
Patient ID: Maria Ho, female   DOB: 04-09-1981, 35 y.o.   MRN: [id number]  DAR: Pt. Denies SI/HI and A/V Hallucinations. She reports sleep is fair, appetite is good, energy level is normal, and concentration is good. She rats depression 5/10, hopelessness 6/10, and anxiety 6/10. Patient reports acute pain in lower back that she attributes to the bed. Scheduled Lidocaine patch placed on lower back and PRN Ibuprofen administered to patient. Patient also has a care order to use an orthopedic pillow at home to aid in sleep. Support and encouragement provided to the patient. Scheduled medications administered to patient per physician's orders. Patient is minimal but cooperative. She appears depressed and has flat affect at this time. Q15 minute checks are maintained for safety.

## 2016-01-04 NOTE — Progress Notes (Signed)
Adult Psychoeducational Group Note  Date:  01/04/2016 Time:  11:41 AM  Group Topic/Focus:  Goals Group:   The focus of this group is to help patients establish daily goals to achieve during treatment and discuss how the patient can incorporate goal setting into their daily lives to aide in recovery.   Participation Level:  Active  Participation Quality:  Appropriate  Affect:  Appropriate  Cognitive:  Alert and Appropriate  Insight: Appropriate, Good and Improving  Engagement in Group:  Engaged  Modes of Intervention:  Discussion  Additional Comments:  Pt participated in group this morning.  She states that she suffers from anxiety and doesn't know if medication would help because she never stays on it long enough.  Pt states that she is very busy with grad school, kids and her job and she cannot remember to take medication.  Pt goal is to slow down and take better care of herself. Lashelle Koy R Arionna Hoggard 01/04/2016, 11:41 AM
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
## 2016-01-05 LAB — TSH: TSH: 3.013 u[IU]/mL (ref 0.350–4.500)

## 2016-01-05 MED ORDER — TRAZODONE HCL 50 MG PO TABS
50.0000 mg | ORAL_TABLET | Freq: Every evening | ORAL | Status: DC | PRN
  Administered 2016-01-05: 50 mg via ORAL
  Filled 2016-01-05: qty 1

## 2016-01-05 MED ORDER — CYCLOBENZAPRINE HCL 10 MG PO TABS
5.0000 mg | ORAL_TABLET | Freq: Three times a day (TID) | ORAL | Status: DC | PRN
  Administered 2016-01-05 – 2016-01-07 (×4): 5 mg via ORAL
  Filled 2016-01-05 (×4): qty 1

## 2016-01-05 NOTE — Progress Notes (Signed)
D: Pt presents with flat affect and depressed mood. Pt rates depression 3/10. Hopeless 3/10. Anxiety 4/10. Pt denies suicidal thoughts and verbally contracts for safety. Pt reports poor sleep last night due to difficulty falling asleep. Pt c/o back this morning and noon time. Pt requesting to be started on flexeril 10 mg at bedtime or as needed for chronic pain. Dr. Parke Poisson made aware. Pt compliant with attending groups and taking meds. A: Medications reviewed with pt. Medications administered as ordered per MD. Verbal support provided. Pt encouraged to attend groups. 15 minute checks performed for safety.  R: Pt receptive to tx.
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
## 2016-01-05 NOTE — Progress Notes (Signed)
Adult Psychoeducational Group Note  Date:  01/05/2016 Time:  0845 am  Group Topic/Focus:  Recovery Goals:   The focus of this group is to identify appropriate goals for recovery and establish a plan to achieve them.   Participation Level:  Active  Participation Quality:  Appropriate  Affect:  Appropriate  Cognitive:  Appropriate  Insight: Appropriate  Engagement in Group:  Engaged  Modes of Intervention:  Discussion, Education and Orientation  Additional Comments:  Worked on crisis plan. Heydi Swango L 01/05/2016, 3:27 PM

## 2016-01-05 NOTE — [facility] Suicide Risk Assessment (Signed)
[facility] INPATIENT:  Family/Significant Other Suicide Prevention Education  Suicide Prevention Education:  Patient Refusal for Family/Significant Other Suicide Prevention Education: The patient Maria Ho has refused to provide written consent for family/significant other to be provided Family/Significant Other Suicide Prevention Education during admission and/or prior to discharge.  Physician notified.  Bo Mcclintock 01/05/2016, 4:09 PM

## 2016-01-05 NOTE — [facility] Group Notes (Signed)
[facility] LCSW Group Therapy 01/05/2016 1:15 PM  Type of Therapy: Group Therapy- Feelings about Diagnosis  Participation Level: Active   Participation Quality:  Appropriate  Affect:  Appropriate  Cognitive: Alert and Oriented   Insight:  Developing   Engagement in Therapy: Developing/Improving and Engaged   Modes of Intervention: Clarification, Confrontation, Discussion, Education, Exploration, Limit-setting, Orientation, Problem-solving, Rapport Building, Art therapist, Socialization and Support  Description of Group:   This group will allow patients to explore their thoughts and feelings about diagnoses they have received. Patients will be guided to explore their level of understanding and acceptance of these diagnoses. Facilitator will encourage patients to process their thoughts and feelings about the reactions of others to their diagnosis, and will guide patients in identifying ways to discuss their diagnosis with significant others in their lives. This group will be process-oriented, with patients participating in exploration of their own experiences as well as giving and receiving support and challenge from other group members.  Summary of Progress/Problems:  Pt participated in group discussion and identified that her mental health concerns affect her relationships, creating more stress especially when the other person suffers from mental illness as well.   Therapeutic Modalities:   Cognitive Behavioral Therapy Solution Focused Therapy Motivational Interviewing Relapse Prevention Therapy  Peri Maris, [place] 01/05/2016 3:57 PM

## 2016-01-05 NOTE — Tx Team (Signed)
Interdisciplinary Treatment and Diagnostic Plan Update  01/05/2016 Time of Session: 8:55 AM  Maria Ho MRN: [id number]  Principal Diagnosis: PTSD (post-traumatic stress disorder)  Secondary Diagnoses: Principal Problem:   PTSD (post-traumatic stress disorder) Active Problems:   MDD (major depressive disorder), recurrent severe, without psychosis ([place])   Chronic back pain   Current Medications:  Current Facility-Administered Medications  Medication Dose Route Frequency Provider Last Rate Last Dose  . acetaminophen (TYLENOL) tablet 650 mg  650 mg Oral Q4H PRN Shuvon B Rankin, NP   650 mg at 01/04/16 0623  . alum & mag hydroxide-simeth (MAALOX/MYLANTA) 200-200-20 MG/5ML suspension 30 mL  30 mL Oral Q4H PRN Shuvon B Rankin, NP      . busPIRone (BUSPAR) tablet 7.5 mg  7.5 mg Oral BID Shuvon B Rankin, NP   7.5 mg at 01/05/16 0800  . FLUoxetine (PROZAC) capsule 20 mg  20 mg Oral Daily Shuvon B Rankin, NP   20 mg at 01/05/16 0800  . hydrOXYzine (ATARAX/VISTARIL) tablet 50 mg  50 mg Oral QHS PRN Shuvon B Rankin, NP   50 mg at 01/04/16 2310  . ibuprofen (ADVIL,MOTRIN) tablet 600 mg  600 mg Oral Q8H PRN Encarnacion Slates, NP   600 mg at 01/05/16 0815  . ketorolac (TORADOL) 30 MG/ML injection 60 mg  60 mg Intramuscular Once Lurena Nida, NP      . lidocaine (LIDODERM) 5 % 1 patch  1 patch Transdermal Daily Ursula Alert, MD   1 patch at 01/05/16 0801  . magnesium hydroxide (MILK OF MAGNESIA) suspension 30 mL  30 mL Oral Daily PRN Shuvon B Rankin, NP      . mirtazapine (REMERON) tablet 7.5 mg  7.5 mg Oral QHS Saramma Eappen, MD   7.5 mg at 01/04/16 2309    PTA Medications: Prescriptions Prior to Admission  Medication Sig Dispense Refill Last Dose  . Aspirin-Acetaminophen-Caffeine (GOODY HEADACHE PO) Take 1-2 packets by mouth daily as needed (headaches).     . diphenhydrAMINE (BENADRYL) 25 MG tablet Take 25-50 mg by mouth at bedtime as needed for sleep.   Past Week at Unknown time  .  ibuprofen (ADVIL,MOTRIN) 200 MG tablet Take 400 mg by mouth every 6 (six) hours as needed for headache or moderate pain.   12/31/2015  . Melatonin 3 MG TABS Take 3 mg by mouth at bedtime as needed (sleep).   Past Week at Unknown time    Treatment Modalities: Medication Management, Group therapy, Case management,  1 to 1 session with clinician, Psychoeducation, Recreational therapy.   Physician Treatment Plan for Primary Diagnosis: PTSD (post-traumatic stress disorder) Long Term Goal(s): Improvement in symptoms so as ready for discharge  Short Term Goals: Ability to identify changes in lifestyle to reduce recurrence of condition will improve, Ability to verbalize feelings will improve, Ability to demonstrate self-control will improve and Ability to maintain clinical measurements within normal limits will improve  Medication Management: Evaluate patient's response, side effects, and tolerance of medication regimen.  Therapeutic Interventions: 1 to 1 sessions, Unit Group sessions and Medication administration.  Evaluation of Outcomes: Not Met  Physician Treatment Plan for Secondary Diagnosis: Principal Problem:   PTSD (post-traumatic stress disorder) Active Problems:   MDD (major depressive disorder), recurrent severe, without psychosis ([place])   Chronic back pain   Long Term Goal(s): Improvement in symptoms so as ready for discharge  Short Term Goals: Ability to identify changes in lifestyle to reduce recurrence of condition will improve, Ability to disclose and  discuss suicidal ideas, Ability to maintain clinical measurements within normal limits will improve and Compliance with prescribed medications will improve  Medication Management: Evaluate patient's response, side effects, and tolerance of medication regimen.  Therapeutic Interventions: 1 to 1 sessions, Unit Group sessions and Medication administration.  Evaluation of Outcomes: Not Met   RN Treatment Plan for Primary  Diagnosis: PTSD (post-traumatic stress disorder) Long Term Goal(s): Knowledge of disease and therapeutic regimen to maintain health will improve  Short Term Goals: Ability to remain free from injury will improve, Ability to verbalize feelings will improve and Ability to disclose and discuss suicidal ideas  Medication Management: RN will administer medications as ordered by provider, will assess and evaluate patient's response and provide education to patient for prescribed medication. RN will report any adverse and/or side effects to prescribing provider.  Therapeutic Interventions: 1 on 1 counseling sessions, Psychoeducation, Medication administration, Evaluate responses to treatment, Monitor vital signs and CBGs as ordered, Perform/monitor CIWA, COWS, AIMS and Fall Risk screenings as ordered, Perform wound care treatments as ordered.  Evaluation of Outcomes: Not Met   LCSW Treatment Plan for Primary Diagnosis: PTSD (post-traumatic stress disorder) Long Term Goal(s): Safe transition to appropriate next level of care at discharge, Engage patient in therapeutic group addressing interpersonal concerns.  Short Term Goals: Engage patient in aftercare planning with referrals and resources, Increase emotional regulation, Identify triggers associated with mental health/substance abuse issues and Increase skills for wellness and recovery  Therapeutic Interventions: Assess for all discharge needs, 1 to 1 time with Social worker, Explore available resources and support systems, Assess for adequacy in community support network, Educate family and significant other(s) on suicide prevention, Complete Psychosocial Assessment, Interpersonal group therapy.  Evaluation of Outcomes: Not Met   Progress in Treatment: Attending groups: Pt is new to milieu, continuing to assess  Participating in groups: Pt is new to milieu, continuing to assess  Taking medication as prescribed: Yes, MD continues to assess for  medication changes as needed Toleration medication: Yes, no side effects reported at this time Family/Significant other contact made: No, CSW assessing for appropriate contact Patient understands diagnosis: Continuing to assess Discussing patient identified problems/goals with staff: Yes Medical problems stabilized or resolved: Yes Denies suicidal/homicidal ideation: Yes Issues/concerns per patient self-inventory: None Other: N/A  New problem(s) identified: None identified at this time.   New Short Term/Long Term Goal(s): None identified at this time.   Discharge Plan or Barriers: CSW will assess for appropriate discharge plan and relevant barriers.   Reason for Continuation of Hospitalization: Anxiety Depression Medication stabilization Suicidal ideation  Estimated Length of Stay: 3-5 days  Attendees: Patient: 01/05/2016  8:55 AM  Physician: Dr. Parke Poisson 01/05/2016  8:55 AM  Nursing: Eulogio Bear, Micheline Chapman., RN 01/05/2016  8:55 AM  RN Care Manager: Lars Pinks, RN 01/05/2016  8:55 AM  Social Worker: Peri Maris, LCSW; Kristin Drinkard, LCSW 01/05/2016  8:55 AM  Recreational Therapist:  01/05/2016  8:55 AM  Other: Lindell Spar, NP; Samuel Jester, NP 01/05/2016  8:55 AM  Other:  01/05/2016  8:55 AM  Other: 01/05/2016  8:55 AM    Scribe for Treatment Team: Bo Mcclintock, LCSW 01/05/2016 8:55 AM

## 2016-01-05 NOTE — Progress Notes (Signed)
Recreation Therapy Notes  Animal-Assisted Activity (AAA) Program Checklist/Progress Notes Patient Eligibility Criteria Checklist & Daily Group note for Rec TxIntervention  Date: 09.05.2017 Time: 2:45pm Location: [address]    AAA/T Program Assumption of Risk Form signed by Patient/ or Parent Legal Guardian Yes  Patient is free of allergies or sever asthma Yes  Patient reports no fear of animals Yes  Patient reports no history of cruelty to animals Yes  Patient understands his/her participation is voluntary Yes  Behavioral Response: Did not attend.  Laureen Ochs Vivek Grealish, LRT/CTRS  Hattie Pine L 01/05/2016 3:01 PM

## 2016-01-05 NOTE — Progress Notes (Signed)
Adult Psychoeducational Group Note  Date:  01/05/2016 Time:  10:10 PM  Group Topic/Focus:  Wrap-Up Group:   The focus of this group is to help patients review their daily goal of treatment and discuss progress on daily workbooks.   Participation Level:  Active  Participation Quality:  Appropriate  Affect:  Appropriate  Cognitive:  Alert, Appropriate and Oriented  Insight: Appropriate  Engagement in Group:  Engaged  Modes of Intervention:  Discussion  Additional Comments:  Patient attended wrap-up group and said her day was a 9/10.  Her goal was to attend all groups and she did.   Marlyn Tondreau W Camy Leder [phone], 10:10 PM
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
## 2016-01-05 NOTE — Progress Notes (Signed)
Pt reports that she is still feeling anxious, but denies SI/HI/AVH.  Her main focus today is her back pain which she describes as severe.  She reports that her headache seems to be resolved.  She says that she was able to speak with the MD today and is to start mirtazapine tonight for sleep.  She was also ordered a lidocaine patch for her back which she says does not seem to be working.  Pt makes her needs known to staff.  She had visitors tonight and spent most of the evening in the dayroom. Support and encouragement offered.  Discussed today's med changes and what meds pt has available.  Discharge plans are in process and pt intends to return home at discharge. Pt is med compliant on the unit.  Safety maintained with q15 minute checks.

## 2016-01-06 MED ORDER — BUSPIRONE HCL 10 MG PO TABS
10.0000 mg | ORAL_TABLET | Freq: Two times a day (BID) | ORAL | Status: DC
  Administered 2016-01-06 – 2016-01-07 (×2): 10 mg via ORAL
  Filled 2016-01-06 (×2): qty 1
  Filled 2016-01-06: qty 6
  Filled 2016-01-06 (×2): qty 1
  Filled 2016-01-06: qty 6

## 2016-01-06 MED ORDER — TRAZODONE HCL 100 MG PO TABS
100.0000 mg | ORAL_TABLET | Freq: Every evening | ORAL | Status: DC | PRN
  Administered 2016-01-06: 100 mg via ORAL
  Filled 2016-01-06: qty 1
  Filled 2016-01-06: qty 3

## 2016-01-06 MED ORDER — TRAZODONE HCL 50 MG PO TABS
50.0000 mg | ORAL_TABLET | Freq: Every evening | ORAL | Status: DC | PRN
  Administered 2016-01-06: 50 mg via ORAL

## 2016-01-06 NOTE — Progress Notes (Signed)
Adult Psychoeducational Group Note  Date:  01/06/2016 Time:  9:38 PM  Group Topic/Focus:  Wrap-Up Group:   The focus of this group is to help patients review their daily goal of treatment and discuss progress on daily workbooks.   Participation Level:  Active  Participation Quality:  Appropriate  Affect:  Appropriate  Cognitive:  Alert, Appropriate and Oriented  Insight: Appropriate  Engagement in Group:  Engaged  Modes of Intervention:  Discussion  Additional Comments:  Patient said her day was a 9/10.  Her goal for today was to keep good thoughts and she did. Her coping skills were coloring and watching tv.  Renn Stille W Tamico Mundo [phone], 9:38 PM

## 2016-01-06 NOTE — Progress Notes (Signed)
Patient ID: Maria Ho, female   DOB: [birth year], 35 y.o.   MRN: [id number] [facility] MD Progress Note  01/06/2016 1:47 PM Maria Ho  MRN:  [id number] Subjective:  Patient reports she had a difficult day yesterday because her SO decided to move to [place], effectively ending relationship. She states " I am sad about it , of course", but states she realizes that " it may be a good thing for me in the long run" because it will give her more time to focus on herself and her own improvement and on her studies ( she recently started [employer]). She denies any suicidal ideations and states " I would never do that to my children ".  Denies medication side effects at this time . Less focused on back pain today- reports history of chronic back pain .  Objective : I have discussed case with treatment team and have met with patient . Patient is alert , attentive, well related . She is presenting with a somewhat constricted, sad affect , but presents improved overall , and as above, denies any suicidal ideations, and states that relationship break , although painful, will allow her more time for self , children, and her studies . Denies medication side effects- thus far tolerating Prozac and Buspar well . No disruptive or agitated behaviors, going to groups . Visible in day room. Principal Problem: PTSD (post-traumatic stress disorder) Diagnosis:   Patient Active Problem List   Diagnosis Date Noted  . PTSD (post-traumatic stress disorder) [F43.10] 01/04/2016  . Chronic back pain [M54.9, G89.29] 01/04/2016  . MDD (major depressive disorder), recurrent severe, without psychosis ([place]) [F33.2] 01/03/2016  . Chronic post-traumatic stress disorder (PTSD) [F43.12] 01/03/2016  . Numbness [R20.0] 10/30/2014  . Memory disturbance [R41.3] 10/30/2014  . Other fatigue [R53.83] 10/30/2014  . Anxiety [F41.9] 05/06/2013  . Dermatitis, eczematoid [L30.9] 05/06/2013  . Genital herpes [A60.00] 05/06/2013  .  Cannot sleep [G47.00] 05/06/2013  . Oral herpes [B00.2] 05/06/2013  . Palpitations [R00.2] 06/15/2011  . Bleeding in early pregnancy [O20.9] 04/07/2011   Total Time spent with patient: 20 minutes     Past Medical History:  Past Medical History:  Diagnosis Date  . Anxiety    PTSD  . Arrhythmia   . Headache(784.0)    Migraines  . HSV (herpes simplex virus) infection    no outbreak during current pregnancy  . Migraine     Past Surgical History:  Procedure Laterality Date  . NO PAST SURGERIES    . None     Family History:  Family History  Problem Relation Age of Onset  . Post-traumatic stress disorder Father   . Alcohol abuse Father   . Drug abuse Father   . Healthy Mother   . Diabetes Maternal Grandmother   . Cancer Paternal Grandmother     Great-Grandparent  . Cancer Paternal Grandfather     Great-Gradnparent    Social History:  History  Alcohol Use  . Yes    Comment: 1-2 drinks 2-3 x week     History  Drug Use  . Types: Marijuana    Comment: 2-3 x week    Social History   Social History  . Marital status: Single    Spouse name: N/A  . Number of children: 3  . Years of education: N/A   Occupational History  .  [employer]   Social History Main Topics  . Smoking status: Never Smoker  . Smokeless tobacco: Never Used  . Alcohol  use Yes     Comment: 1-2 drinks 2-3 x week  . Drug use:     Types: Marijuana     Comment: 2-3 x week  . Sexual activity: Yes    Birth control/ protection: IUD   Other Topics Concern  . None   Social History Narrative   Lives with three children.   Occasional caffeine.   Additional Social History:    Pain Medications: See home med list Prescriptions: See home med list Over the Counter: See home med list History of alcohol / drug use?: Yes Longest period of sobriety (when/how long): pt denies abuse Negative Consequences of Use: Personal relationships Name of Substance 1: THC 1 - Age of First Use: 18 1 - Amount  (size/oz): half a blunt 1 - Frequency: 1-2 x week 1 - Duration: ongoing 1 - Last Use / Amount: 01/01/16 Name of Substance 2: ETOH 2 - Age of First Use: teens 2 - Amount (size/oz): 1-2 drinks 2 - Frequency: 2-3 x week 2 - Duration: ongoing 2 - Last Use / Amount: unknown  Sleep:  Fair   Appetite:  Good  Current Medications: Current Facility-Administered Medications  Medication Dose Route Frequency Provider Last Rate Last Dose  . acetaminophen (TYLENOL) tablet 650 mg  650 mg Oral Q4H PRN Shuvon B Rankin, NP   650 mg at 01/05/16 1322  . alum & mag hydroxide-simeth (MAALOX/MYLANTA) 200-200-20 MG/5ML suspension 30 mL  30 mL Oral Q4H PRN Shuvon B Rankin, NP      . busPIRone (BUSPAR) tablet 10 mg  10 mg Oral BID Myer Peer Omaree Fuqua, MD      . cyclobenzaprine (FLEXERIL) tablet 5 mg  5 mg Oral TID PRN Jenne Campus, MD   5 mg at 01/06/16 0834  . FLUoxetine (PROZAC) capsule 20 mg  20 mg Oral Daily Shuvon B Rankin, NP   20 mg at 01/06/16 0834  . ibuprofen (ADVIL,MOTRIN) tablet 600 mg  600 mg Oral Q8H PRN Encarnacion Slates, NP   600 mg at 01/06/16 0835  . ketorolac (TORADOL) 30 MG/ML injection 60 mg  60 mg Intramuscular Once Lurena Nida, NP      . lidocaine (LIDODERM) 5 % 1 patch  1 patch Transdermal Daily Ursula Alert, MD   1 patch at 01/06/16 0833  . magnesium hydroxide (MILK OF MAGNESIA) suspension 30 mL  30 mL Oral Daily PRN Shuvon B Rankin, NP      . traZODone (DESYREL) tablet 50 mg  50 mg Oral QHS PRN,MR X 1 Spencer E Simon, PA-C   50 mg at 01/06/16 0018    Lab Results:  Results for orders placed or performed during the hospital encounter of 01/03/16 (from the past 48 hour(s))  TSH     Status: None   Collection Time: 01/05/16  6:04 AM  Result Value Ref Range   TSH 3.013 0.350 - 4.500 uIU/mL    Comment: Performed at [facility]    Blood Alcohol level:  Lab Results  Component Value Date   [facility] <5 [id number]/[id number]    Metabolic Disorder Labs: No results found for: HGBA1C,  MPG No results found for: PROLACTIN No results found for: CHOL, TRIG, HDL, CHOLHDL, VLDL, LDLCALC  Physical Findings: AIMS: Facial and Oral Movements Muscles of Facial Expression: None, normal Lips and Perioral Area: None, normal Jaw: None, normal Tongue: None, normal,Extremity Movements Upper (arms, wrists, hands, fingers): None, normal Lower (legs, knees, ankles, toes): None, normal, Trunk Movements Neck, shoulders, hips:  None, normal, Overall Severity Severity of abnormal movements (highest score from questions above): None, normal Incapacitation due to abnormal movements: None, normal Patient's awareness of abnormal movements (rate only patient's report): No Awareness, Dental Status Current problems with teeth and/or dentures?: No Does patient usually wear dentures?: No  CIWA:    COWS:     Musculoskeletal: Strength & Muscle Tone: within normal limits Gait & Station: normal Patient leans: N/A  Psychiatric Specialty Exam: Physical Exam  ROS denies nausea or vomiting , reports chronic back pain, discomfort   Blood pressure 112/81, pulse 93, temperature 97.9 F (36.6 C), temperature source Oral, resp. rate 16, height _0  (1.854 m), weight 223 lb (101.2 kg), last menstrual period 12/29/2015, SpO2 100 %.Body mass index is 29.42 kg/m.  General Appearance: improved grooming   Eye Contact:  Good  Speech:  Normal Rate  Volume:  Normal  Mood:  reports feeling depressed due to relationship break up but overall states she is feeling better than on admission   Affect:  mildly constricted but reactive  Thought Process:  Linear  Orientation:  Full (Time, Place, and Person)  Thought Content:   No hallucinations, no delusions   Suicidal Thoughts:  No- denies any suicidal ideations, denies any self injurious ideations   Homicidal Thoughts:  No denies any homicidal or violent ideations   Memory:  recent and remote grossly intact   Judgement:  Other:  improving   Insight:  improving    Psychomotor Activity:  Normal  Concentration:  Concentration: Good and Attention Span: Good  Recall:  Good  Fund of Knowledge:  Good  Language:  Good  Akathisia:  Negative  Handed:  Right  AIMS (if indicated):     Assets:  Desire for Improvement Resilience  ADL's:  Intact  Cognition:  WNL  Sleep:  Number of Hours: 4.5   Assessment - patient reports improving mood, although reports feeling saddened by recent relationship break, and finding out that SO has decided to move to [place]. She is able to see this issue in a more positive light, however, and states it will allow her to focus on herself more and on her graduate studies . Denies any suicidal ideations, and identifies love and commitment for her children as a  protective factor from considering suicide  Sleep improved but remains sub-optimal  Treatment Plan Summary: Daily contact with patient to assess and evaluate symptoms and progress in treatment, Medication management, Plan inpatient treatment  and medications as below  Encourage ongoing group , milieu participation to work on coping skills and symptom reduction  Treatment team working on disposition planning  Increase   Trazodone to 100  mgrs QHS PRN for insomnia Increase  Buspar to 10  mgrs BID for anxiety  Continue Prozac 20 mgrs QDAY for depression and anxiety Start Flexeril 5 mgrs TID for muscle spasms, back pain, as needed  Continue Lidoderm patch for back pain as needed     Neita Garnet, MD 01/06/2016, 1:47 PM

## 2016-01-06 NOTE — Progress Notes (Signed)
Patient ID: Maria Ho, female   DOB: 12/26/1980, 35 y.o.   MRN: [id number]  DAR: Pt. Denies SI/HI and A/V Hallucinations. She reports sleep was poor, appetite is good, energy level is normal, and concentration is good. She rates depression 3/10, hopelessness 0/10, and concentration is 5/10. She reports, "I feel more sad than depressed." Patient reports back pain level 7/10. Support and encouragement provided to the patient. Scheduled medications administered to patient per physician's orders. She continues to receive PRN and scheduled medications for reported lower back pain. She reports she is laying in her bed throughout the morning due to her back pain. MD Cobos is aware of patient's back pain. Patient is minimal but cooperative. She reports her goal today is , "to try to be more accepting of life changes." When writer asked how she will meet this goal. She states, "That's a good question. I guess reflection, thinking logical instead of emotion." Q15 minute checks are maintained for safety.

## 2016-01-06 NOTE — Progress Notes (Signed)
Pt reports her day was good.  She had some meds changes regarding her chronic back pain and requests Flexeril at bedtime for back pain relief.  Pt states she is not SI/HI nor having any hallucinations.  She reports that she has been going to groups.  She wants to get back home soon to her children and get back to her studies.  She has also been talking to her SO and says they are going to try to work things out.  Conversation was minimal with patient.  She makes her needs known to staff.  Support and encouragement offered.  Discharge plans are in process.  Safety maintained with q15 minute checks.

## 2016-01-06 NOTE — BHH Group Notes (Signed)
[place] LCSW Group Therapy 01/06/2016 1:15 PM  Type of Therapy: Group Therapy- Emotion Regulation  Participation Level: Reserved  Participation Quality:  Appropriate  Affect: Appropriate  Cognitive: Alert and Oriented   Insight:  Developing/Improving  Engagement in Therapy: Developing/Improving and Engaged   Modes of Intervention: Clarification, Confrontation, Discussion, Education, Exploration, Limit-setting, Orientation, Problem-solving, Rapport Building, Art therapist, Socialization and Support  Summary of Progress/Problems: The topic for group today was emotional regulation. This group focused on both positive and negative emotion identification and allowed group members to process ways to identify feelings, regulate negative emotions, and find healthy ways to manage internal/external emotions. Group members were asked to reflect on a time when their reaction to an emotion led to a negative outcome and explored how alternative responses using emotion regulation would have benefited them. Group members were also asked to discuss a time when emotion regulation was utilized when a negative emotion was experienced. Pt was reserved during group discussion today, however she did identify a warning sign for her anxiety which was choosing to not communicate her emotions rather than express them to her support system.   Peri Maris, [place] 01/06/2016 2:35 PM

## 2016-01-06 NOTE — Progress Notes (Signed)
Recreation Therapy Notes  Date: 01/06/16 Time: 0930 Location: 300 Hall Group Room  Group Topic: Stress Management  Goal Area(s) Addresses:  Patient will verbalize importance of using healthy stress management.  Patient will identify positive emotions associated with healthy stress management.   Intervention: Stress Management  Activity :  Progressive Muscle Relaxation.  LRT introduced the technique of progressive muscle relaxation to patients.  LRT read script so patients to engage in technique.  Patients were to sit in a comfortable position and follow along as LRT read script.  Education:  Stress Management, Discharge Planning.   Education Outcome: Needs additional education  Clinical Observations/Feedback: Pt did not attend group.    Victorino Sparrow, LRT/CTRS         Victorino Sparrow A 01/06/2016 12:27 PM

## 2016-01-06 NOTE — BHH Group Notes (Signed)
Pt attended spiritual care group on grief and loss facilitated by chaplain Kista Robb   Group opened with brief discussion and psycho-social ed around grief and loss in relationships and in relation to self - identifying life patterns, circumstances, changes that cause losses. Established group norm of speaking from own life experience. Group goal of establishing open and affirming space for members to share loss and experience with grief, normalize grief experience and provide psycho social education and grief support.     

## 2016-01-07 MED ORDER — IBUPROFEN 200 MG PO TABS: 400.0000 mg | ORAL_TABLET | Freq: Four times a day (QID) | ORAL | 0 refills | Status: DC | PRN

## 2016-01-07 MED ORDER — BUSPIRONE HCL 10 MG PO TABS: 10.0000 mg | ORAL_TABLET | Freq: Two times a day (BID) | ORAL | 0 refills | Status: DC

## 2016-01-07 MED ORDER — TRAZODONE HCL 100 MG PO TABS: 100.0000 mg | ORAL_TABLET | Freq: Every evening | ORAL | 0 refills | Status: DC | PRN

## 2016-01-07 MED ORDER — FLUOXETINE HCL 20 MG PO CAPS: 20.0000 mg | ORAL_CAPSULE | Freq: Every day | ORAL | 0 refills | Status: DC

## 2016-01-07 MED ORDER — LIDOCAINE 5 % EX PTCH: 1.0000 | MEDICATED_PATCH | Freq: Every day | CUTANEOUS | 0 refills | Status: DC

## 2016-01-07 NOTE — Progress Notes (Signed)
   D: After being asked about her day, the pt asked the writer about her "sleep medication". Pt also complained of lower back pain, stated that the bed "doesn't help" the situation. However, pt stated, "other than that it was a good day".  Pt has no other questions or concerns.    A:  Support and encouragement was offered. 15 min checks continued for safety.  R: Pt remains safe.

## 2016-01-07 NOTE — Progress Notes (Signed)
Pt discharged home with family member. Pt was ambulatory, stable and appreciative at that time. All papers and prescriptions were given and valuables returned. Verbal understanding expressed. Denies SI/HI and A/VH. Pt given opportunity to express concerns and ask questions.

## 2016-01-07 NOTE — Progress Notes (Signed)
  [facility] Adult Case Management Discharge Plan :  Will you be returning to the same living situation after discharge:  Yes,  Pt returning home At discharge, do you have transportation home?: Yes,  Pt family to pick up Do you have the ability to pay for your medications: Yes,  Pt provided with prescriptions and samples  Release of information consent forms completed and in the chart;  Patient's signature needed at discharge.  Patient to Follow up at: Follow-up Information    [facility] .   Why:  9/12 at 1:00pm for therapy with Cristie Hem. 10/13 at 2:20pm for medication management with Dr. Rosine Door. Contact information: [address] [place], [place] [address] [phone] Fax: [phone]          Next level of care provider has access to [place] and Suicide Prevention discussed: Yes,  with Pt; declined family contact  Have you used any form of tobacco in the last 30 days? (Cigarettes, Smokeless Tobacco, Cigars, and/or Pipes): No  Has patient been referred to the Quitline?: N/A patient is not a smoker  Patient has been referred for addiction treatment: Yes  Maria Ho Kandis Cocking 01/07/2016, 1:22 PM

## 2016-01-07 NOTE — BHH Group Notes (Addendum)
Adult Psychoeducational Group Note  Date:  01/07/2016 Time:  0900-0930  Group Topic/Focus:  Nurse education group: Leisure and lifestyle changes.  First patient's identified one gift, one stressor, and one leisure activity.  We discussed the importance of leisure and scheduling leisure activities to provide work/life balance.  Participation Level:  Active  Participation Quality:  Appropriate and Attentive  Affect:  Appropriate  Cognitive:  Alert and Appropriate  Insight: Appropriate  Engagement in Group:  Engaged  Modes of Intervention:  Activity, Clarification, Education and Rapport Building  Additional Comments:  Patient attended for full length of group.  Listed a gift, a stressor, and a leisure activity.

## 2016-01-07 NOTE — Tx Team (Signed)
Interdisciplinary Treatment and Diagnostic Plan Update  01/07/2016 Time of Session: 1:19 PM  Maria Ho MRN: [id number]  Principal Diagnosis: PTSD (post-traumatic stress disorder)  Secondary Diagnoses: Principal Problem:   PTSD (post-traumatic stress disorder) Active Problems:   MDD (major depressive disorder), recurrent severe, without psychosis ([place])   Chronic back pain   Current Medications:  Current Facility-Administered Medications  Medication Dose Route Frequency Provider Last Rate Last Dose  . acetaminophen (TYLENOL) tablet 650 mg  650 mg Oral Q4H PRN Shuvon B Rankin, NP   650 mg at 01/05/16 1322  . alum & mag hydroxide-simeth (MAALOX/MYLANTA) 200-200-20 MG/5ML suspension 30 mL  30 mL Oral Q4H PRN Shuvon B Rankin, NP      . busPIRone (BUSPAR) tablet 10 mg  10 mg Oral BID Jenne Campus, MD   10 mg at 01/07/16 0844  . cyclobenzaprine (FLEXERIL) tablet 5 mg  5 mg Oral TID PRN Jenne Campus, MD   5 mg at 01/07/16 0847  . FLUoxetine (PROZAC) capsule 20 mg  20 mg Oral Daily Shuvon B Rankin, NP   20 mg at 01/07/16 0844  . ibuprofen (ADVIL,MOTRIN) tablet 600 mg  600 mg Oral Q8H PRN Encarnacion Slates, NP   600 mg at 01/07/16 0847  . ketorolac (TORADOL) 30 MG/ML injection 60 mg  60 mg Intramuscular Once Lurena Nida, NP      . lidocaine (LIDODERM) 5 % 1 patch  1 patch Transdermal Daily Ursula Alert, MD   1 patch at 01/07/16 0844  . magnesium hydroxide (MILK OF MAGNESIA) suspension 30 mL  30 mL Oral Daily PRN Shuvon B Rankin, NP      . traZODone (DESYREL) tablet 100 mg  100 mg Oral QHS PRN Jenne Campus, MD   100 mg at 01/06/16 2209    PTA Medications: Prescriptions Prior to Admission  Medication Sig Dispense Refill Last Dose  . Aspirin-Acetaminophen-Caffeine (GOODY HEADACHE PO) Take 1-2 packets by mouth daily as needed (headaches).     . diphenhydrAMINE (BENADRYL) 25 MG tablet Take 25-50 mg by mouth at bedtime as needed for sleep.   Past Week at Unknown time  . Melatonin  3 MG TABS Take 3 mg by mouth at bedtime as needed (sleep).   Past Week at Unknown time  . [DISCONTINUED] ibuprofen (ADVIL,MOTRIN) 200 MG tablet Take 400 mg by mouth every 6 (six) hours as needed for headache or moderate pain.   12/31/2015    Treatment Modalities: Medication Management, Group therapy, Case management,  1 to 1 session with clinician, Psychoeducation, Recreational therapy.   Physician Treatment Plan for Primary Diagnosis: PTSD (post-traumatic stress disorder) Long Term Goal(s): Improvement in symptoms so as ready for discharge  Short Term Goals: Ability to identify changes in lifestyle to reduce recurrence of condition will improve, Ability to verbalize feelings will improve, Ability to demonstrate self-control will improve and Ability to maintain clinical measurements within normal limits will improve  Medication Management: Evaluate patient's response, side effects, and tolerance of medication regimen.  Therapeutic Interventions: 1 to 1 sessions, Unit Group sessions and Medication administration.  Evaluation of Outcomes: Adequate for Discharge  Physician Treatment Plan for Secondary Diagnosis: Principal Problem:   PTSD (post-traumatic stress disorder) Active Problems:   MDD (major depressive disorder), recurrent severe, without psychosis ([place])   Chronic back pain   Long Term Goal(s): Improvement in symptoms so as ready for discharge  Short Term Goals: Ability to identify changes in lifestyle to reduce recurrence of condition will improve,  Ability to disclose and discuss suicidal ideas, Ability to maintain clinical measurements within normal limits will improve and Compliance with prescribed medications will improve  Medication Management: Evaluate patient's response, side effects, and tolerance of medication regimen.  Therapeutic Interventions: 1 to 1 sessions, Unit Group sessions and Medication administration.  Evaluation of Outcomes: Adequate for Discharge   RN  Treatment Plan for Primary Diagnosis: PTSD (post-traumatic stress disorder) Long Term Goal(s): Knowledge of disease and therapeutic regimen to maintain health will improve  Short Term Goals: Ability to remain free from injury will improve, Ability to verbalize feelings will improve and Ability to disclose and discuss suicidal ideas  Medication Management: RN will administer medications as ordered by provider, will assess and evaluate patient's response and provide education to patient for prescribed medication. RN will report any adverse and/or side effects to prescribing provider.  Therapeutic Interventions: 1 on 1 counseling sessions, Psychoeducation, Medication administration, Evaluate responses to treatment, Monitor vital signs and CBGs as ordered, Perform/monitor CIWA, COWS, AIMS and Fall Risk screenings as ordered, Perform wound care treatments as ordered.  Evaluation of Outcomes: Adequate for Discharge   LCSW Treatment Plan for Primary Diagnosis: PTSD (post-traumatic stress disorder) Long Term Goal(s): Safe transition to appropriate next level of care at discharge, Engage patient in therapeutic group addressing interpersonal concerns.  Short Term Goals: Engage patient in aftercare planning with referrals and resources, Increase emotional regulation, Identify triggers associated with mental health/substance abuse issues and Increase skills for wellness and recovery  Therapeutic Interventions: Assess for all discharge needs, 1 to 1 time with Social worker, Explore available resources and support systems, Assess for adequacy in community support network, Educate family and significant other(s) on suicide prevention, Complete Psychosocial Assessment, Interpersonal group therapy.  Evaluation of Outcomes: Adequate for Discharge   Progress in Treatment: Attending groups: Yes Participating in groups: Yes  Taking medication as prescribed: Yes, MD continues to assess for medication changes as  needed Toleration medication: Yes, no side effects reported at this time Family/Significant other contact made: No, Pt declines Patient understands diagnosis: Continuing to assess Discussing patient identified problems/goals with staff: Yes Medical problems stabilized or resolved: Yes Denies suicidal/homicidal ideation: Yes Issues/concerns per patient self-inventory: None Other: N/A  New problem(s) identified: None identified at this time.   New Short Term/Long Term Goal(s): None identified at this time.   Discharge Plan or Barriers: CSW will assess for appropriate discharge plan and relevant barriers.   Reason for Continuation of Hospitalization: Anxiety Depression Medication stabilization Suicidal ideation  Estimated Length of Stay: 0 days  Attendees: Patient: 01/07/2016  1:19 PM  Physician: Dr. Parke Poisson 01/07/2016  1:19 PM  Nursing: Eulogio Bear, Otilio Carpen, RN 01/07/2016  1:19 PM  RN Care Manager: Lars Pinks, RN 01/07/2016  1:19 PM  Social Worker: Peri Maris, LCSW 01/07/2016  1:19 PM  Recreational Therapist:  01/07/2016  1:19 PM  Other: Lindell Spar, NP; Samuel Jester, NP 01/07/2016  1:19 PM  Other:  01/07/2016  1:19 PM  Other: 01/07/2016  1:19 PM    Scribe for Treatment Team: Bo Mcclintock, LCSW 01/07/2016 1:19 PM

## 2016-01-07 NOTE — [facility] Suicide Risk Assessment (Signed)
[facility] Discharge Suicide Risk Assessment   Principal Problem: PTSD (post-traumatic stress disorder) Discharge Diagnoses:  Patient Active Problem List   Diagnosis Date Noted  . PTSD (post-traumatic stress disorder) [F43.10] 01/04/2016  . Chronic back pain [M54.9, G89.29] 01/04/2016  . MDD (major depressive disorder), recurrent severe, without psychosis ([place]) [F33.2] 01/03/2016  . Chronic post-traumatic stress disorder (PTSD) [F43.12] 01/03/2016  . Numbness [R20.0] 10/30/2014  . Memory disturbance [R41.3] 10/30/2014  . Other fatigue [R53.83] 10/30/2014  . Anxiety [F41.9] 05/06/2013  . Dermatitis, eczematoid [L30.9] 05/06/2013  . Genital herpes [A60.00] 05/06/2013  . Cannot sleep [G47.00] 05/06/2013  . Oral herpes [B00.2] 05/06/2013  . Palpitations [R00.2] 06/15/2011  . Bleeding in early pregnancy [O20.9] 04/07/2011    Total Time spent with patient: 30 minutes  Musculoskeletal: Strength & Muscle Tone: within normal limits Gait & Station: normal Patient leans: N/A  Psychiatric Specialty Exam: ROS  Blood pressure (!) 111/53, pulse (!) 122, temperature 98.3 F (36.8 C), temperature source Oral, resp. rate 16, height 6\' 1"  (1.854 m), weight 223 lb (101.2 kg), last menstrual period 12/29/2015, SpO2 100 %.Body mass index is 29.42 kg/m.  General Appearance: improved grooming   Eye Contact::  Good  Speech:  Normal Rate409  Volume:  Normal  Mood:  improved mood, states she is feeling better  Affect:  mildly constricted, but improved compared to admission, smiles at times appropriately   Thought Process:  Linear  Orientation:  Full (Time, Place, and Person)  Thought Content:  no hallucinations, no delusions   Suicidal Thoughts:  No- denies suicidal ideations, denies self injurious ideations   Homicidal Thoughts:  No denies any homicidal ideations   Memory:  recent and remote grossly intact   Judgement:  Other:  improved   Insight:  improved   Psychomotor Activity:  Normal   Concentration:  Good  Recall:  Good  Fund of Knowledge:Good  Language: Good  Akathisia:  Negative  Handed:  Right  AIMS (if indicated):     Assets:  Communication Skills Desire for Improvement Resilience  Sleep:  Number of Hours: 5.5  Cognition: WNL  ADL's:  Intact   Mental Status Per Nursing Assessment::   On Admission:  Self-harm thoughts  Demographic Factors:  35 year old female, 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, Sport and exercise psychologist , on scholarship   Loss Factors: Recent relationship stressors, break-up   Historical Factors: History of depression and of PTSD   Risk Reduction Factors:   Responsible for children under 57 years of age, Sense of responsibility to family, Living with another person, especially a relative, Positive coping skills or problem solving skills and involved in graduate school, highly motivated in ongoing education /career advancement  Continued Clinical Symptoms:  At this time patient is improved compared to admission - presents alert, attentive, mood is improved, affect is appropriate, no thought disorder, no suicidal or homicidal ideations, no hallucinations, no delusions, future oriented , looking forward to returning to grad school as of next week. Denies medication side effects at this time .  Cognitive Features That Contribute To Risk:  No gross cognitive deficits noted upon discharge. Is alert , attentive, and oriented x 3   Suicide Risk:  Mild:  Suicidal ideation of limited frequency, intensity, duration, and specificity.  There are no identifiable plans, no associated intent, mild dysphoria and related symptoms, good self-control (both objective and subjective assessment), few other risk factors, and identifiable protective factors, including available and accessible social support.  Follow-up Information    [facility] .  Why:  9/12 at 1:00pm for therapy with Cristie Hem. 10/13 at 2:20pm for medication management with Dr. Rosine Door. Contact information: [address] [place], [place] [address] [phone] Fax: [phone]          Plan Of Care/Follow-up recommendations:  Activity:  as tolerated  Diet:  Regular  Tests:  NA Other:  see below  Patient is leaving unit in good spirits  Plans to return home  Follow up as above  Neita Garnet, MD 01/07/2016, 1:56 PM
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
## 2016-06-20 ENCOUNTER — Emergency Department (HOSPITAL_COMMUNITY)
Admission: EM | Admit: 2016-06-20 | Discharge: 2016-06-20 | Disposition: A | Discharge status: Home / Self Care | Payer: Medicaid Other | Attending: Emergency Medicine | Admitting: Emergency Medicine

## 2016-06-20 ENCOUNTER — Encounter (HOSPITAL_COMMUNITY): Payer: Self-pay

## 2016-06-20 ENCOUNTER — Emergency Department (HOSPITAL_COMMUNITY): Payer: Medicaid Other

## 2016-06-20 DIAGNOSIS — R079 Chest pain, unspecified: Secondary | ICD-10-CM | POA: Diagnosis not present

## 2016-06-20 DIAGNOSIS — R072 Precordial pain: Secondary | ICD-10-CM | POA: Insufficient documentation

## 2016-06-20 LAB — I-STAT CHEM 8, ED
BUN: 10 mg/dL (ref 6–20)
CALCIUM ION: 1.12 mmol/L — AB (ref 1.15–1.40)
CHLORIDE: 104 mmol/L (ref 101–111)
Creatinine, Ser: 0.6 mg/dL (ref 0.44–1.00)
GLUCOSE: 88 mg/dL (ref 65–99)
HCT: 40 % (ref 36.0–46.0)
Hemoglobin: 13.6 g/dL (ref 12.0–15.0)
Potassium: 3.2 mmol/L — ABNORMAL LOW (ref 3.5–5.1)
Sodium: 143 mmol/L (ref 135–145)
TCO2: 23 mmol/L (ref 0–100)

## 2016-06-20 LAB — D-DIMER, QUANTITATIVE (NOT AT ARMC): D DIMER QUANT: 0.59 ug{FEU}/mL — AB (ref 0.00–0.50)

## 2016-06-20 MED ORDER — IBUPROFEN 800 MG PO TABS: 800.0000 mg | ORAL_TABLET | Freq: Three times a day (TID) | ORAL | 0 refills | Status: DC

## 2016-06-20 MED ORDER — IOPAMIDOL (ISOVUE-370) INJECTION 76%
INTRAVENOUS | Status: AC
  Administered 2016-06-20: 100 mL
  Filled 2016-06-20: qty 100

## 2016-06-20 MED ORDER — KETOROLAC TROMETHAMINE 60 MG/2ML IM SOLN
30.0000 mg | Freq: Once | INTRAMUSCULAR | Status: AC
  Administered 2016-06-20: 30 mg via INTRAMUSCULAR
  Filled 2016-06-20: qty 2

## 2016-06-20 NOTE — ED Notes (Signed)
Mom at bedside.

## 2016-06-20 NOTE — ED Notes (Signed)
Pt did not need anything at this time  
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
## 2016-06-20 NOTE — ED Triage Notes (Signed)
Pt reports she woke up this morning with sharp chest pain. She describes the pain as sharp. Pt denies radiation or any associated symptoms.

## 2016-08-25 ENCOUNTER — Encounter (HOSPITAL_COMMUNITY): Payer: Self-pay | Admitting: Emergency Medicine

## 2016-08-25 ENCOUNTER — Emergency Department (HOSPITAL_COMMUNITY)
Admission: EM | Admit: 2016-08-25 | Discharge: 2016-08-25 | Disposition: A | Discharge status: Home / Self Care | Payer: Medicaid Other | Attending: Emergency Medicine | Admitting: Emergency Medicine

## 2016-08-25 ENCOUNTER — Ambulatory Visit (HOSPITAL_COMMUNITY)
Admission: EM | Admit: 2016-08-25 | Discharge: 2016-08-25 | Disposition: A | Discharge status: Home / Self Care | Payer: No Typology Code available for payment source | Source: Ambulatory Visit | Attending: Emergency Medicine | Admitting: Emergency Medicine

## 2016-08-25 ENCOUNTER — Emergency Department (HOSPITAL_COMMUNITY): Payer: Medicaid Other

## 2016-08-25 DIAGNOSIS — T7421XA Adult sexual abuse, confirmed, initial encounter: Secondary | ICD-10-CM | POA: Insufficient documentation

## 2016-08-25 DIAGNOSIS — S60032A Contusion of left middle finger without damage to nail, initial encounter: Secondary | ICD-10-CM | POA: Insufficient documentation

## 2016-08-25 DIAGNOSIS — Z79899 Other long term (current) drug therapy: Secondary | ICD-10-CM | POA: Insufficient documentation

## 2016-08-25 DIAGNOSIS — Y939 Activity, unspecified: Secondary | ICD-10-CM | POA: Insufficient documentation

## 2016-08-25 DIAGNOSIS — Y929 Unspecified place or not applicable: Secondary | ICD-10-CM | POA: Diagnosis not present

## 2016-08-25 DIAGNOSIS — R51 Headache: Secondary | ICD-10-CM | POA: Insufficient documentation

## 2016-08-25 DIAGNOSIS — Y999 Unspecified external cause status: Secondary | ICD-10-CM | POA: Diagnosis not present

## 2016-08-25 DIAGNOSIS — R519 Headache, unspecified: Secondary | ICD-10-CM

## 2016-08-25 DIAGNOSIS — Z0441 Encounter for examination and observation following alleged adult rape: Secondary | ICD-10-CM | POA: Insufficient documentation

## 2016-08-25 LAB — COMPREHENSIVE METABOLIC PANEL
ALBUMIN: 4.2 g/dL (ref 3.5–5.0)
ALK PHOS: 62 U/L (ref 38–126)
ALT: 28 U/L (ref 14–54)
ANION GAP: 10 (ref 5–15)
AST: 23 U/L (ref 15–41)
BUN: 5 mg/dL — ABNORMAL LOW (ref 6–20)
CALCIUM: 9 mg/dL (ref 8.9–10.3)
CO2: 21 mmol/L — AB (ref 22–32)
CREATININE: 0.64 mg/dL (ref 0.44–1.00)
Chloride: 111 mmol/L (ref 101–111)
GFR calc non Af Amer: >60 mL/min (ref >60)
Glucose, Bld: 117 mg/dL — ABNORMAL HIGH (ref 65–99)
Potassium: 3.6 mmol/L (ref 3.5–5.1)
SODIUM: 142 mmol/L (ref 135–145)
Total Bilirubin: 0.3 mg/dL (ref 0.3–1.2)
Total Protein: 8.1 g/dL (ref 6.5–8.1)

## 2016-08-25 LAB — CBC WITH DIFFERENTIAL/PLATELET
BASOS PCT: 0 %
Basophils Absolute: 0 10*3/uL (ref 0.0–0.1)
EOS ABS: 0.1 10*3/uL (ref 0.0–0.7)
EOS PCT: 1 %
HCT: 42.6 % (ref 36.0–46.0)
HEMOGLOBIN: 14.2 g/dL (ref 12.0–15.0)
Lymphocytes Relative: 14 %
Lymphs Abs: 1.6 10*3/uL (ref 0.7–4.0)
MCH: 26.6 pg (ref 26.0–34.0)
MCHC: 33.3 g/dL (ref 30.0–36.0)
MCV: 79.9 fL (ref 78.0–100.0)
Monocytes Absolute: 0.8 10*3/uL (ref 0.1–1.0)
Monocytes Relative: 7 %
NEUTROS PCT: 78 %
Neutro Abs: 8.8 10*3/uL — ABNORMAL HIGH (ref 1.7–7.7)
PLATELETS: 254 10*3/uL (ref 150–400)
RBC: 5.33 MIL/uL — AB (ref 3.87–5.11)
RDW: 14.9 % (ref 11.5–15.5)
WBC: 11.4 10*3/uL — AB (ref 4.0–10.5)

## 2016-08-25 LAB — HIV ANTIBODY (ROUTINE TESTING W REFLEX): HIV Screen 4th Generation wRfx: NONREACTIVE

## 2016-08-25 LAB — RAPID HIV SCREEN (HIV 1/2 AB+AG)
HIV 1/2 ANTIBODIES: NONREACTIVE
HIV-1 P24 ANTIGEN - HIV24: NONREACTIVE

## 2016-08-25 LAB — POC URINE PREG, ED: PREG TEST UR: NEGATIVE

## 2016-08-25 MED ORDER — AZITHROMYCIN 250 MG PO TABS
1000.0000 mg | ORAL_TABLET | Freq: Once | ORAL | Status: AC
  Administered 2016-08-25: 1000 mg via ORAL
  Filled 2016-08-25: qty 4

## 2016-08-25 MED ORDER — ONDANSETRON 4 MG PO TBDP
4.0000 mg | ORAL_TABLET | Freq: Once | ORAL | Status: AC
  Administered 2016-08-25: 4 mg via ORAL
  Filled 2016-08-25: qty 1

## 2016-08-25 MED ORDER — METRONIDAZOLE 500 MG PO TABS
2000.0000 mg | ORAL_TABLET | Freq: Once | ORAL | Status: DC
  Filled 2016-08-25: qty 4

## 2016-08-25 MED ORDER — KETOROLAC TROMETHAMINE 30 MG/ML IJ SOLN
30.0000 mg | Freq: Once | INTRAMUSCULAR | Status: AC
  Administered 2016-08-25: 30 mg via INTRAVENOUS
  Filled 2016-08-25: qty 1

## 2016-08-25 MED ORDER — PROMETHAZINE HCL 25 MG PO TABS
25.0000 mg | ORAL_TABLET | Freq: Four times a day (QID) | ORAL | Status: DC | PRN
  Administered 2016-08-25: 25 mg via ORAL
  Filled 2016-08-25: qty 1

## 2016-08-25 MED ORDER — METOCLOPRAMIDE HCL 5 MG/ML IJ SOLN
10.0000 mg | INTRAMUSCULAR | Status: AC
  Administered 2016-08-25: 10 mg via INTRAVENOUS
  Filled 2016-08-25: qty 2

## 2016-08-25 MED ORDER — DIPHENHYDRAMINE HCL 50 MG/ML IJ SOLN
12.5000 mg | Freq: Once | INTRAMUSCULAR | Status: AC
  Administered 2016-08-25: 12.5 mg via INTRAVENOUS
  Filled 2016-08-25: qty 1

## 2016-08-25 MED ORDER — CEFTRIAXONE SODIUM 250 MG IJ SOLR
250.0000 mg | Freq: Once | INTRAMUSCULAR | Status: AC
  Administered 2016-08-25: 250 mg via INTRAMUSCULAR
  Filled 2016-08-25: qty 250

## 2016-08-25 MED ORDER — IBUPROFEN 800 MG PO TABS: 800.0000 mg | ORAL_TABLET | Freq: Three times a day (TID) | ORAL | 0 refills | Status: DC

## 2016-08-25 MED ORDER — PROCHLORPERAZINE EDISYLATE 5 MG/ML IJ SOLN
10.0000 mg | Freq: Once | INTRAMUSCULAR | Status: AC
  Administered 2016-08-25: 10 mg via INTRAVENOUS
  Filled 2016-08-25: qty 2

## 2016-08-25 MED ORDER — LIDOCAINE HCL (PF) 1 % IJ SOLN
0.9000 mL | Freq: Once | INTRAMUSCULAR | Status: AC
  Administered 2016-08-25: 0.9 mL
  Filled 2016-08-25: qty 30
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
## 2016-09-09 ENCOUNTER — Ambulatory Visit: Payer: Self-pay

## 2017-04-11 ENCOUNTER — Encounter (HOSPITAL_BASED_OUTPATIENT_CLINIC_OR_DEPARTMENT_OTHER): Payer: Self-pay | Admitting: Emergency Medicine

## 2017-04-11 ENCOUNTER — Emergency Department (HOSPITAL_BASED_OUTPATIENT_CLINIC_OR_DEPARTMENT_OTHER)
Admission: EM | Admit: 2017-04-11 | Discharge: 2017-04-11 | Disposition: A | Discharge status: Home / Self Care | Payer: Medicaid Other | Attending: Emergency Medicine | Admitting: Emergency Medicine

## 2017-04-11 ENCOUNTER — Emergency Department (HOSPITAL_BASED_OUTPATIENT_CLINIC_OR_DEPARTMENT_OTHER): Payer: Medicaid Other

## 2017-04-11 ENCOUNTER — Other Ambulatory Visit: Payer: Self-pay

## 2017-04-11 DIAGNOSIS — R0789 Other chest pain: Secondary | ICD-10-CM | POA: Diagnosis not present

## 2017-04-11 DIAGNOSIS — G8929 Other chronic pain: Secondary | ICD-10-CM | POA: Diagnosis not present

## 2017-04-11 DIAGNOSIS — R05 Cough: Secondary | ICD-10-CM | POA: Insufficient documentation

## 2017-04-11 DIAGNOSIS — Z79899 Other long term (current) drug therapy: Secondary | ICD-10-CM | POA: Insufficient documentation

## 2017-04-11 DIAGNOSIS — Z8679 Personal history of other diseases of the circulatory system: Secondary | ICD-10-CM | POA: Insufficient documentation

## 2017-04-11 DIAGNOSIS — R079 Chest pain, unspecified: Secondary | ICD-10-CM | POA: Diagnosis present

## 2017-04-11 MED ORDER — INDOMETHACIN 50 MG PO CAPS: 50.0000 mg | ORAL_CAPSULE | Freq: Two times a day (BID) | ORAL | 0 refills | Status: DC

## 2017-04-11 MED FILL — INDOMETHACIN 50 MG CAPSULE: 50 | 10 days supply | Qty: 20 | Fill #0

## 2017-04-11 NOTE — ED Triage Notes (Signed)
Pt having bilateral rib pain for two days.  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  Pt c/o pain when lying on either side.  Difficulty breathing when on stomach.  Some cold symptoms prior to rib pain.  No fever.  Pt feels like she cannot take a deep breath.

## 2017-04-11 NOTE — ED Provider Notes (Signed)
[facility] EMERGENCY DEPARTMENT Provider Note   CSN: [id number] Arrival date & time: 04/11/17  1203     History   Chief Complaint Chief Complaint  Patient presents with  . Chest Pain    bilateral    HPI Maria Ho is a 36 y.o. female.  Patient reports two days of left sided rib pain under left breast. Increased pain with deep inspiration and palpation. Mild persistent cough. No falls, chest wall trauma, or skin rash.   The history is provided by the patient. No language interpreter was used.  Chest Pain   This is a new problem. The current episode started 2 days ago. The problem has not changed since onset.The pain is associated with movement, coughing and breathing. The pain is present in the lateral region. The pain is moderate. The quality of the pain is described as sharp. The symptoms are aggravated by certain positions and deep breathing. Associated symptoms include cough. Pertinent negatives include no abdominal pain, no back pain, no exertional chest pressure, no fever, no nausea and no vomiting.    Past Medical History:  Diagnosis Date  . Anxiety    PTSD  . Arrhythmia   . Headache(784.0)    Migraines  . HSV (herpes simplex virus) infection    no outbreak during current pregnancy  . Migraine     Patient Active Problem List   Diagnosis Date Noted  . PTSD (post-traumatic stress disorder) 01/04/2016  . Chronic back pain 01/04/2016  . MDD (major depressive disorder), recurrent severe, without psychosis ([facility]) 01/03/2016  . Chronic post-traumatic stress disorder (PTSD) 01/03/2016  . Numbness 10/30/2014  . Memory disturbance 10/30/2014  . Other fatigue 10/30/2014  . Anxiety 05/06/2013  . Dermatitis, eczematoid 05/06/2013  . Genital herpes 05/06/2013  . Cannot sleep 05/06/2013  . Oral herpes 05/06/2013  . Palpitations 06/15/2011  . Bleeding in early pregnancy 04/07/2011    Past Surgical History:  Procedure Laterality Date  . NO PAST  SURGERIES    . None      OB History    Gravida Para Term Preterm AB Living   14 4 4   10 4    SAB TAB Ectopic Multiple Live Births   1 9     4        Home Medications    Prior to Admission medications   Medication Sig Start Date End Date Taking? Authorizing Provider  ALPRAZolam Duanne Moron) 1 MG tablet Take 1 mg by mouth at bedtime as needed for anxiety.   Yes [provider]  clonazePAM (KLONOPIN) 0.5 MG tablet Take 0.5 mg by mouth 2 (two) times daily as needed for anxiety.   Yes [provider]  desvenlafaxine (PRISTIQ) 100 MG 24 hr tablet Take 100 mg by mouth daily.   Yes [provider]  lamoTRIgine (LAMICTAL) 100 MG tablet Take 100 mg by mouth daily.   Yes [provider]  ibuprofen (ADVIL,MOTRIN) 800 MG tablet Take 1 tablet (800 mg total) by mouth 3 (three) times daily. 08/25/16   Antonietta Breach, PA-C  traZODone (DESYREL) 100 MG tablet Take 1 tablet (100 mg total) by mouth at bedtime as needed for sleep. 01/07/16   Encarnacion Slates, NP    Family History Family History  Problem Relation Age of Onset  . Post-traumatic stress disorder Father   . Alcohol abuse Father   . Drug abuse Father   . Healthy Mother   . Diabetes Maternal Grandmother   . Cancer Paternal Grandmother  Great-Grandparent  . Cancer Paternal Grandfather        Great-Gradnparent    Social History Social History   Tobacco Use  . Smoking status: Never Smoker  . Smokeless tobacco: Never Used  Substance Use Topics  . Alcohol use: Yes    Comment: 1-2 drinks 2-3 x week  . Drug use: Yes    Types: Marijuana    Comment: 2-3 x week     Allergies   Patient has no known allergies.   Review of Systems Review of Systems  Constitutional: Negative for fever.  Respiratory: Positive for cough.   Cardiovascular: Positive for chest pain.  Gastrointestinal: Negative for abdominal pain, nausea and vomiting.  Musculoskeletal: Negative for back pain.  All other systems reviewed  and are negative.    Physical Exam Updated Vital Signs BP (!) 134/101 (BP Location: Right Arm)   Pulse 74   Temp 98.8 F (37.1 C) (Oral)   Resp 18   Ht 5\' 9"  (1.753 m)   Wt 86.2 kg (190 lb)   LMP 04/04/2017   SpO2 100%   BMI 28.06 kg/m   Physical Exam  Constitutional: She is oriented to person, place, and time. She appears well-developed and well-nourished. No distress.  HENT:  Head: Atraumatic.  Eyes: Conjunctivae are normal.  Neck: Neck supple.  Cardiovascular: Normal rate, regular rhythm and intact distal pulses.  Pulmonary/Chest: Effort normal and breath sounds normal. No respiratory distress.  Abdominal: Soft. Bowel sounds are normal.  Musculoskeletal: Normal range of motion.  Neurological: She is alert and oriented to person, place, and time.  Skin: Skin is warm and dry. No rash noted.  Psychiatric: She has a normal mood and affect.  Nursing note and vitals reviewed.    ED Treatments / Results  Labs (all labs ordered are listed, but only abnormal results are displayed) Labs Reviewed - No data to display  EKG  EKG Interpretation None       Radiology Dg Chest 2 View  Result Date: 04/11/2017 CLINICAL DATA:  Left-sided chest pain. EXAM: CHEST  2 VIEW COMPARISON:  CT 06/20/2016.  Chest x-ray 06/20/2016. FINDINGS: Mediastinum and hilar structures are normal. Lungs are clear. No pleural effusion or pneumothorax. Heart size normal. Thoracic spine scoliosis. IMPRESSION: No acute cardiopulmonary disease. Electronically Signed   By: Marcello Moores  Register   On: 04/11/2017 12:43    Procedures Procedures (including critical care time)  Medications Ordered in ED Medications - No data to display   Initial Impression / Assessment and Plan / ED Course  I have reviewed the triage vital signs and the nursing notes.  Pertinent labs & imaging results that were available during my care of the patient were reviewed by me and considered in my medical decision making (see chart  for details).     Patient with left lateral chest wall pain. Normal chest xray. No skin rash. No recent fall or chest trauma. Presentation consistent with costochondritis. Will trial indocin. Care instructions and return precautions discussed.  Final Clinical Impressions(s) / ED Diagnoses   Final diagnoses:  Left-sided chest wall pain    ED Discharge Orders        Ordered    indomethacin (INDOCIN) 50 MG capsule  2 times daily with meals     04/11/17 1622       Etta Quill, NP 04/11/17 [place], MD 04/12/17 [id number]

## 2017-07-27 ENCOUNTER — Other Ambulatory Visit: Payer: Self-pay | Admitting: Physician Assistant

## 2017-07-27 DIAGNOSIS — N6012 Diffuse cystic mastopathy of left breast: Principal | ICD-10-CM

## 2017-07-27 DIAGNOSIS — N6011 Diffuse cystic mastopathy of right breast: Secondary | ICD-10-CM

## 2017-09-15 ENCOUNTER — Other Ambulatory Visit: Payer: Self-pay

## 2017-09-15 ENCOUNTER — Ambulatory Visit
Admission: RE | Admit: 2017-09-15 | Discharge: 2017-09-15 | Disposition: A | Discharge status: Home / Self Care | Payer: Medicaid Other | Source: Ambulatory Visit | Attending: Physician Assistant | Admitting: Physician Assistant

## 2017-09-15 DIAGNOSIS — N6011 Diffuse cystic mastopathy of right breast: Secondary | ICD-10-CM

## 2017-09-15 DIAGNOSIS — N6012 Diffuse cystic mastopathy of left breast: Principal | ICD-10-CM

## 2018-02-19 ENCOUNTER — Encounter (HOSPITAL_COMMUNITY): Payer: Self-pay | Admitting: Internal Medicine

## 2018-02-19 ENCOUNTER — Emergency Department (HOSPITAL_COMMUNITY)
Admission: EM | Admit: 2018-02-19 | Discharge: 2018-02-19 | Disposition: A | Discharge status: Home / Self Care | Payer: Medicaid Other | Attending: Emergency Medicine | Admitting: Emergency Medicine

## 2018-02-19 DIAGNOSIS — Z79899 Other long term (current) drug therapy: Secondary | ICD-10-CM | POA: Diagnosis not present

## 2018-02-19 DIAGNOSIS — R55 Syncope and collapse: Secondary | ICD-10-CM | POA: Diagnosis present

## 2018-02-19 DIAGNOSIS — R5383 Other fatigue: Secondary | ICD-10-CM | POA: Diagnosis not present

## 2018-02-19 LAB — BASIC METABOLIC PANEL
ANION GAP: 8 (ref 5–15)
BUN: 11 mg/dL (ref 6–20)
CALCIUM: 9.2 mg/dL (ref 8.9–10.3)
CO2: 21 mmol/L — ABNORMAL LOW (ref 22–32)
CREATININE: 0.75 mg/dL (ref 0.44–1.00)
Chloride: 110 mmol/L (ref 98–111)
GLUCOSE: 85 mg/dL (ref 70–99)
Potassium: 3.3 mmol/L — ABNORMAL LOW (ref 3.5–5.1)
Sodium: 139 mmol/L (ref 135–145)

## 2018-02-19 LAB — URINALYSIS, ROUTINE W REFLEX MICROSCOPIC
Bacteria, UA: NONE SEEN
Bilirubin Urine: NEGATIVE
GLUCOSE, UA: NEGATIVE mg/dL
Ketones, ur: 20 mg/dL — AB
Leukocytes, UA: NEGATIVE
Nitrite: NEGATIVE
PH: 6 (ref 5.0–8.0)
Protein, ur: NEGATIVE mg/dL
Specific Gravity, Urine: 1.028 (ref 1.005–1.030)

## 2018-02-19 LAB — CBC
HCT: 46.6 % — ABNORMAL HIGH (ref 36.0–46.0)
Hemoglobin: 14.5 g/dL (ref 12.0–15.0)
MCH: 25.8 pg — AB (ref 26.0–34.0)
MCHC: 31.1 g/dL (ref 30.0–36.0)
MCV: 83.1 fL (ref 80.0–100.0)
PLATELETS: 222 10*3/uL (ref 150–400)
RBC: 5.61 MIL/uL — AB (ref 3.87–5.11)
RDW: 13.8 % (ref 11.5–15.5)
WBC: 7.9 10*3/uL (ref 4.0–10.5)
nRBC: 0 % (ref 0.0–0.2)

## 2018-02-19 LAB — RAPID URINE DRUG SCREEN, HOSP PERFORMED
AMPHETAMINES: POSITIVE — AB
BARBITURATES: NOT DETECTED
Benzodiazepines: POSITIVE — AB
Cocaine: NOT DETECTED
Opiates: NOT DETECTED
TETRAHYDROCANNABINOL: POSITIVE — AB

## 2018-02-19 LAB — I-STAT BETA HCG BLOOD, ED (MC, WL, AP ONLY): I-stat hCG, quantitative: <5 m[IU]/mL (ref <5)

## 2018-02-19 LAB — TSH: TSH: 0.902 u[IU]/mL (ref 0.350–4.500)

## 2018-02-19 LAB — CBG MONITORING, ED: Glucose-Capillary: 84 mg/dL (ref 70–99)

## 2018-02-19 NOTE — ED Provider Notes (Signed)
[facility] EMERGENCY DEPARTMENT Provider Note   CSN: [id number] Arrival date & time: 02/19/18  1818     History   Chief Complaint Chief Complaint  Patient presents with  . Loss of Consciousness    HPI Maria Ho is a 37 y.o. female.  HPI  Maria Ho is a 37yo female with a history of PTSD, depression and anxiety, palpitations who presents to the emergency department via EMS for evaluation after being found at her office desk "passed out."  Patient states that she is chronically tired, works 80 hours a week and gets about 4 to 5 hours of sleep each night.  She is a Librarian, academic at [employer] and remembers walking into work after running some errands to the bank and picking up food for a Mudlogger.  States that she must of passed out or fallen asleep because the next thing that she remembers is and EMS worker waking her up.  She states that she feels baseline, just tired. No different from how she has been feeling the past few months. No report of shaking. Denies hitting her head. She denies loss of continence or tongue biting. No history of seizures in the past.  Denies any alcohol use.  She does take Xanax as needed for anxiety and occasionally uses marijuana. She denies visual disturbance, numbness, weakness, headache, fever, chills, cough, congestion, sore throat, diarrhea, chest pain, shortness of breath, palpitations, leg swelling, lightheadedness, dizziness, abdominal pain.   Past Medical History:  Diagnosis Date  . Anxiety    PTSD  . Arrhythmia   . Headache(784.0)    Migraines  . HSV (herpes simplex virus) infection    no outbreak during current pregnancy  . Migraine     Patient Active Problem List   Diagnosis Date Noted  . PTSD (post-traumatic stress disorder) 01/04/2016  . Chronic back pain 01/04/2016  . MDD (major depressive disorder), recurrent severe, without psychosis ([place]) 01/03/2016  . Chronic post-traumatic stress disorder (PTSD)  01/03/2016  . Numbness 10/30/2014  . Memory disturbance 10/30/2014  . Other fatigue 10/30/2014  . Anxiety 05/06/2013  . Dermatitis, eczematoid 05/06/2013  . Genital herpes 05/06/2013  . Cannot sleep 05/06/2013  . Oral herpes 05/06/2013  . Palpitations 06/15/2011  . Bleeding in early pregnancy 04/07/2011    Past Surgical History:  Procedure Laterality Date  . NO PAST SURGERIES    . None       OB History    Gravida  14   Para  4   Term  4   Preterm      AB  10   Living  4     SAB  1   TAB  9   Ectopic      Multiple      Live Births  4            Home Medications    Prior to Admission medications   Medication Sig Start Date End Date Taking? Authorizing Provider  ALPRAZolam Duanne Moron) 1 MG tablet Take 1 mg by mouth at bedtime as needed for anxiety.    [provider]  clonazePAM (KLONOPIN) 0.5 MG tablet Take 0.5 mg by mouth 2 (two) times daily as needed for anxiety.    [provider]  desvenlafaxine (PRISTIQ) 100 MG 24 hr tablet Take 100 mg by mouth daily.    [provider]  ibuprofen (ADVIL,MOTRIN) 800 MG tablet Take 1 tablet (800 mg total) by mouth 3 (three) times daily. 08/25/16  Antonietta Breach, PA-C  indomethacin (INDOCIN) 50 MG capsule Take 1 capsule (50 mg total) by mouth 2 (two) times daily with a meal. 04/11/17   Etta Quill, NP  lamoTRIgine (LAMICTAL) 100 MG tablet Take 100 mg by mouth daily.    [provider]  traZODone (DESYREL) 100 MG tablet Take 1 tablet (100 mg total) by mouth at bedtime as needed for sleep. 01/07/16   Encarnacion Slates, NP    Family History Family History  Problem Relation Age of Onset  . Post-traumatic stress disorder Father   . Alcohol abuse Father   . Drug abuse Father   . Healthy Mother   . Diabetes Maternal Grandmother   . Cancer Paternal Grandmother        Great-Grandparent  . Cancer Paternal Grandfather        Great-Gradnparent    Social History Social History   Tobacco Use    . Smoking status: Never Smoker  . Smokeless tobacco: Never Used  Substance Use Topics  . Alcohol use: Yes    Comment: 1-2 drinks 2-3 x week  . Drug use: Yes    Types: Marijuana    Comment: 2-3 x week     Allergies   Patient has no known allergies.   Review of Systems Review of Systems  Constitutional: Positive for fatigue. Negative for chills and fever.  HENT: Negative for congestion and sore throat.   Eyes: Negative for visual disturbance.  Respiratory: Negative for cough and shortness of breath.   Cardiovascular: Negative for chest pain, palpitations and leg swelling.  Gastrointestinal: Negative for abdominal pain, diarrhea, nausea and vomiting.  Genitourinary: Negative for difficulty urinating.  Musculoskeletal: Negative for gait problem.  Skin: Negative for rash.  Neurological: Negative for dizziness, weakness, light-headedness and numbness.  Psychiatric/Behavioral: Negative for agitation.     Physical Exam Updated Vital Signs BP (!) 144/88   Pulse 74   Temp (!) 97.4 F (36.3 C)   Resp 16   Ht 5\' 9"  (1.753 m)   SpO2 100%   BMI 28.06 kg/m   Physical Exam  Constitutional: She is oriented to person, place, and time. She appears well-developed and well-nourished. No distress.  No acute distress, non-toxic appearing.   HENT:  Head: Normocephalic and atraumatic.  Mouth/Throat: Oropharynx is clear and moist.  Eyes: Pupils are equal, round, and reactive to light. Conjunctivae and EOM are normal. Right eye exhibits no discharge. Left eye exhibits no discharge.  Neck: Normal range of motion. Neck supple.  Cardiovascular: Normal rate, regular rhythm and intact distal pulses.  Pulmonary/Chest: Effort normal and breath sounds normal. No stridor. No respiratory distress. She has no wheezes. She has no rales.  Abdominal: Soft. Bowel sounds are normal. There is no tenderness.  Musculoskeletal: Normal range of motion.  No leg swelling or calf tenderness.   Neurological:  She is alert and oriented to person, place, and time. Coordination normal.  Mental Status:  Alert, oriented, thought content appropriate, able to give a coherent history. Speech fluent without evidence of aphasia. Able to follow 2 step commands without difficulty.  Cranial Nerves:  II:  Peripheral visual fields grossly normal, pupils equal, round, reactive to light III,IV, VI: ptosis not present, extra-ocular motions intact bilaterally  V,VII: smile symmetric, facial light touch sensation equal VIII: hearing grossly normal to voice  X: uvula elevates symmetrically  XI: bilateral shoulder shrug symmetric and strong XII: midline tongue extension without fassiculations Motor:  Normal tone. 5/5 in upper and lower extremities bilaterally  including strong and equal grip strength and dorsiflexion/plantar flexion Sensory: Sensation to light touch normal in all extremities.  Cerebellar: normal finger-to-nose with bilateral upper extremities Gait: normal gait and balance CV: distal pulses palpable throughout   Skin: Skin is warm and dry. She is not diaphoretic.  Psychiatric: She has a normal mood and affect. Her behavior is normal.  Nursing note and vitals reviewed.    ED Treatments / Results  Labs (all labs ordered are listed, but only abnormal results are displayed) Labs Reviewed  BASIC METABOLIC PANEL - Abnormal; Notable for the following components:      Result Value   Potassium 3.3 (*)    CO2 21 (*)    All other components within normal limits  CBC - Abnormal; Notable for the following components:   RBC 5.61 (*)    HCT 46.6 (*)    MCH 25.8 (*)    All other components within normal limits  URINALYSIS, ROUTINE W REFLEX MICROSCOPIC - Abnormal; Notable for the following components:   Hgb urine dipstick MODERATE (*)    Ketones, ur 20 (*)    All other components within normal limits  RAPID URINE DRUG SCREEN, HOSP PERFORMED - Abnormal; Notable for the following components:    Benzodiazepines POSITIVE (*)    Amphetamines POSITIVE (*)    Tetrahydrocannabinol POSITIVE (*)    All other components within normal limits  TSH  CBG MONITORING, ED  I-STAT BETA HCG BLOOD, ED ([facility], [facility], [facility] ONLY)    EKG EKG Interpretation  Date/Time:  Monday February 19 2018 18:35:06 EDT Ventricular Rate:  77 PR Interval:    QRS Duration: 79 QT Interval:  393 QTC Calculation: 445 R Axis:   48 Text Interpretation:  Sinus rhythm Confirmed by Fredia Sorrow [phone]) on 02/19/2018 6:50:48 PM   Radiology No results found.  Procedures Procedures (including critical care time)  Medications Ordered in ED Medications - No data to display   Initial Impression / Assessment and Plan / ED Course  I have reviewed the triage vital signs and the nursing notes.  Pertinent labs & imaging results that were available during my care of the patient were reviewed by me and considered in my medical decision making (see chart for details).     Presents with difficulty waking today. No tongue biting, loss of consciousness or report of seizure like activity and I doubt new seizure. It also doesn't sound like syncope as patient was found leaning forward on the desk and was woken by EMS. No lightheadedness or dizziness. On exam vital signs stable and she has a normal neurological exam, no concern for acute stroke. EKG without acute arrhythmia or abnormality. CBC unremarkable, normal hemoglobin and no leukocytosis. CBG normal.  BMP without any major electrolyte abnormalities. Rapid UDS positive for benzos, THC and amphetamines. On further questioning patient states she takes adderall. Beta Hcg negative. TSH normal. Feel that patients symptoms are likely related to loss of sleep and generalized fatigue. Prior to discharge, nursing staff informed me that patient left AMA. She left before I was able to recheck her.   Final Clinical Impressions(s) / ED Diagnoses   Final diagnoses:  None    ED Discharge  Orders    None       Glyn Ade, PA-C 02/20/18 [id number]    Fredia Sorrow, MD 02/20/18 [phone]

## 2018-02-19 NOTE — ED Notes (Signed)
Pt reports feeling tired and dizzy.

## 2018-02-19 NOTE — ED Notes (Signed)
Pt states "I feel like every muscle and every fiber in my body feels dead."

## 2018-02-19 NOTE — ED Notes (Addendum)
Pt just come out of her room and stated she is leaving because she has been waiting too long. Pt does not care about her results or anything else at this time. Pt states she is leaving against medical advice.

## 2018-02-19 NOTE — ED Triage Notes (Addendum)
Pt here after passing out at work this afternoon. Coworkers at [employer] found patient in her office sitting at the desk passed out. Endorses blurred vision and dizziness. Denies hitting her head. No seizure like activity per EMS. Denies cp, sob, nausea/vomiting. Reports working 80-100 hours per week and states that she has not been feeling well for "forever." A/Ox4 at this time.

## 2018-06-28 DIAGNOSIS — F4310 Post-traumatic stress disorder, unspecified: Secondary | ICD-10-CM | POA: Diagnosis not present

## 2018-06-28 DIAGNOSIS — F332 Major depressive disorder, recurrent severe without psychotic features: Secondary | ICD-10-CM | POA: Diagnosis not present

## 2018-06-28 DIAGNOSIS — F411 Generalized anxiety disorder: Secondary | ICD-10-CM | POA: Diagnosis not present

## 2018-07-11 DIAGNOSIS — F3181 Bipolar II disorder: Secondary | ICD-10-CM | POA: Diagnosis not present

## 2018-07-11 DIAGNOSIS — F411 Generalized anxiety disorder: Secondary | ICD-10-CM | POA: Diagnosis not present

## 2018-07-11 DIAGNOSIS — F902 Attention-deficit hyperactivity disorder, combined type: Secondary | ICD-10-CM | POA: Diagnosis not present

## 2018-07-11 DIAGNOSIS — F4312 Post-traumatic stress disorder, chronic: Secondary | ICD-10-CM | POA: Diagnosis not present

## 2018-08-14 DIAGNOSIS — F4312 Post-traumatic stress disorder, chronic: Secondary | ICD-10-CM | POA: Diagnosis not present

## 2018-08-14 DIAGNOSIS — F3181 Bipolar II disorder: Secondary | ICD-10-CM | POA: Diagnosis not present

## 2018-08-14 DIAGNOSIS — F4310 Post-traumatic stress disorder, unspecified: Secondary | ICD-10-CM | POA: Diagnosis not present

## 2018-08-14 DIAGNOSIS — F332 Major depressive disorder, recurrent severe without psychotic features: Secondary | ICD-10-CM | POA: Diagnosis not present

## 2018-08-14 DIAGNOSIS — F902 Attention-deficit hyperactivity disorder, combined type: Secondary | ICD-10-CM | POA: Diagnosis not present

## 2018-08-14 DIAGNOSIS — F411 Generalized anxiety disorder: Secondary | ICD-10-CM | POA: Diagnosis not present

## 2018-08-27 DIAGNOSIS — F4310 Post-traumatic stress disorder, unspecified: Secondary | ICD-10-CM | POA: Diagnosis not present

## 2018-08-27 DIAGNOSIS — F332 Major depressive disorder, recurrent severe without psychotic features: Secondary | ICD-10-CM | POA: Diagnosis not present

## 2018-08-27 DIAGNOSIS — F411 Generalized anxiety disorder: Secondary | ICD-10-CM | POA: Diagnosis not present

## 2018-09-05 DIAGNOSIS — F4310 Post-traumatic stress disorder, unspecified: Secondary | ICD-10-CM | POA: Diagnosis not present

## 2018-09-05 DIAGNOSIS — F411 Generalized anxiety disorder: Secondary | ICD-10-CM | POA: Diagnosis not present

## 2018-09-05 DIAGNOSIS — F332 Major depressive disorder, recurrent severe without psychotic features: Secondary | ICD-10-CM | POA: Diagnosis not present

## 2018-09-12 DIAGNOSIS — F4310 Post-traumatic stress disorder, unspecified: Secondary | ICD-10-CM | POA: Diagnosis not present

## 2018-09-12 DIAGNOSIS — F411 Generalized anxiety disorder: Secondary | ICD-10-CM | POA: Diagnosis not present

## 2018-09-12 DIAGNOSIS — F332 Major depressive disorder, recurrent severe without psychotic features: Secondary | ICD-10-CM | POA: Diagnosis not present

## 2018-09-13 DIAGNOSIS — F4312 Post-traumatic stress disorder, chronic: Secondary | ICD-10-CM | POA: Diagnosis not present

## 2018-09-13 DIAGNOSIS — F902 Attention-deficit hyperactivity disorder, combined type: Secondary | ICD-10-CM | POA: Diagnosis not present

## 2018-09-13 DIAGNOSIS — F3181 Bipolar II disorder: Secondary | ICD-10-CM | POA: Diagnosis not present

## 2018-09-13 DIAGNOSIS — F411 Generalized anxiety disorder: Secondary | ICD-10-CM | POA: Diagnosis not present

## 2018-10-03 DIAGNOSIS — F332 Major depressive disorder, recurrent severe without psychotic features: Secondary | ICD-10-CM | POA: Diagnosis not present

## 2018-10-03 DIAGNOSIS — F4310 Post-traumatic stress disorder, unspecified: Secondary | ICD-10-CM | POA: Diagnosis not present

## 2018-10-03 DIAGNOSIS — F411 Generalized anxiety disorder: Secondary | ICD-10-CM | POA: Diagnosis not present

## 2018-10-10 DIAGNOSIS — F4310 Post-traumatic stress disorder, unspecified: Secondary | ICD-10-CM | POA: Diagnosis not present

## 2018-10-10 DIAGNOSIS — F411 Generalized anxiety disorder: Secondary | ICD-10-CM | POA: Diagnosis not present

## 2018-10-10 DIAGNOSIS — F332 Major depressive disorder, recurrent severe without psychotic features: Secondary | ICD-10-CM | POA: Diagnosis not present

## 2018-10-24 DIAGNOSIS — F411 Generalized anxiety disorder: Secondary | ICD-10-CM | POA: Diagnosis not present

## 2018-10-24 DIAGNOSIS — F4310 Post-traumatic stress disorder, unspecified: Secondary | ICD-10-CM | POA: Diagnosis not present

## 2018-10-24 DIAGNOSIS — F332 Major depressive disorder, recurrent severe without psychotic features: Secondary | ICD-10-CM | POA: Diagnosis not present

## 2018-11-09 DIAGNOSIS — Z20828 Contact with and (suspected) exposure to other viral communicable diseases: Secondary | ICD-10-CM | POA: Diagnosis not present

## 2018-11-14 DIAGNOSIS — F332 Major depressive disorder, recurrent severe without psychotic features: Secondary | ICD-10-CM | POA: Diagnosis not present

## 2018-11-14 DIAGNOSIS — F4310 Post-traumatic stress disorder, unspecified: Secondary | ICD-10-CM | POA: Diagnosis not present

## 2018-11-14 DIAGNOSIS — F411 Generalized anxiety disorder: Secondary | ICD-10-CM | POA: Diagnosis not present

## 2018-11-15 DIAGNOSIS — F332 Major depressive disorder, recurrent severe without psychotic features: Secondary | ICD-10-CM | POA: Diagnosis not present

## 2018-11-15 DIAGNOSIS — F4312 Post-traumatic stress disorder, chronic: Secondary | ICD-10-CM | POA: Diagnosis not present

## 2018-11-15 DIAGNOSIS — F3181 Bipolar II disorder: Secondary | ICD-10-CM | POA: Diagnosis not present

## 2018-11-15 DIAGNOSIS — F4310 Post-traumatic stress disorder, unspecified: Secondary | ICD-10-CM | POA: Diagnosis not present

## 2018-11-15 DIAGNOSIS — F902 Attention-deficit hyperactivity disorder, combined type: Secondary | ICD-10-CM | POA: Diagnosis not present

## 2018-11-15 DIAGNOSIS — F411 Generalized anxiety disorder: Secondary | ICD-10-CM | POA: Diagnosis not present

## 2018-11-20 DIAGNOSIS — M9904 Segmental and somatic dysfunction of sacral region: Secondary | ICD-10-CM | POA: Diagnosis not present

## 2018-11-20 DIAGNOSIS — M9903 Segmental and somatic dysfunction of lumbar region: Secondary | ICD-10-CM | POA: Diagnosis not present

## 2018-11-20 DIAGNOSIS — M9905 Segmental and somatic dysfunction of pelvic region: Secondary | ICD-10-CM | POA: Diagnosis not present

## 2018-11-20 DIAGNOSIS — M5136 Other intervertebral disc degeneration, lumbar region: Secondary | ICD-10-CM | POA: Diagnosis not present

## 2018-11-21 DIAGNOSIS — F411 Generalized anxiety disorder: Secondary | ICD-10-CM | POA: Diagnosis not present

## 2018-11-21 DIAGNOSIS — F332 Major depressive disorder, recurrent severe without psychotic features: Secondary | ICD-10-CM | POA: Diagnosis not present

## 2018-11-21 DIAGNOSIS — F4310 Post-traumatic stress disorder, unspecified: Secondary | ICD-10-CM | POA: Diagnosis not present

## 2018-11-28 DIAGNOSIS — F332 Major depressive disorder, recurrent severe without psychotic features: Secondary | ICD-10-CM | POA: Diagnosis not present

## 2018-11-28 DIAGNOSIS — F411 Generalized anxiety disorder: Secondary | ICD-10-CM | POA: Diagnosis not present

## 2018-11-28 DIAGNOSIS — F4310 Post-traumatic stress disorder, unspecified: Secondary | ICD-10-CM | POA: Diagnosis not present

## 2018-12-05 DIAGNOSIS — F332 Major depressive disorder, recurrent severe without psychotic features: Secondary | ICD-10-CM | POA: Diagnosis not present

## 2018-12-05 DIAGNOSIS — F411 Generalized anxiety disorder: Secondary | ICD-10-CM | POA: Diagnosis not present

## 2018-12-05 DIAGNOSIS — F4310 Post-traumatic stress disorder, unspecified: Secondary | ICD-10-CM | POA: Diagnosis not present

## 2018-12-19 DIAGNOSIS — F4310 Post-traumatic stress disorder, unspecified: Secondary | ICD-10-CM | POA: Diagnosis not present

## 2018-12-19 DIAGNOSIS — F411 Generalized anxiety disorder: Secondary | ICD-10-CM | POA: Diagnosis not present

## 2018-12-19 DIAGNOSIS — F332 Major depressive disorder, recurrent severe without psychotic features: Secondary | ICD-10-CM | POA: Diagnosis not present

## 2018-12-26 DIAGNOSIS — F411 Generalized anxiety disorder: Secondary | ICD-10-CM | POA: Diagnosis not present

## 2018-12-26 DIAGNOSIS — F4310 Post-traumatic stress disorder, unspecified: Secondary | ICD-10-CM | POA: Diagnosis not present

## 2018-12-26 DIAGNOSIS — F332 Major depressive disorder, recurrent severe without psychotic features: Secondary | ICD-10-CM | POA: Diagnosis not present

## 2018-12-29 ENCOUNTER — Encounter (HOSPITAL_BASED_OUTPATIENT_CLINIC_OR_DEPARTMENT_OTHER): Payer: Self-pay | Admitting: Emergency Medicine

## 2018-12-29 ENCOUNTER — Emergency Department (HOSPITAL_BASED_OUTPATIENT_CLINIC_OR_DEPARTMENT_OTHER): Payer: Medicaid Other

## 2018-12-29 ENCOUNTER — Other Ambulatory Visit: Payer: Self-pay

## 2018-12-29 ENCOUNTER — Emergency Department (HOSPITAL_BASED_OUTPATIENT_CLINIC_OR_DEPARTMENT_OTHER)
Admission: EM | Admit: 2018-12-29 | Discharge: 2018-12-29 | Disposition: A | Discharge status: Home / Self Care | Payer: Medicaid Other | Attending: Emergency Medicine | Admitting: Emergency Medicine

## 2018-12-29 DIAGNOSIS — Y929 Unspecified place or not applicable: Secondary | ICD-10-CM | POA: Insufficient documentation

## 2018-12-29 DIAGNOSIS — S6992XA Unspecified injury of left wrist, hand and finger(s), initial encounter: Secondary | ICD-10-CM | POA: Diagnosis not present

## 2018-12-29 DIAGNOSIS — Z79899 Other long term (current) drug therapy: Secondary | ICD-10-CM | POA: Insufficient documentation

## 2018-12-29 DIAGNOSIS — Y939 Activity, unspecified: Secondary | ICD-10-CM | POA: Diagnosis not present

## 2018-12-29 DIAGNOSIS — X500XXA Overexertion from strenuous movement or load, initial encounter: Secondary | ICD-10-CM | POA: Insufficient documentation

## 2018-12-29 DIAGNOSIS — S63502A Unspecified sprain of left wrist, initial encounter: Secondary | ICD-10-CM | POA: Diagnosis not present

## 2018-12-29 DIAGNOSIS — Y990 Civilian activity done for income or pay: Secondary | ICD-10-CM | POA: Diagnosis not present

## 2018-12-29 DIAGNOSIS — M25532 Pain in left wrist: Secondary | ICD-10-CM | POA: Diagnosis not present

## 2018-12-29 NOTE — Discharge Instructions (Addendum)
You have been diagnosed today with left wrist sprain.  At this time there does not appear to be the presence of an emergent medical condition, however there is always the potential for conditions to change. Please read and follow the below instructions.  Please return to the Emergency Department immediately for any new or worsening symptoms. Please be sure to follow up with your Primary Care Provider within one week regarding your visit today; please call their office to schedule an appointment even if you are feeling better for a follow-up visit. Please call Dr. Doristine Locks office, the sports medicine specialist for further evaluation and treatment of your left wrist pain.  As we discussed abnormalities such as ligamentous, tendon injury or unseen fractures may be present and follow-up with the sports medicine specialist is advised. Please use rest, ice and elevation to help with your symptoms.  Get help right away if: You lose feeling in your fingers or hand. Your fingers turn white, very red, or cold and blue. You cannot move your fingers. You have a fever or chills. Any new/concerning or worsening symptoms  Please read the additional information packets attached to your discharge summary.  Do not take your medicine if  develop an itchy rash, swelling in your mouth or lips, or difficulty breathing; call 911 and seek immediate emergency medical attention if this occurs.

## 2018-12-29 NOTE — ED Provider Notes (Addendum)
[facility] EMERGENCY DEPARTMENT Provider Note   CSN: [id number] Arrival date & time: 12/29/18  1200     History   Chief Complaint Chief Complaint  Patient presents with  . Wrist Pain    HPI Maria Ho is a 38 y.o. female presents today for pain of the left wrist.  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  Patient reports that during work today a box was heavier than she expected causing her to extend her left wrist.  She reports that she did not drop the box or fall.  No trauma.  She reports she had immediate onset of a moderate intensity aching pain worsened with movement and palpation and improved with rest, she denies radiation of pain left wrist injury in the past.  Patient denies any weakness, numbness, tingling, swelling/color change, skin break or any additional concerns today.    HPI  Past Medical History:  Diagnosis Date  . Anxiety    PTSD  . Arrhythmia   . Headache(784.0)    Migraines  . HSV (herpes simplex virus) infection    no outbreak during current pregnancy  . Migraine     Patient Active Problem List   Diagnosis Date Noted  . PTSD (post-traumatic stress disorder) 01/04/2016  . Chronic back pain 01/04/2016  . MDD (major depressive disorder), recurrent severe, without psychosis ([place]) 01/03/2016  . Chronic post-traumatic stress disorder (PTSD) 01/03/2016  . Numbness 10/30/2014  . Memory disturbance 10/30/2014  . Other fatigue 10/30/2014  . Anxiety 05/06/2013  . Dermatitis, eczematoid 05/06/2013  . Genital herpes 05/06/2013  . Cannot sleep 05/06/2013  . Oral herpes 05/06/2013  . Palpitations 06/15/2011  . Bleeding in early pregnancy 04/07/2011    Past Surgical History:  Procedure Laterality Date  . NO PAST SURGERIES    . None       OB History    Gravida  14   Para  4   Term  4   Preterm      AB  10   Living  4     SAB  1   TAB  9   Ectopic      Multiple      Live Births  4            Home Medications    Prior to Admission medications   Medication Sig Start Date End Date Taking? Authorizing Provider  ALPRAZolam Duanne Moron) 1 MG tablet Take 1 mg by mouth at bedtime as needed for anxiety.    [provider]  clonazePAM (KLONOPIN) 0.5 MG tablet Take 0.5 mg by mouth 2 (two) times daily as needed for anxiety.    [provider]  desvenlafaxine (PRISTIQ) 100 MG 24 hr tablet Take 100 mg by mouth daily.    [provider]  ibuprofen (ADVIL,MOTRIN) 800 MG tablet Take 1 tablet (800 mg total) by mouth 3 (three) times daily. 08/25/16   Antonietta Breach, PA-C  indomethacin (INDOCIN) 50 MG capsule Take 1 capsule (50 mg total) by mouth 2 (two) times daily with a meal. 04/11/17   Etta Quill, NP  lamoTRIgine (LAMICTAL) 100 MG tablet Take 100 mg by mouth daily.    [provider]  traZODone (DESYREL) 100 MG tablet Take 1 tablet (100 mg total) by mouth at bedtime as needed for sleep. 01/07/16   Encarnacion Slates, NP    Family History Family History  Problem Relation Age of  Onset  . Post-traumatic stress disorder Father   . Alcohol abuse Father   . Drug abuse Father   . Healthy Mother   . Diabetes Maternal Grandmother   . Cancer Paternal Grandmother        Great-Grandparent  . Cancer Paternal Grandfather        Great-Gradnparent    Social History Social History   Tobacco Use  . Smoking status: Never Smoker  . Smokeless tobacco: Never Used  Substance Use Topics  . Alcohol use: Yes    Comment: 1-2 drinks 2-3 x week  . Drug use: Yes    Types: Marijuana    Comment: 2-3 x week     Allergies   Patient has no known allergies.   Review of Systems Review of Systems Ten systems are reviewed and are negative for acute change except as noted in the HPI  Physical Exam Updated Vital Signs BP (!) 135/98 (BP Location: Right Arm)   Pulse (!) 101   Temp 98.7 F (37.1 C) (Oral)   Resp 18   Ht 5\' 9"  (1.753  m)   Wt 90.7 kg   SpO2 98%   BMI 29.53 kg/m   Physical Exam Constitutional:      General: She is not in acute distress.    Appearance: Normal appearance. She is well-developed. She is not ill-appearing or diaphoretic.  HENT:     Head: Normocephalic and atraumatic.     Right Ear: External ear normal.     Left Ear: External ear normal.     Nose: Nose normal.  Eyes:     General: Vision grossly intact. Gaze aligned appropriately.     Pupils: Pupils are equal, round, and reactive to light.  Neck:     Musculoskeletal: Normal range of motion.     Trachea: Trachea and phonation normal. No tracheal deviation.  Pulmonary:     Effort: Pulmonary effort is normal. No respiratory distress.  Musculoskeletal: Normal range of motion.     Left wrist: She exhibits normal range of motion, no tenderness, no bony tenderness, no swelling, no crepitus and no deformity.     Comments: Left hand: No gross deformities, skin intact. Fingers appear normal.  No tenderness to palpation.  Patient only with pain with maximal extension of the left wrist.  No snuffbox tenderness to palpation. No tenderness to palpation over flexor sheath.  Finger adduction/abduction intact with 5/5 strength.  Thumb opposition intact. Full active and resisted ROM to flexion/extension at wrist, MCP, PIP and DIP of all fingers.  FDS/FDP intact. Grip 5/5 strength.  Radial artery 2+ with <2sec cap refill in all fingers.  Sensation intact to light-tough in median/ulnar/radial distributions.  Compartments soft to palpation.  Skin:    General: Skin is warm and dry.  Neurological:     Mental Status: She is alert.     GCS: GCS eye subscore is 4. GCS verbal subscore is 5. GCS motor subscore is 6.     Comments: Speech is clear and goal oriented, follows commands Major Cranial nerves without deficit, no facial droop Normal strength in upper extremities bilaterally including strong and equal grip strength Sensation normal to light and sharp  touch Moves extremities without ataxia, coordination intact  Psychiatric:        Behavior: Behavior normal.    ED Treatments / Results  Labs (all labs ordered are listed, but only abnormal results are displayed) Labs Reviewed - No data to display  EKG None  Radiology Dg Wrist  Complete Left  Result Date: 12/29/2018 CLINICAL DATA:  Hyperextension injury of the left wrist today with pain. EXAM: LEFT WRIST - COMPLETE 3+ VIEW COMPARISON:  None. FINDINGS: No triquetral fracture or other fracture involving the wrist is identified. Pronator fat pad normal. IMPRESSION: Negative. Electronically Signed   By: Van Clines M.D.   On: 12/29/2018 12:45    Procedures Procedures (including critical care time)  Medications Ordered in ED Medications - No data to display   Initial Impression / Assessment and Plan / ED Course  I have reviewed the triage vital signs and the nursing notes.  Pertinent labs & imaging results that were available during my care of the patient were reviewed by me and considered in my medical decision making (see chart for details).     DG Left Wrist: IMPRESSION: Negative. - 38 year old female presents today with history and physical examination consistent with left wrist sprain.  Extremity neurovascularly intact without sign of infection, septic joint, DVT, compartment syndrome, gross ligamentous laxity or other emergent pathologies.  Patient with full range of motion and 5/5 strength with all movements of the left upper extremity however she does report some increased pain with maximal wrist extension.  No pain at the elbow, shoulder and no other concerns today.  She will be placed in a wrist splint and given sports medicine referral.  Additionally patient without tenderness about the scaphoid or with movement of the thumb, no indication for thumb spica splint today.  Discussed rice therapy.  At this time there does not appear to be any evidence of an acute emergency  medical condition and the patient appears stable for discharge with appropriate outpatient follow up. Diagnosis was discussed with patient who verbalizes understanding of care plan and is agreeable to discharge. I have discussed return precautions with patient who verbalizes understanding of return precautions. Patient encouraged to follow-up with their PCP and sports medicine. All questions answered.  Patient has been discharged in good condition.  Note: Portions of this report may have been transcribed using voice recognition software. Every effort was made to ensure accuracy; however, inadvertent computerized transcription errors may still be present. Final Clinical Impressions(s) / ED Diagnoses   Final diagnoses:  Sprain of left wrist, initial encounter    ED Discharge Orders    None       Deliah Boston, PA-C 12/29/18 1358    Gari Crown 12/29/18 1401    Davonna Belling, MD 12/29/18 [phone]

## 2018-12-29 NOTE — ED Triage Notes (Signed)
L wrist pain after lifting a box at work today.

## 2019-01-02 DIAGNOSIS — F332 Major depressive disorder, recurrent severe without psychotic features: Secondary | ICD-10-CM | POA: Diagnosis not present

## 2019-01-02 DIAGNOSIS — F4310 Post-traumatic stress disorder, unspecified: Secondary | ICD-10-CM | POA: Diagnosis not present

## 2019-01-02 DIAGNOSIS — F411 Generalized anxiety disorder: Secondary | ICD-10-CM | POA: Diagnosis not present

## 2019-01-17 DIAGNOSIS — Z13228 Encounter for screening for other metabolic disorders: Secondary | ICD-10-CM | POA: Diagnosis not present

## 2019-01-17 DIAGNOSIS — Z13220 Encounter for screening for lipoid disorders: Secondary | ICD-10-CM | POA: Diagnosis not present

## 2019-01-17 DIAGNOSIS — R5383 Other fatigue: Secondary | ICD-10-CM | POA: Diagnosis not present

## 2019-01-17 DIAGNOSIS — T8332XS Displacement of intrauterine contraceptive device, sequela: Secondary | ICD-10-CM | POA: Diagnosis not present

## 2019-01-17 DIAGNOSIS — Z113 Encounter for screening for infections with a predominantly sexual mode of transmission: Secondary | ICD-10-CM | POA: Diagnosis not present

## 2019-01-17 DIAGNOSIS — N939 Abnormal uterine and vaginal bleeding, unspecified: Secondary | ICD-10-CM | POA: Diagnosis not present

## 2019-01-17 DIAGNOSIS — D219 Benign neoplasm of connective and other soft tissue, unspecified: Secondary | ICD-10-CM | POA: Diagnosis not present

## 2019-01-23 DIAGNOSIS — F332 Major depressive disorder, recurrent severe without psychotic features: Secondary | ICD-10-CM | POA: Diagnosis not present

## 2019-01-23 DIAGNOSIS — F411 Generalized anxiety disorder: Secondary | ICD-10-CM | POA: Diagnosis not present

## 2019-01-23 DIAGNOSIS — F4310 Post-traumatic stress disorder, unspecified: Secondary | ICD-10-CM | POA: Diagnosis not present

## 2019-01-30 DIAGNOSIS — F4310 Post-traumatic stress disorder, unspecified: Secondary | ICD-10-CM | POA: Diagnosis not present

## 2019-01-30 DIAGNOSIS — F411 Generalized anxiety disorder: Secondary | ICD-10-CM | POA: Diagnosis not present

## 2019-01-30 DIAGNOSIS — F332 Major depressive disorder, recurrent severe without psychotic features: Secondary | ICD-10-CM | POA: Diagnosis not present

## 2019-01-30 DIAGNOSIS — F4312 Post-traumatic stress disorder, chronic: Secondary | ICD-10-CM | POA: Diagnosis not present

## 2019-01-30 DIAGNOSIS — F902 Attention-deficit hyperactivity disorder, combined type: Secondary | ICD-10-CM | POA: Diagnosis not present

## 2019-01-30 DIAGNOSIS — F3181 Bipolar II disorder: Secondary | ICD-10-CM | POA: Diagnosis not present

## 2019-03-06 DIAGNOSIS — Z20828 Contact with and (suspected) exposure to other viral communicable diseases: Secondary | ICD-10-CM | POA: Diagnosis not present

## 2019-03-11 DIAGNOSIS — Z20828 Contact with and (suspected) exposure to other viral communicable diseases: Secondary | ICD-10-CM | POA: Diagnosis not present

## 2019-03-15 DIAGNOSIS — M5442 Lumbago with sciatica, left side: Secondary | ICD-10-CM | POA: Diagnosis not present

## 2019-03-22 DIAGNOSIS — N760 Acute vaginitis: Secondary | ICD-10-CM | POA: Diagnosis not present

## 2019-03-22 DIAGNOSIS — B9689 Other specified bacterial agents as the cause of diseases classified elsewhere: Secondary | ICD-10-CM | POA: Diagnosis not present

## 2019-03-22 DIAGNOSIS — M5442 Lumbago with sciatica, left side: Secondary | ICD-10-CM | POA: Diagnosis not present

## 2019-04-01 ENCOUNTER — Ambulatory Visit: Payer: Medicaid Other | Admitting: Advanced Practice Midwife

## 2019-04-03 DIAGNOSIS — M47816 Spondylosis without myelopathy or radiculopathy, lumbar region: Secondary | ICD-10-CM | POA: Diagnosis not present

## 2019-04-11 DIAGNOSIS — F411 Generalized anxiety disorder: Secondary | ICD-10-CM | POA: Diagnosis not present

## 2019-04-11 DIAGNOSIS — F332 Major depressive disorder, recurrent severe without psychotic features: Secondary | ICD-10-CM | POA: Diagnosis not present

## 2019-04-11 DIAGNOSIS — F4310 Post-traumatic stress disorder, unspecified: Secondary | ICD-10-CM | POA: Diagnosis not present

## 2019-04-18 DIAGNOSIS — M5416 Radiculopathy, lumbar region: Secondary | ICD-10-CM | POA: Diagnosis not present

## 2019-04-18 DIAGNOSIS — M5136 Other intervertebral disc degeneration, lumbar region: Secondary | ICD-10-CM | POA: Diagnosis not present

## 2019-04-18 DIAGNOSIS — M47816 Spondylosis without myelopathy or radiculopathy, lumbar region: Secondary | ICD-10-CM | POA: Diagnosis not present

## 2019-04-18 DIAGNOSIS — M7918 Myalgia, other site: Secondary | ICD-10-CM | POA: Diagnosis not present

## 2019-04-24 DIAGNOSIS — M4726 Other spondylosis with radiculopathy, lumbar region: Secondary | ICD-10-CM | POA: Diagnosis not present

## 2019-04-24 DIAGNOSIS — M5136 Other intervertebral disc degeneration, lumbar region: Secondary | ICD-10-CM | POA: Diagnosis not present

## 2019-04-29 ENCOUNTER — Ambulatory Visit: Payer: Medicaid Other | Admitting: Obstetrics & Gynecology

## 2019-05-20 ENCOUNTER — Ambulatory Visit: Payer: Medicaid Other | Attending: Nurse Practitioner | Admitting: Physical Therapy

## 2019-05-20 ENCOUNTER — Ambulatory Visit: Payer: Medicaid Other | Admitting: Obstetrics and Gynecology

## 2019-05-20 DIAGNOSIS — Z20822 Contact with and (suspected) exposure to covid-19: Secondary | ICD-10-CM | POA: Diagnosis not present

## 2019-05-20 DIAGNOSIS — R519 Headache, unspecified: Secondary | ICD-10-CM | POA: Diagnosis not present

## 2019-05-23 DIAGNOSIS — M5416 Radiculopathy, lumbar region: Secondary | ICD-10-CM | POA: Diagnosis not present

## 2019-05-23 DIAGNOSIS — M47816 Spondylosis without myelopathy or radiculopathy, lumbar region: Secondary | ICD-10-CM | POA: Diagnosis not present

## 2019-05-23 DIAGNOSIS — M5136 Other intervertebral disc degeneration, lumbar region: Secondary | ICD-10-CM | POA: Diagnosis not present

## 2019-05-24 DIAGNOSIS — F4312 Post-traumatic stress disorder, chronic: Secondary | ICD-10-CM | POA: Diagnosis not present

## 2019-05-24 DIAGNOSIS — F411 Generalized anxiety disorder: Secondary | ICD-10-CM | POA: Diagnosis not present

## 2019-05-24 DIAGNOSIS — F331 Major depressive disorder, recurrent, moderate: Secondary | ICD-10-CM | POA: Diagnosis not present

## 2019-05-24 DIAGNOSIS — G244 Idiopathic orofacial dystonia: Secondary | ICD-10-CM | POA: Diagnosis not present

## 2019-05-24 DIAGNOSIS — T50905A Adverse effect of unspecified drugs, medicaments and biological substances, initial encounter: Secondary | ICD-10-CM | POA: Diagnosis not present

## 2019-05-24 DIAGNOSIS — G43009 Migraine without aura, not intractable, without status migrainosus: Secondary | ICD-10-CM | POA: Diagnosis not present

## 2019-05-27 ENCOUNTER — Encounter: Payer: Self-pay | Admitting: Obstetrics and Gynecology

## 2019-05-27 ENCOUNTER — Other Ambulatory Visit (HOSPITAL_COMMUNITY)
Admission: RE | Admit: 2019-05-27 | Discharge: 2019-05-27 | Disposition: A | Discharge status: Home / Self Care | Payer: Medicaid Other | Source: Ambulatory Visit | Attending: Obstetrics and Gynecology | Admitting: Obstetrics and Gynecology

## 2019-05-27 ENCOUNTER — Other Ambulatory Visit: Payer: Self-pay

## 2019-05-27 ENCOUNTER — Ambulatory Visit: Payer: Medicaid Other | Admitting: Obstetrics and Gynecology

## 2019-05-27 VITALS — BP 137/87 | HR 106 | Temp 98.7°F | Ht 69.0 in | Wt 225.8 lb

## 2019-05-27 DIAGNOSIS — Z0000 Encounter for general adult medical examination without abnormal findings: Secondary | ICD-10-CM | POA: Diagnosis not present

## 2019-05-27 DIAGNOSIS — Z30433 Encounter for removal and reinsertion of intrauterine contraceptive device: Secondary | ICD-10-CM

## 2019-05-27 DIAGNOSIS — Z30430 Encounter for insertion of intrauterine contraceptive device: Secondary | ICD-10-CM

## 2019-05-27 DIAGNOSIS — Z30432 Encounter for removal of intrauterine contraceptive device: Secondary | ICD-10-CM | POA: Diagnosis not present

## 2019-05-27 DIAGNOSIS — Z01411 Encounter for gynecological examination (general) (routine) with abnormal findings: Secondary | ICD-10-CM | POA: Insufficient documentation

## 2019-05-27 MED ORDER — LEVONORGESTREL 20 MCG/24HR IU IUD: INTRAUTERINE_SYSTEM | Freq: Once | INTRAUTERINE | Status: AC

## 2019-05-27 NOTE — Progress Notes (Signed)
Subjective:     Maria Ho is a 39 y.o. female P4 with BMI 33 who is here for a comprehensive physical exam. The patient reports irregular vaginal bleeding with vaginal bleeding lasting at times 10-20 days. Patient with Mirena IUD in place for 7 years due for removal. Patient with known fibroid uterus. She was prescribed COC for cycle control but patient has not taken them. Patient reports the presence of a vaginal discharge concerning for BV and desires testing. Patient is sexually active without complaints.  Past Medical History:  Diagnosis Date  . Anemia   . Anxiety    PTSD  . Arrhythmia   . Arthritis   . Depression   . Fibroid   . Headache(784.0)    Migraines  . HSV (herpes simplex virus) infection    no outbreak during current pregnancy  . Migraine    Past Surgical History:  Procedure Laterality Date  . NO PAST SURGERIES    . None     Family History  Problem Relation Age of Onset  . Post-traumatic stress disorder Father   . Alcohol abuse Father   . Drug abuse Father   . Healthy Mother   . Diabetes Maternal Grandmother   . Cancer Paternal Grandmother        Great-Grandparent  . Cancer Paternal Grandfather        Great-Gradnparent    Social History   Socioeconomic History  . Marital status: Single    Spouse name: Not on file  . Number of children: 3  . Years of education: Not on file  . Highest education level: Not on file  Occupational History    Employer: [employer]  Tobacco Use  . Smoking status: Never Smoker  . Smokeless tobacco: Never Used  Substance and Sexual Activity  . Alcohol use: Yes    Comment: 1-2 drinks 2-3 x week  . Drug use: Yes    Types: Marijuana    Comment: 2-3 x week  . Sexual activity: Yes    Birth control/protection: I.U.D.  Other Topics Concern  . Not on file  Social History Narrative   Lives with three children.   Occasional caffeine.   Social Determinants of Health   Financial Resource Strain:   . Difficulty of Paying  Living Expenses: Not on file  Food Insecurity:   . Worried About Charity fundraiser in the Last Year: Not on file  . Ran Out of Food in the Last Year: Not on file  Transportation Needs:   . Lack of Transportation (Medical): Not on file  . Lack of Transportation (Non-Medical): Not on file  Physical Activity:   . Days of Exercise per Week: Not on file  . Minutes of Exercise per Session: Not on file  Stress:   . Feeling of Stress : Not on file  Social Connections:   . Frequency of Communication with Friends and Family: Not on file  . Frequency of Social Gatherings with Friends and Family: Not on file  . Attends Religious Services: Not on file  . Active Member of Clubs or Organizations: Not on file  . Attends Archivist Meetings: Not on file  . Marital Status: Not on file  Intimate Partner Violence:   . Fear of Current or Ex-Partner: Not on file  . Emotionally Abused: Not on file  . Physically Abused: Not on file  . Sexually Abused: Not on file   Health Maintenance  Topic Date Due  . PAP SMEAR-Modifier  05/12/2000  . INFLUENZA VACCINE  12/01/2018  . TETANUS/TDAP  11/30/2023  . HIV Screening  Completed       Review of Systems Pertinent items noted in HPI and remainder of comprehensive ROS otherwise negative.   Objective:  Blood pressure 137/87, pulse (!) 106, temperature 98.7 F (37.1 C), height 5\' 9"  (1.753 m), weight 225 lb 12.8 oz (102.4 kg).     GENERAL: Well-developed, well-nourished female in no acute distress.  HEENT: Normocephalic, atraumatic. Sclerae anicteric.  NECK: Supple. Normal thyroid.  LUNGS: Clear to auscultation bilaterally.  HEART: Regular rate and rhythm. BREASTS: Symmetric in size. No palpable masses or lymphadenopathy, skin changes, or nipple drainage. ABDOMEN: Soft, nontender, nondistended. No organomegaly. PELVIC: Normal external female genitalia. Vagina is pink and rugated.  Normal discharge. Normal appearing cervix. IUD strings not  visualized. Uterus is normal is 14-weeks in size. No adnexal mass or tenderness. EXTREMITIES: No cyanosis, clubbing, or edema, 2+ distal pulses.    Assessment:    Healthy female exam.      Plan:    Pap smear collected Cultures collected Pelvic ultrasound ordered Screening mammogram ordered Discussed medical management of DUB associated with fibroid uterus with Mirena vs myomectomy vs hysterectomy. Patient opted for Mirena IUD  [place] NOTE  IUD Removal  Patient identified, informed consent performed, consent signed.  Patient was in the dorsal lithotomy position, normal external genitalia was noted.  A speculum was placed in the patient's vagina, normal discharge was noted, no lesions. The cervix was visualized, no lesions, no abnormal discharge.  The strings of the IUD were not visualized, so Kelly forceps were introduced into the endometrial cavity and the IUD was grasped and removed in its entirety.  Patient desires new IUD insertion. Mirena IUD placed per manufacturer's recommendations.  Strings trimmed to 2 cm. Tenaculum was removed, good hemostasis noted.  Patient tolerated procedure well.   Patient given post procedure instructions and Mirena care card with expiration date.  Patient is asked to check IUD strings periodically and follow up in 4-6 weeks for IUD check.   See After Visit Summary for Counseling Recommendations

## 2019-05-27 NOTE — Progress Notes (Signed)
New GYN presents for AEX/PAP. IUD removal and insert. Mirena was inserted 7-8 years ago.  UPT today is Negative

## 2019-05-27 NOTE — Patient Instructions (Signed)
Myomectomy    Myomectomy is a surgery in which a non-cancerous fibroid (myoma) is removed from the uterus. Myomas are tumors made up of fibrous tissue. They are often called fibroid tumors. Fibroid tumors can range from the size of a pea to the size of a grapefruit. In a myomectomy, the fibroid tumor is removed without removing the uterus. Because these tumors are rarely cancerous, this surgery is usually done only if the tumor is growing or causing symptoms such as pain, pressure, bleeding, or pain with intercourse.  Tell a health care provider about:  · Any allergies you have.  · All medicines you are taking, including vitamins, herbs, eye drops, creams, and over-the-counter medicines.  · Any problems you or family members have had with anesthetic medicines.  · Any blood disorders you have.  · Any surgeries you have had.  · Any medical conditions you have.  What are the risks?  Generally, this is a safe procedure. However, problems may occur, including:  · Bleeding.  · Infection.  · Allergic reactions to medicines.  · Damage to other structures or organs.  · Blood clots in the legs, chest, or brain.  · Scar tissue on other organs and in the pelvis. This may require another surgery to remove the scar tissue.  What happens before the procedure?  Staying hydrated  Follow instructions from your health care provider about hydration, which may include:  · Up to 2 hours before the procedure - you may continue to drink clear liquids, such as water, clear fruit juice, black coffee, and plain tea.  Eating and drinking restrictions  Follow instructions from your health care provider about eating and drinking, which may include:  · 8 hours before the procedure - stop eating heavy meals or foods such as meat, fried foods, or fatty foods.  · 6 hours before the procedure - stop eating light meals or foods, such as toast or cereal.  · 6 hours before the procedure - stop drinking milk or drinks that contain milk.  · 2 hours before  the procedure - stop drinking clear liquids.  General instructions  · Ask your health care provider about:  ? Changing or stopping your regular medicines. This is especially important if you are taking diabetes medicines or blood thinners.  ? Taking medicines such as aspirin and ibuprofen. These medicines can thin your blood. Do not take these medicines before your procedure if your health care provider instructs you not to.  · Do not drink alcohol the day before the surgery.  · Do not use any products that contain nicotine or tobacco, such as cigarettes and e-cigarettes, for 2 weeks before the procedure. If you need help quitting, ask your health care provider.  · Plan to have someone take you home from the hospital or clinic. Also arrange for someone to help you with activities during your recovery.  What happens during the procedure?  · To reduce your risk of infection:  ? Your health care team will wash or sanitize their hands.  ? Your skin will be washed with soap.  ? Hair may be removed from the surgical area.  · An IV tube will be inserted into one of your veins. Medicines will be able to flow directly into your body through this IV tube.  · You will be given one or more of the following:  ? A medicine to help you relax (sedative).  ? A medicine to make you fall asleep (general anesthetic).  ·   into your bladder to collect urine.  Your surgeon will use one of the following methods to perform the procedure. The method used will depend on the size, shape, location, and number of fibroids. Hysteroscopic myomectomy This method may be used when the fibroid tumor is inside the cavity of the uterus. A long, thin tube with a lens (hysteroscope) will be inserted into the  uterus through the vagina. A saline solution will be put into the uterus. This will expand the uterus and allow the surgeon to see the fibroids. Tools will be passed through the hysteroscope to remove the fibroid tumor in pieces. Laparoscopic myomectomy A few small incisions will be made in the lower abdomen. A thin, lighted tube with a camera (laparoscope) will be inserted through one of the incisions. This will give the surgeon a good view of the area. The fibroid tumor will be removed through the other incisions. The incisions will then be closed with stitches (sutures) or staples. Abdominal myomectomy This method is used when the fibroid tumor cannot be removed with a hysteroscope or laparoscope. The surgery will be done through a larger surgical incision in the abdomen. The fibroid tumor will be removed through this incision. The incision will be closed with sutures or staples. Recovery time will be longer if this method is used. The procedures may vary among health care providers and hospitals. What happens after the procedure?  Your blood pressure, heart rate, breathing rate, and blood oxygen level will be monitored until the medicines you were given have worn off.  The IV access tube and catheter will remain on your body for a period of time.  You may be given medicine for pain or to help you sleep.  You may be given an antibiotic medicine if needed.  Do not drive for 24 hours if you were given a sedative. Summary  Myomectomy is surgery to remove a noncancerous fibroid (myoma) from the uterus.  This surgery is usually done only if the tumor is growing or causing symptoms such as pain, pressure, bleeding, or pain during intercourse.  Follow instructions from your health care provider about eating and drinking before the procedure.  Recovery time from this procedure depends on the method. The abdominal method will require a longer recovery time. This information is not intended to  replace advice given to you by your health care provider. Make sure you discuss any questions you have with your health care provider. Document Revised: 08/10/2018 Document Reviewed: 05/19/2016 Elsevier Patient Education  [place].   Hysterectomy Information  A hysterectomy is a surgery in which the uterus is removed. The fallopian tubes and ovaries may be removed (bilateral salpingo-oophorectomy) as well. This procedure may be done to treat various medical problems. After the procedure, a woman will no longer have menstrual periods nor will she be able to become pregnant (sterile). What are the reasons for a hysterectomy? There are many reasons why a woman might have this procedure. They include:  Persistent, abnormal vaginal bleeding.  Long-term (chronic) pelvic pain or infection.  Endometriosis. This is when the lining of the uterus (endometrium) starts to grow outside the uterus.  Adenomyosis. This is when the endometrium starts to grow in the muscle of the uterus.  Pelvic organ prolapse. This is a condition in which the uterus falls down into the vagina.  Noncancerous growths in the uterus (uterine fibroids) that cause symptoms.  The presence of precancerous cells.  Cervical or uterine cancer. What are the different types  of hysterectomy? There are three different types of hysterectomy:  Supracervical hysterectomy. In this type, the top part of the uterus is removed, but not the cervix.  Total hysterectomy. In this type, the uterus and cervix are removed.  Radical hysterectomy. In this type, the uterus, the cervix, and the tissue that holds the uterus in place (parametrium) are removed. What are the different ways a hysterectomy can be performed? There are many different ways a hysterectomy can be performed, including:  Abdominal hysterectomy. In this type, an incision is made in the abdomen. The uterus is removed through this incision.  Vaginal hysterectomy. In  this type, an incision is made in the vagina. The uterus is removed through this incision. There are no abdominal incisions.  Conventional laparoscopic hysterectomy. In this type, three or four small incisions are made in the abdomen. A thin, lighted tube with a camera (laparoscope) is inserted into one of the incisions. Other tools are put through the other incisions. The uterus is cut into small pieces. The small pieces are removed through the incisions or through the vagina.  Laparoscopically assisted vaginal hysterectomy (LAVH). In this type, three or four small incisions are made in the abdomen. Part of the surgery is performed laparoscopically and the other part is done vaginally. The uterus is removed through the vagina.  Robot-assisted laparoscopic hysterectomy. In this type, a laparoscope and other tools are inserted into three or four small incisions in the abdomen. A computer-controlled device is used to give the surgeon a 3D image and to help control the surgical instruments. This allows for more precise movements of surgical instruments. The uterus is cut into small pieces and removed through the incisions or removed through the vagina. Discuss the options with your health care provider to determine which type is the right one for you. What are the risks? Generally, this is a safe procedure. However, problems may occur, including:  Bleeding and risk of blood transfusion. Tell your health care provider if you do not want to receive any blood products.  Blood clots in the legs or lung.  Infection.  Damage to other structures or organs.  Allergic reactions to medicines.  Changing to an abdominal hysterectomy from one of the other techniques. What to expect after a hysterectomy  You will be given pain medicine.  You may need to stay in the hospital for 1- 2 days to recover, depending on the type of hysterectomy you had.  Follow your health care provider's instructions about  exercise, driving, and general activities. Ask your health care provider what activities are safe for you.  You will need to have someone with you for the first 3-5 days after you go home.  You will need to follow up with your surgeon in 2-4 weeks after surgery to evaluate your progress.  If the ovaries are removed, you will have early menopause symptoms such as hot flashes, night sweats, and insomnia.  If you had a hysterectomy for a problem that was not cancer or not a condition that could lead to cancer, then you no longer need Pap tests. However, even if you no longer need a Pap test, a regular pelvic exam is a good idea to make sure no other problems are developing. Questions to ask your health care provider  Is a hysterectomy medically necessary? Do I have other treatment options for my condition?  What are my options for hysterectomy procedure?  What organs and tissues need to be removed?  What are the  risks?  What are the benefits?  How long will I need to stay in the hospital after the procedure?  How long will I need to recover at home?  What symptoms can I expect after the procedure? Summary  A hysterectomy is a surgery in which the uterus is removed. The fallopian tubes and ovaries may be removed (bilateral salpingo-oophorectomy) as well.  This procedure may be done to treat various medical problems. After the procedure, a woman will no longer have menstrual periods nor will she be able to become pregnant.  Discuss the options with your health care provider to determine which type of hysterectomy is the right one for you. This information is not intended to replace advice given to you by your health care provider. Make sure you discuss any questions you have with your health care provider. Document Revised: 03/31/2017 Document Reviewed: 05/25/2016 Elsevier Patient Education  2020 [employer].

## 2019-05-28 LAB — CERVICOVAGINAL ANCILLARY ONLY
Bacterial Vaginitis (gardnerella): POSITIVE — AB
Candida Glabrata: NEGATIVE
Candida Vaginitis: NEGATIVE
Chlamydia: NEGATIVE
Comment: NEGATIVE
Comment: NEGATIVE
Comment: NEGATIVE
Comment: NEGATIVE
Comment: NEGATIVE
Comment: NORMAL
Neisseria Gonorrhea: NEGATIVE
Trichomonas: NEGATIVE

## 2019-05-29 LAB — CYTOLOGY - PAP
Comment: NEGATIVE
Diagnosis: NEGATIVE
High risk HPV: NEGATIVE

## 2019-05-29 MED ORDER — METRONIDAZOLE 0.75 % VA GEL: 1.0000 | Freq: Every day | VAGINAL | 1 refills | Status: DC

## 2019-05-29 NOTE — Addendum Note (Signed)
Addended by: Mora Bellman on: 05/29/2019 08:37 AM   Modules accepted: Orders

## 2019-05-30 ENCOUNTER — Telehealth: Payer: Self-pay

## 2019-05-30 NOTE — Telephone Encounter (Signed)
Attempted to contact pt regarding results  Pt not ava call went directly to vm  Left detailed message for pt to contact the office.

## 2019-06-05 ENCOUNTER — Ambulatory Visit (HOSPITAL_COMMUNITY): Payer: Medicaid Other | Attending: Obstetrics and Gynecology

## 2019-06-24 ENCOUNTER — Ambulatory Visit: Payer: Medicaid Other | Admitting: Obstetrics and Gynecology

## 2019-07-04 DIAGNOSIS — F332 Major depressive disorder, recurrent severe without psychotic features: Secondary | ICD-10-CM | POA: Diagnosis not present

## 2019-07-04 DIAGNOSIS — F4310 Post-traumatic stress disorder, unspecified: Secondary | ICD-10-CM | POA: Diagnosis not present

## 2019-07-04 DIAGNOSIS — F411 Generalized anxiety disorder: Secondary | ICD-10-CM | POA: Diagnosis not present

## 2019-09-09 DIAGNOSIS — F4312 Post-traumatic stress disorder, chronic: Secondary | ICD-10-CM | POA: Diagnosis not present

## 2019-09-09 DIAGNOSIS — F3181 Bipolar II disorder: Secondary | ICD-10-CM | POA: Diagnosis not present

## 2019-09-09 DIAGNOSIS — F332 Major depressive disorder, recurrent severe without psychotic features: Secondary | ICD-10-CM | POA: Diagnosis not present

## 2019-09-09 DIAGNOSIS — F4310 Post-traumatic stress disorder, unspecified: Secondary | ICD-10-CM | POA: Diagnosis not present

## 2019-09-09 DIAGNOSIS — F411 Generalized anxiety disorder: Secondary | ICD-10-CM | POA: Diagnosis not present

## 2019-09-09 DIAGNOSIS — F902 Attention-deficit hyperactivity disorder, combined type: Secondary | ICD-10-CM | POA: Diagnosis not present

## 2019-10-01 ENCOUNTER — Encounter (HOSPITAL_COMMUNITY): Payer: Self-pay | Admitting: Emergency Medicine

## 2019-10-01 ENCOUNTER — Emergency Department (HOSPITAL_COMMUNITY): Payer: Medicaid Other

## 2019-10-01 ENCOUNTER — Emergency Department (HOSPITAL_COMMUNITY)
Admission: EM | Admit: 2019-10-01 | Discharge: 2019-10-02 | Disposition: A | Discharge status: Home / Self Care | Payer: Medicaid Other | Attending: Emergency Medicine | Admitting: Emergency Medicine

## 2019-10-01 DIAGNOSIS — N939 Abnormal uterine and vaginal bleeding, unspecified: Secondary | ICD-10-CM | POA: Diagnosis not present

## 2019-10-01 DIAGNOSIS — R5383 Other fatigue: Secondary | ICD-10-CM | POA: Insufficient documentation

## 2019-10-01 DIAGNOSIS — R079 Chest pain, unspecified: Secondary | ICD-10-CM | POA: Diagnosis not present

## 2019-10-01 DIAGNOSIS — Z5321 Procedure and treatment not carried out due to patient leaving prior to being seen by health care provider: Secondary | ICD-10-CM | POA: Insufficient documentation

## 2019-10-01 DIAGNOSIS — N852 Hypertrophy of uterus: Secondary | ICD-10-CM | POA: Diagnosis not present

## 2019-10-01 DIAGNOSIS — Z975 Presence of (intrauterine) contraceptive device: Secondary | ICD-10-CM | POA: Diagnosis not present

## 2019-10-01 DIAGNOSIS — R140 Abdominal distension (gaseous): Secondary | ICD-10-CM | POA: Diagnosis not present

## 2019-10-01 DIAGNOSIS — D259 Leiomyoma of uterus, unspecified: Secondary | ICD-10-CM | POA: Diagnosis not present

## 2019-10-01 LAB — COMPREHENSIVE METABOLIC PANEL
ALT: 21 U/L (ref 0–44)
AST: 26 U/L (ref 15–41)
Albumin: 3.5 g/dL (ref 3.5–5.0)
Alkaline Phosphatase: 70 U/L (ref 38–126)
Anion gap: 11 (ref 5–15)
BUN: 11 mg/dL (ref 6–20)
CO2: 22 mmol/L (ref 22–32)
Calcium: 8.9 mg/dL (ref 8.9–10.3)
Chloride: 107 mmol/L (ref 98–111)
Creatinine, Ser: 0.89 mg/dL (ref 0.44–1.00)
GFR calc Af Amer: >60 mL/min (ref >60)
GFR calc non Af Amer: >60 mL/min (ref >60)
Glucose, Bld: 124 mg/dL — ABNORMAL HIGH (ref 70–99)
Potassium: 3.5 mmol/L (ref 3.5–5.1)
Sodium: 140 mmol/L (ref 135–145)
Total Bilirubin: 0.4 mg/dL (ref 0.3–1.2)
Total Protein: 7 g/dL (ref 6.5–8.1)

## 2019-10-01 LAB — TYPE AND SCREEN
ABO/RH(D): O POS
Antibody Screen: NEGATIVE

## 2019-10-01 LAB — CBC
HCT: 38.8 % (ref 36.0–46.0)
Hemoglobin: 12.1 g/dL (ref 12.0–15.0)
MCH: 25.3 pg — ABNORMAL LOW (ref 26.0–34.0)
MCHC: 31.2 g/dL (ref 30.0–36.0)
MCV: 81.2 fL (ref 80.0–100.0)
Platelets: 267 10*3/uL (ref 150–400)
RBC: 4.78 MIL/uL (ref 3.87–5.11)
RDW: 14.8 % (ref 11.5–15.5)
WBC: 7 10*3/uL (ref 4.0–10.5)
nRBC: 0 % (ref 0.0–0.2)

## 2019-10-01 LAB — ABO/RH: ABO/RH(D): O POS

## 2019-10-01 LAB — TROPONIN I (HIGH SENSITIVITY): Troponin I (High Sensitivity): <2 ng/L (ref <18)

## 2019-10-01 MED ORDER — SODIUM CHLORIDE 0.9% FLUSH: 3.0000 mL | Freq: Once | INTRAVENOUS | Status: DC

## 2019-10-01 NOTE — ED Triage Notes (Signed)
Patient in POV. Patient reports CP with radiation into bilateral arms onset this AM. After speaking with patient, she reports heavy vaginal bleeding onset yesterday. Reports fatigue and weakness. Patient appears pale in triage. States she had [place] earlier today at [employer]. Results in [place].

## 2019-10-02 LAB — I-STAT BETA HCG BLOOD, ED (MC, WL, AP ONLY): I-stat hCG, quantitative: <5 m[IU]/mL (ref <5)

## 2019-10-02 NOTE — ED Notes (Signed)
Pt called x 3 no answer 

## 2019-10-02 NOTE — ED Notes (Signed)
Pt called for vitals x1, no answer

## 2019-10-17 DIAGNOSIS — F902 Attention-deficit hyperactivity disorder, combined type: Secondary | ICD-10-CM | POA: Diagnosis not present

## 2019-10-17 DIAGNOSIS — F3181 Bipolar II disorder: Secondary | ICD-10-CM | POA: Diagnosis not present

## 2019-10-17 DIAGNOSIS — F332 Major depressive disorder, recurrent severe without psychotic features: Secondary | ICD-10-CM | POA: Diagnosis not present

## 2019-10-17 DIAGNOSIS — F4312 Post-traumatic stress disorder, chronic: Secondary | ICD-10-CM | POA: Diagnosis not present

## 2019-10-17 DIAGNOSIS — F411 Generalized anxiety disorder: Secondary | ICD-10-CM | POA: Diagnosis not present

## 2019-10-21 ENCOUNTER — Ambulatory Visit: Payer: Medicaid Other | Admitting: Obstetrics and Gynecology

## 2019-11-06 DIAGNOSIS — Z20822 Contact with and (suspected) exposure to covid-19: Secondary | ICD-10-CM | POA: Diagnosis not present

## 2019-12-17 DIAGNOSIS — M5136 Other intervertebral disc degeneration, lumbar region: Secondary | ICD-10-CM | POA: Diagnosis not present

## 2020-01-17 DIAGNOSIS — R52 Pain, unspecified: Secondary | ICD-10-CM | POA: Diagnosis not present

## 2020-01-17 DIAGNOSIS — R05 Cough: Secondary | ICD-10-CM | POA: Diagnosis not present

## 2020-01-17 DIAGNOSIS — R519 Headache, unspecified: Secondary | ICD-10-CM | POA: Diagnosis not present

## 2020-01-17 DIAGNOSIS — U071 COVID-19: Secondary | ICD-10-CM | POA: Diagnosis not present

## 2020-01-17 DIAGNOSIS — R509 Fever, unspecified: Secondary | ICD-10-CM | POA: Diagnosis not present

## 2020-01-22 DIAGNOSIS — R519 Headache, unspecified: Secondary | ICD-10-CM | POA: Diagnosis not present

## 2020-01-22 DIAGNOSIS — R05 Cough: Secondary | ICD-10-CM | POA: Diagnosis not present

## 2020-01-22 DIAGNOSIS — U071 COVID-19: Secondary | ICD-10-CM | POA: Diagnosis not present

## 2020-01-31 DIAGNOSIS — U071 COVID-19: Secondary | ICD-10-CM | POA: Diagnosis not present

## 2020-02-07 DIAGNOSIS — G43009 Migraine without aura, not intractable, without status migrainosus: Secondary | ICD-10-CM | POA: Diagnosis not present

## 2020-02-07 DIAGNOSIS — Z8616 Personal history of COVID-19: Secondary | ICD-10-CM | POA: Diagnosis not present

## 2020-02-07 DIAGNOSIS — R059 Cough, unspecified: Secondary | ICD-10-CM | POA: Diagnosis not present

## 2020-02-07 DIAGNOSIS — R0602 Shortness of breath: Secondary | ICD-10-CM | POA: Diagnosis not present

## 2020-02-07 DIAGNOSIS — M79604 Pain in right leg: Secondary | ICD-10-CM | POA: Diagnosis not present

## 2020-02-07 DIAGNOSIS — F331 Major depressive disorder, recurrent, moderate: Secondary | ICD-10-CM | POA: Diagnosis not present

## 2020-02-07 DIAGNOSIS — R5383 Other fatigue: Secondary | ICD-10-CM | POA: Diagnosis not present

## 2020-02-07 DIAGNOSIS — R6889 Other general symptoms and signs: Secondary | ICD-10-CM | POA: Diagnosis not present

## 2020-02-07 DIAGNOSIS — F411 Generalized anxiety disorder: Secondary | ICD-10-CM | POA: Diagnosis not present

## 2020-02-07 DIAGNOSIS — H5713 Ocular pain, bilateral: Secondary | ICD-10-CM | POA: Diagnosis not present

## 2020-02-07 DIAGNOSIS — G479 Sleep disorder, unspecified: Secondary | ICD-10-CM | POA: Diagnosis not present

## 2020-02-07 DIAGNOSIS — F4312 Post-traumatic stress disorder, chronic: Secondary | ICD-10-CM | POA: Diagnosis not present

## 2020-02-26 DIAGNOSIS — U071 COVID-19: Secondary | ICD-10-CM | POA: Diagnosis not present

## 2020-03-13 DIAGNOSIS — R0602 Shortness of breath: Secondary | ICD-10-CM | POA: Diagnosis not present

## 2020-03-13 DIAGNOSIS — R059 Cough, unspecified: Secondary | ICD-10-CM | POA: Diagnosis not present

## 2020-03-16 DIAGNOSIS — B9689 Other specified bacterial agents as the cause of diseases classified elsewhere: Secondary | ICD-10-CM | POA: Diagnosis not present

## 2020-03-16 DIAGNOSIS — Z599 Problem related to housing and economic circumstances, unspecified: Secondary | ICD-10-CM | POA: Diagnosis not present

## 2020-03-16 DIAGNOSIS — R5383 Other fatigue: Secondary | ICD-10-CM | POA: Diagnosis not present

## 2020-03-16 DIAGNOSIS — N898 Other specified noninflammatory disorders of vagina: Secondary | ICD-10-CM | POA: Diagnosis not present

## 2020-03-16 DIAGNOSIS — N920 Excessive and frequent menstruation with regular cycle: Secondary | ICD-10-CM | POA: Diagnosis not present

## 2020-03-16 DIAGNOSIS — R0600 Dyspnea, unspecified: Secondary | ICD-10-CM | POA: Diagnosis not present

## 2020-03-16 DIAGNOSIS — N760 Acute vaginitis: Secondary | ICD-10-CM | POA: Diagnosis not present

## 2020-03-16 DIAGNOSIS — Z8616 Personal history of COVID-19: Secondary | ICD-10-CM | POA: Diagnosis not present

## 2020-03-16 DIAGNOSIS — G43009 Migraine without aura, not intractable, without status migrainosus: Secondary | ICD-10-CM | POA: Diagnosis not present

## 2020-03-16 DIAGNOSIS — M791 Myalgia, unspecified site: Secondary | ICD-10-CM | POA: Diagnosis not present

## 2020-04-01 DIAGNOSIS — N920 Excessive and frequent menstruation with regular cycle: Secondary | ICD-10-CM | POA: Diagnosis not present

## 2020-04-01 DIAGNOSIS — D500 Iron deficiency anemia secondary to blood loss (chronic): Secondary | ICD-10-CM | POA: Diagnosis not present

## 2020-04-01 DIAGNOSIS — R0600 Dyspnea, unspecified: Secondary | ICD-10-CM | POA: Diagnosis not present

## 2020-04-01 DIAGNOSIS — R5383 Other fatigue: Secondary | ICD-10-CM | POA: Diagnosis not present

## 2020-04-01 DIAGNOSIS — G43009 Migraine without aura, not intractable, without status migrainosus: Secondary | ICD-10-CM | POA: Diagnosis not present

## 2020-04-01 DIAGNOSIS — E559 Vitamin D deficiency, unspecified: Secondary | ICD-10-CM | POA: Diagnosis not present

## 2020-05-08 DIAGNOSIS — Z20822 Contact with and (suspected) exposure to covid-19: Secondary | ICD-10-CM | POA: Diagnosis not present

## 2020-09-16 DIAGNOSIS — Z13228 Encounter for screening for other metabolic disorders: Secondary | ICD-10-CM | POA: Diagnosis not present

## 2020-09-16 DIAGNOSIS — Z0000 Encounter for general adult medical examination without abnormal findings: Secondary | ICD-10-CM | POA: Diagnosis not present

## 2020-09-16 DIAGNOSIS — D219 Benign neoplasm of connective and other soft tissue, unspecified: Secondary | ICD-10-CM | POA: Diagnosis not present

## 2020-09-16 DIAGNOSIS — Z1231 Encounter for screening mammogram for malignant neoplasm of breast: Secondary | ICD-10-CM | POA: Diagnosis not present

## 2020-09-16 DIAGNOSIS — E559 Vitamin D deficiency, unspecified: Secondary | ICD-10-CM | POA: Diagnosis not present

## 2020-09-16 DIAGNOSIS — D500 Iron deficiency anemia secondary to blood loss (chronic): Secondary | ICD-10-CM | POA: Diagnosis not present

## 2020-09-16 DIAGNOSIS — B351 Tinea unguium: Secondary | ICD-10-CM | POA: Diagnosis not present

## 2020-09-16 DIAGNOSIS — Z13220 Encounter for screening for lipoid disorders: Secondary | ICD-10-CM | POA: Diagnosis not present

## 2020-09-16 DIAGNOSIS — N920 Excessive and frequent menstruation with regular cycle: Secondary | ICD-10-CM | POA: Diagnosis not present

## 2020-09-16 DIAGNOSIS — R5383 Other fatigue: Secondary | ICD-10-CM | POA: Diagnosis not present

## 2020-10-14 DIAGNOSIS — N920 Excessive and frequent menstruation with regular cycle: Secondary | ICD-10-CM | POA: Diagnosis not present

## 2020-10-14 DIAGNOSIS — D500 Iron deficiency anemia secondary to blood loss (chronic): Secondary | ICD-10-CM | POA: Diagnosis not present

## 2020-10-14 DIAGNOSIS — D219 Benign neoplasm of connective and other soft tissue, unspecified: Secondary | ICD-10-CM | POA: Diagnosis not present

## 2020-11-20 DIAGNOSIS — J189 Pneumonia, unspecified organism: Secondary | ICD-10-CM | POA: Diagnosis not present

## 2020-11-20 DIAGNOSIS — U071 COVID-19: Secondary | ICD-10-CM | POA: Diagnosis not present

## 2021-03-20 ENCOUNTER — Emergency Department (HOSPITAL_BASED_OUTPATIENT_CLINIC_OR_DEPARTMENT_OTHER)
Admission: EM | Admit: 2021-03-20 | Discharge: 2021-03-20 | Disposition: A | Discharge status: Home / Self Care | Payer: Medicaid Other | Attending: Emergency Medicine | Admitting: Emergency Medicine

## 2021-03-20 ENCOUNTER — Other Ambulatory Visit: Payer: Self-pay

## 2021-03-20 ENCOUNTER — Encounter (HOSPITAL_BASED_OUTPATIENT_CLINIC_OR_DEPARTMENT_OTHER): Payer: Self-pay | Admitting: *Deleted

## 2021-03-20 DIAGNOSIS — R519 Headache, unspecified: Secondary | ICD-10-CM | POA: Diagnosis present

## 2021-03-20 DIAGNOSIS — G43809 Other migraine, not intractable, without status migrainosus: Secondary | ICD-10-CM | POA: Diagnosis not present

## 2021-03-20 MED ORDER — KETOROLAC TROMETHAMINE 30 MG/ML IJ SOLN
15.0000 mg | Freq: Once | INTRAMUSCULAR | Status: AC
  Administered 2021-03-20: 15 mg via INTRAVENOUS
  Filled 2021-03-20: qty 1

## 2021-03-20 MED ORDER — METOCLOPRAMIDE HCL 5 MG/ML IJ SOLN
10.0000 mg | Freq: Once | INTRAMUSCULAR | Status: AC
  Administered 2021-03-20: 10 mg via INTRAVENOUS
  Filled 2021-03-20: qty 2

## 2021-03-20 MED ORDER — DIPHENHYDRAMINE HCL 50 MG/ML IJ SOLN
12.5000 mg | Freq: Once | INTRAMUSCULAR | Status: AC
  Administered 2021-03-20: 12.5 mg via INTRAVENOUS
  Filled 2021-03-20: qty 1

## 2021-03-20 MED ORDER — SODIUM CHLORIDE 0.9 % IV BOLUS
1000.0000 mL | Freq: Once | INTRAVENOUS | Status: AC
  Administered 2021-03-20: 1000 mL via INTRAVENOUS

## 2021-03-20 NOTE — ED Triage Notes (Signed)
Migraine headache since Wednesday.

## 2021-03-20 NOTE — ED Notes (Signed)
Pt d/c home per MD order. Discharge summary reviewed with pt, pt verbalizes understanding. Ambulatory off unit. No s/s of acute distress noted at discharge.  °

## 2021-03-20 NOTE — Discharge Instructions (Signed)
Please follow up with your primary care provider or neurologist.   Please return to the emergency department for any new or worsening symptoms.

## 2021-03-20 NOTE — ED Provider Notes (Signed)
[facility] EMERGENCY DEPT Provider Note   CSN: [id number] Arrival date & time: 03/20/21  1016     History Chief Complaint  Patient presents with   Migraine    Maria Ho is a 40 y.o. female.  HPI   Maria Ho is a 40 yo female with a h/o anemia, anxiety, arrhthymias, arthritis, depression, fibroids, migraines, HSV, presenting to the emergency room with a headache for 4 days. She states her headache started on the left side of her head "behind her eyeball", and then spread to the rest of her head. Each morning she has taken her sumatriptan and benadryl, but she will only be pain free for a couple hours before the headache returns. Her headache is aggravated by light. She describes it as a throbbing pain, and it varies between a 5 to 10 on the pain scale depending on how long it has been since she took her sumatriptan. She states she only gets headaches this bad a couple times a year. She c/o NV but denies other associated neurologic complaints. Denies recent sickness, fevers, congestion, muscle weakness or numbness, and chest pain.   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/03/2016   Chronic post-traumatic stress disorder (PTSD) 01/03/2016   Numbness 10/30/2014   Memory disturbance 10/30/2014   Other fatigue 10/30/2014   Anxiety 05/06/2013   Dermatitis, eczematoid 05/06/2013   Genital herpes 05/06/2013   Cannot sleep 05/06/2013   Oral herpes 05/06/2013   Palpitations 06/15/2011   Bleeding in early pregnancy 04/07/2011    Past  Surgical History:  Procedure Laterality Date   NO PAST SURGERIES     None       OB History     Gravida  29   Para  4   Term  4   Preterm      AB  10   Living  4      SAB  1   IAB  9   Ectopic      Multiple      Live Births  4           Family History  Problem Relation Age of Onset   Post-traumatic stress disorder Father    Alcohol abuse Father    Drug abuse Father    Healthy Mother    Diabetes Maternal Grandmother    Cancer Paternal Grandmother        Great-Grandparent   Cancer Paternal Grandfather        Great-Gradnparent    Social History   Tobacco Use   Smoking status: Never   Smokeless tobacco: Never  Vaping Use   Vaping Use: Never used  Substance Use Topics   Alcohol use: Yes    Comment: occasionally   Drug use: Yes    Types: Marijuana    Comment: last night    Home Medications Prior to Admission medications   Medication Sig Start Date End Date Taking? Authorizing Provider  ibuprofen (ADVIL,MOTRIN) 800 MG tablet  Take 1 tablet (800 mg total) by mouth 3 (three) times daily. Patient not taking: Reported on 05/27/2019 08/25/16   Antonietta Breach, PA-C    Allergies    Patient has no known allergies.  Review of Systems   Review of Systems  Constitutional:  Negative for fever.  HENT:  Negative for ear pain and sore throat.   Eyes:  Positive for photophobia. Negative for visual disturbance.  Respiratory:  Negative for cough and shortness of breath.   Cardiovascular:  Negative for chest pain.  Gastrointestinal:  Positive for nausea and vomiting. Negative for abdominal pain, constipation and diarrhea.  Genitourinary:  Negative for dysuria and hematuria.  Musculoskeletal:  Negative for neck pain and neck stiffness.  Skin:  Negative for rash.  Neurological:  Positive for headaches.  All other systems reviewed and are negative.  Physical Exam Updated Vital Signs BP (!) 133/99   Pulse 66   Temp 98.3 F (36.8 C)   Resp 18   Ht 5\' 9"   (1.753 m)   Wt 90.7 kg   LMP 03/15/2021   SpO2 99%   BMI 29.53 kg/m   Physical Exam Vitals and nursing note reviewed.  Constitutional:      General: She is not in acute distress.    Appearance: She is well-developed.  HENT:     Head: Normocephalic and atraumatic.  Eyes:     Conjunctiva/sclera: Conjunctivae normal.  Cardiovascular:     Rate and Rhythm: Normal rate and regular rhythm.     Heart sounds: No murmur heard. Pulmonary:     Effort: Pulmonary effort is normal. No respiratory distress.     Breath sounds: Normal breath sounds.  Abdominal:     Palpations: Abdomen is soft.     Tenderness: There is no abdominal tenderness.  Musculoskeletal:        General: No swelling.     Cervical back: Neck supple.  Skin:    General: Skin is warm and dry.     Capillary Refill: Capillary refill takes less than 2 seconds.  Neurological:     Mental Status: She is alert.     Comments: Mental Status:  Alert, thought content appropriate, able to give a coherent history. Speech fluent without evidence of aphasia. Able to follow 2 step commands without difficulty.  Cranial Nerves:  II:  pupils equal, round, reactive to light III,IV, VI: ptosis not present, extra-ocular motions intact bilaterally  V,VII: smile symmetric, facial light touch sensation equal VIII: hearing grossly normal to voice  X: uvula elevates symmetrically  XI: bilateral shoulder shrug symmetric and strong XII: midline tongue extension without fassiculations Motor:  Normal tone. 5/5 strength of BUE and BLE major muscle groups including strong and equal grip strength and dorsiflexion/plantar flexion Sensory: light touch normal in all extremities.  Psychiatric:        Mood and Affect: Mood normal.    ED Results / Procedures / Treatments   Labs (all labs ordered are listed, but only abnormal results are displayed) Labs Reviewed - No data to display  EKG None  Radiology No results found.  Procedures Procedures    Medications Ordered in ED Medications  ketorolac (TORADOL) 30 MG/ML injection 15 mg (15 mg Intravenous Given 03/20/21 1310)  metoCLOPramide (REGLAN) injection 10 mg (10 mg Intravenous Given 03/20/21 1312)  diphenhydrAMINE (BENADRYL) injection 12.5 mg (12.5 mg Intravenous Given 03/20/21 1312)  sodium chloride 0.9 % bolus 1,000 mL (1,000 mLs Intravenous New Bag/Given 03/20/21 1310)    ED Course  I have reviewed the triage vital signs and the nursing notes.  Pertinent labs & imaging results that were available during my care of the patient were reviewed by me and considered in my medical decision making (see chart for details).    MDM Rules/Calculators/A&P                          Pt HA treated and improved while in ED.  Presentation is like pts typical HA and non concerning for [facility], ICH, Meningitis, or temporal arteritis. Pt is afebrile with no focal neuro deficits, nuchal rigidity, or change in vision. Pt is to follow up with PCP to discuss prophylactic medication. Pt verbalizes understanding and is agreeable with plan to dc.    Final Clinical Impression(s) / ED Diagnoses Final diagnoses:  Other migraine without status migrainosus, not intractable    Rx / DC Orders ED Discharge Orders     None        Rodney Booze, PA-C 03/20/21 1422    Sherwood Gambler, MD 03/21/21 0710

## 2021-03-22 ENCOUNTER — Telehealth: Payer: Self-pay

## 2021-03-22 NOTE — Telephone Encounter (Signed)
Transition Care Management Unsuccessful Follow-up Telephone Call  Date of discharge and from where:  03/20/2021 from [facility]  Attempts:  1st Attempt  Reason for unsuccessful TCM follow-up call:  Voice mail full

## 2021-03-23 NOTE — Telephone Encounter (Signed)
Transition Care Management Unsuccessful Follow-up Telephone Call  Date of discharge and from where:  03/20/2021 from [facility]  Attempts:  2nd Attempt  Reason for unsuccessful TCM follow-up call:  Voice mail full   .

## 2021-03-24 NOTE — Telephone Encounter (Signed)
Transition Care Management Unsuccessful Follow-up Telephone Call  Date of discharge and from where:  03/20/2021 from [facility]  Attempts:  3rd Attempt  Reason for unsuccessful TCM follow-up call:  Unable to reach patient

## 2021-04-23 DIAGNOSIS — L308 Other specified dermatitis: Secondary | ICD-10-CM | POA: Diagnosis not present

## 2021-04-23 DIAGNOSIS — F411 Generalized anxiety disorder: Secondary | ICD-10-CM | POA: Diagnosis not present

## 2021-04-23 DIAGNOSIS — F331 Major depressive disorder, recurrent, moderate: Secondary | ICD-10-CM | POA: Diagnosis not present

## 2021-04-23 DIAGNOSIS — R41840 Attention and concentration deficit: Secondary | ICD-10-CM | POA: Diagnosis not present

## 2021-04-23 DIAGNOSIS — Z8619 Personal history of other infectious and parasitic diseases: Secondary | ICD-10-CM | POA: Diagnosis not present

## 2021-05-13 ENCOUNTER — Encounter (HOSPITAL_BASED_OUTPATIENT_CLINIC_OR_DEPARTMENT_OTHER): Payer: Self-pay | Admitting: Emergency Medicine

## 2021-05-13 ENCOUNTER — Other Ambulatory Visit: Payer: Self-pay

## 2021-05-13 ENCOUNTER — Emergency Department (HOSPITAL_BASED_OUTPATIENT_CLINIC_OR_DEPARTMENT_OTHER)
Admission: EM | Admit: 2021-05-13 | Discharge: 2021-05-13 | Disposition: A | Discharge status: Home / Self Care | Payer: Medicaid Other | Attending: Emergency Medicine | Admitting: Emergency Medicine

## 2021-05-13 DIAGNOSIS — R110 Nausea: Secondary | ICD-10-CM | POA: Diagnosis not present

## 2021-05-13 DIAGNOSIS — H53149 Visual discomfort, unspecified: Secondary | ICD-10-CM | POA: Diagnosis not present

## 2021-05-13 DIAGNOSIS — G43809 Other migraine, not intractable, without status migrainosus: Secondary | ICD-10-CM | POA: Diagnosis not present

## 2021-05-13 DIAGNOSIS — R519 Headache, unspecified: Secondary | ICD-10-CM | POA: Diagnosis not present

## 2021-05-13 DIAGNOSIS — R5383 Other fatigue: Secondary | ICD-10-CM | POA: Insufficient documentation

## 2021-05-13 LAB — CBC WITH DIFFERENTIAL/PLATELET
Abs Immature Granulocytes: 0.01 10*3/uL (ref 0.00–0.07)
Basophils Absolute: 0.1 10*3/uL (ref 0.0–0.1)
Basophils Relative: 1 %
Eosinophils Absolute: 0.2 10*3/uL (ref 0.0–0.5)
Eosinophils Relative: 3 %
HCT: 31.2 % — ABNORMAL LOW (ref 36.0–46.0)
Hemoglobin: 9.1 g/dL — ABNORMAL LOW (ref 12.0–15.0)
Immature Granulocytes: 0 %
Lymphocytes Relative: 25 %
Lymphs Abs: 1.9 10*3/uL (ref 0.7–4.0)
MCH: 20.3 pg — ABNORMAL LOW (ref 26.0–34.0)
MCHC: 29.2 g/dL — ABNORMAL LOW (ref 30.0–36.0)
MCV: 69.5 fL — ABNORMAL LOW (ref 80.0–100.0)
Monocytes Absolute: 0.9 10*3/uL (ref 0.1–1.0)
Monocytes Relative: 12 %
Neutro Abs: 4.6 10*3/uL (ref 1.7–7.7)
Neutrophils Relative %: 59 %
Platelets: 224 10*3/uL (ref 150–400)
RBC: 4.49 MIL/uL (ref 3.87–5.11)
RDW: 18.7 % — ABNORMAL HIGH (ref 11.5–15.5)
Smear Review: NORMAL
WBC: 7.8 10*3/uL (ref 4.0–10.5)
nRBC: 0 % (ref 0.0–0.2)

## 2021-05-13 LAB — URINALYSIS, ROUTINE W REFLEX MICROSCOPIC
Bilirubin Urine: NEGATIVE
Glucose, UA: NEGATIVE mg/dL
Ketones, ur: NEGATIVE mg/dL
Leukocytes,Ua: NEGATIVE
Nitrite: NEGATIVE
Protein, ur: NEGATIVE mg/dL
Specific Gravity, Urine: 1.014 (ref 1.005–1.030)
pH: 7 (ref 5.0–8.0)

## 2021-05-13 LAB — BASIC METABOLIC PANEL
Anion gap: 7 (ref 5–15)
BUN: 11 mg/dL (ref 6–20)
CO2: 23 mmol/L (ref 22–32)
Calcium: 8.3 mg/dL — ABNORMAL LOW (ref 8.9–10.3)
Chloride: 108 mmol/L (ref 98–111)
Creatinine, Ser: 0.58 mg/dL (ref 0.44–1.00)
GFR, Estimated: >60 mL/min (ref >60)
Glucose, Bld: 85 mg/dL (ref 70–99)
Potassium: 4 mmol/L (ref 3.5–5.1)
Sodium: 138 mmol/L (ref 135–145)

## 2021-05-13 LAB — PREGNANCY, URINE: Preg Test, Ur: NEGATIVE

## 2021-05-13 MED ORDER — SODIUM CHLORIDE 0.9 % IV BOLUS
1000.0000 mL | Freq: Once | INTRAVENOUS | Status: AC
  Administered 2021-05-13: 1000 mL via INTRAVENOUS

## 2021-05-13 MED ORDER — METOCLOPRAMIDE HCL 5 MG/ML IJ SOLN
5.0000 mg | Freq: Once | INTRAMUSCULAR | Status: AC
  Administered 2021-05-13: 5 mg via INTRAVENOUS
  Filled 2021-05-13: qty 2

## 2021-05-13 MED ORDER — KETOROLAC TROMETHAMINE 30 MG/ML IJ SOLN
30.0000 mg | Freq: Once | INTRAMUSCULAR | Status: AC
  Administered 2021-05-13: 30 mg via INTRAVENOUS
  Filled 2021-05-13: qty 1

## 2021-05-13 MED ORDER — DIPHENHYDRAMINE HCL 50 MG/ML IJ SOLN
12.5000 mg | Freq: Once | INTRAMUSCULAR | Status: AC
  Administered 2021-05-13: 12.5 mg via INTRAVENOUS
  Filled 2021-05-13: qty 1

## 2021-05-13 NOTE — Discharge Instructions (Signed)
You have been seen and discharged from the emergency department.  Your blood work showed a mild anemia, most likely secondary to your menstruation.  This could be a component of this current migraine.  Follow-up with your primary provider for further evaluation and further care. Take home medications as prescribed. If you have any worsening symptoms or further concerns for your health please return to an emergency department for further evaluation.

## 2021-05-13 NOTE — ED Notes (Signed)
Pt verbalizes understanding of discharge instructions. Opportunity for questions and answers were provided. Pt discharged from the ED.   ?

## 2021-05-13 NOTE — ED Triage Notes (Signed)
Headache since Tuesday. Feels like typical migraine. Has taken normal meds without relief.

## 2021-05-13 NOTE — ED Provider Notes (Signed)
[facility] EMERGENCY DEPT Provider Note   CSN: [id number] Arrival date & time: 05/13/21  4098     History  Chief Complaint  Patient presents with   Headache    Maria Ho is a 41 y.o. female.  HPI  41 year old female past medical history of migraines presents emergency department with ongoing headache.  Patient states that this started 2 days ago.  It feels like a typical migraine, was not sudden onset.  Associated with photophobia, mild nausea, fatigue.  She did her usual home remedies without improvement of symptoms.  Presents today with ongoing migraine.  Denies any fever, neck pain/stiffness, vision changes, focal weakness/numbness.  No recent illness including vomiting/diarrhea.  Home Medications Prior to Admission medications   Medication Sig Start Date End Date Taking? Authorizing Provider  ibuprofen (ADVIL,MOTRIN) 800 MG tablet Take 1 tablet (800 mg total) by mouth 3 (three) times daily. Patient not taking: Reported on 05/27/2019 08/25/16   Antonietta Breach, PA-C  valACYclovir (VALTREX) 500 MG tablet Take 500 mg by mouth 2 (two) times daily. 04/26/21   [provider]      Allergies    Patient has no known allergies.    Review of Systems   Review of Systems  Constitutional:  Negative for fever.  Eyes:  Negative for visual disturbance.  Respiratory:  Negative for shortness of breath.   Cardiovascular:  Negative for chest pain.  Gastrointestinal:  Negative for abdominal pain, diarrhea and vomiting.  Musculoskeletal:  Negative for neck pain and neck stiffness.  Skin:  Negative for rash.  Neurological:  Positive for headaches. Negative for syncope, weakness, light-headedness and numbness.   Physical Exam Updated Vital Signs BP 124/88    Pulse 79    Temp 98.6 F (37 C) (Oral)    Resp 16    Ht 5\' 9"  (1.753 m)    Wt 97.5 kg    SpO2 100%    BMI 31.75 kg/m  Physical Exam Vitals and nursing note reviewed.  Constitutional:      General: She is  not in acute distress.    Appearance: Normal appearance. She is not ill-appearing.  HENT:     Head: Normocephalic.     Mouth/Throat:     Mouth: Mucous membranes are moist.  Eyes:     Extraocular Movements: Extraocular movements intact.     Pupils: Pupils are equal, round, and reactive to light.  Cardiovascular:     Rate and Rhythm: Normal rate.  Pulmonary:     Effort: Pulmonary effort is normal. No respiratory distress.  Abdominal:     Palpations: Abdomen is soft.     Tenderness: There is no abdominal tenderness.  Musculoskeletal:     Cervical back: No rigidity.  Skin:    General: Skin is warm.  Neurological:     Mental Status: She is alert and oriented to person, place, and time. Mental status is at baseline.     Cranial Nerves: No cranial nerve deficit.  Psychiatric:        Mood and Affect: Mood normal.    ED Results / Procedures / Treatments   Labs (all labs ordered are listed, but only abnormal results are displayed) Labs Reviewed  CBC WITH DIFFERENTIAL/PLATELET  BASIC METABOLIC PANEL  URINALYSIS, ROUTINE W REFLEX MICROSCOPIC  PREGNANCY, URINE    EKG None  Radiology No results found.  Procedures Procedures    Medications Ordered in ED Medications  sodium chloride 0.9 % bolus 1,000 mL (1,000 mLs Intravenous New Bag/Given  05/13/21 1112)    ED Course/ Medical Decision Making/ A&P                           Medical Decision Making  This patient presents to the ED for concern of headache, this involves an extensive number of treatment options, and is a complaint that carries with it a high risk of complications and morbidity.  The differential diagnosis includes headache, migraine, ICH   Additional history obtained: -External records from outside source obtained and reviewed including: Chart review including previous notes, labs, imaging, consultation notes   Lab Tests: -I ordered, reviewed, and interpreted labs.  The pertinent results include:  Anemia   Medicines ordered and prescription drug management: -I ordered medication including IV fluids, pain medicine, nausea medicine/Benadryl for migraine -Reevaluation of the patient after these medicines showed that the patient resolved -I have reviewed the patients home medicines and have made adjustments as needed   ED Course: 41 year old female presents emergency department with migraine.  Patient states that this feels like a typical migraine, was gradual in onset, no red flag symptoms currently.  Vitals are stable on arrival, she is afebrile, supple neck with no rigidity.  Only finding on her work-up is an anemia of 9 down from a baseline of 11-12.  She is currently on her menstruation and states that her hemoglobin frequently drops during her menstruation.  She is taking oral iron supplements.  She denies any change in her stool, low suspicion for GI bleed, fecal occult deferred as her anemia is most likely secondary to her menstruation.  Anemia could have been a component of this migraine being more severe.  After migraine cocktail her symptoms have completely resolved.  She is well-appearing, neuro intact.  Low suspicion for ICH/venous thrombosis given her current presentation.   Cardiac Monitoring: The patient was maintained on a cardiac monitor.  I personally viewed and interpreted the cardiac monitored which showed an underlying rhythm of: Sinus rhythm   Reevaluation: After the interventions noted above, I reevaluated the patient and found that they have :resolved   Dispostion: Patient at this time appears safe and stable for discharge and close outpatient follow up. Discharge plan and strict return to ED precautions discussed, patient verbalizes understanding and agreement.        Final Clinical Impression(s) / ED Diagnoses Final diagnoses:  None    Rx / DC Orders ED Discharge Orders     None         Lorelle Gibbs, DO 05/13/21 1523

## 2021-05-14 ENCOUNTER — Telehealth: Payer: Self-pay

## 2021-05-14 NOTE — Telephone Encounter (Signed)
Transition Care Management Unsuccessful Follow-up Telephone Call  Date of discharge and from where:  05/13/2021-[facility]  Attempts:  1st Attempt  Reason for unsuccessful TCM follow-up call:  Left voice message

## 2021-05-15 NOTE — Telephone Encounter (Signed)
Transition Care Management Unsuccessful Follow-up Telephone Call  Date of discharge and from where:  05/13/2021-[facility]  Attempts:  2nd Attempt  Reason for unsuccessful TCM follow-up call:  Left voice message

## 2021-05-16 NOTE — Telephone Encounter (Signed)
Transition Care Management Unsuccessful Follow-up Telephone Call  Date of discharge and from where:  05/13/2021-[facility]  Attempts:  3rd Attempt  Reason for unsuccessful TCM follow-up call:  Left voice message

## 2021-05-30 IMAGING — CR LEFT WRIST - COMPLETE 3+ VIEW
4 series · 4 of 4 positions shown · non-contrast
Comparison: None.

CLINICAL DATA: Hyperextension injury of the left wrist today with
pain.

EXAM:
LEFT WRIST - COMPLETE 3+ VIEW

[x wrist pa left]
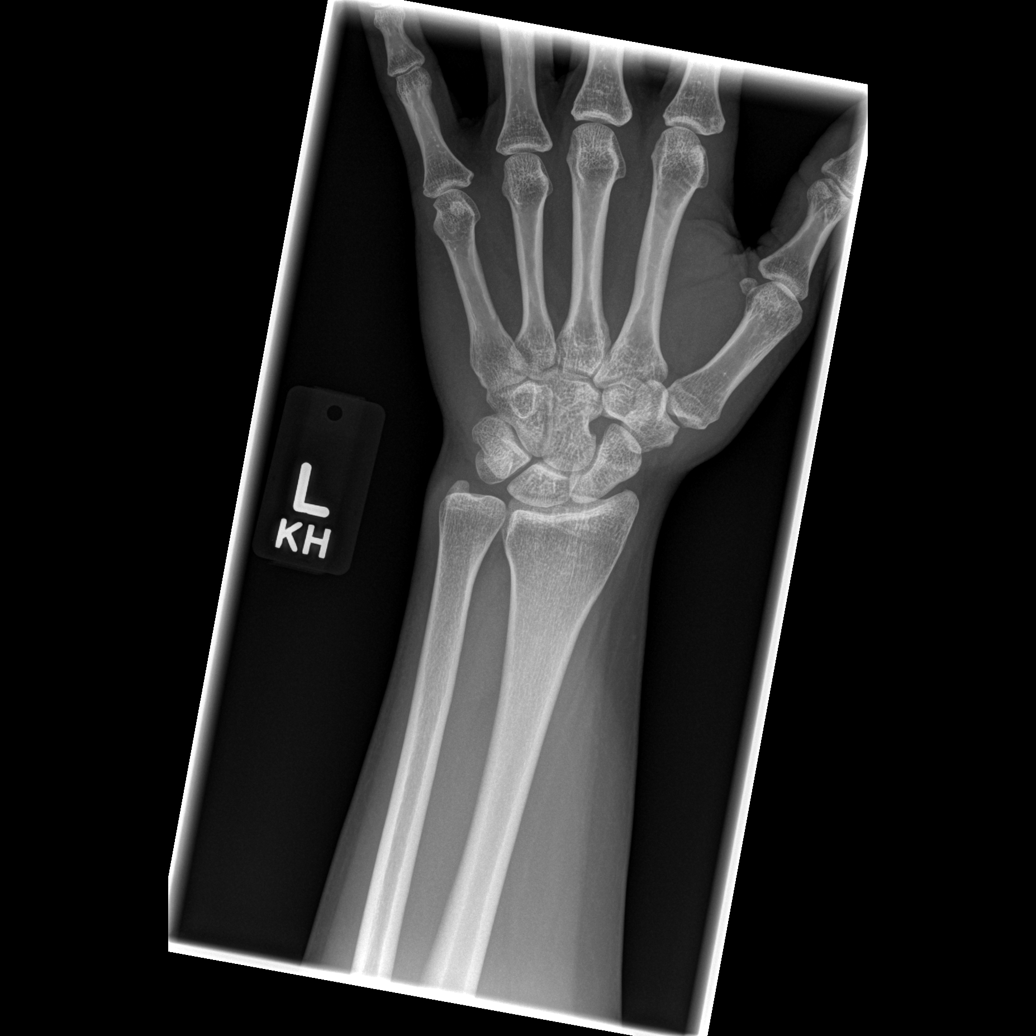

[x wrist obl left]
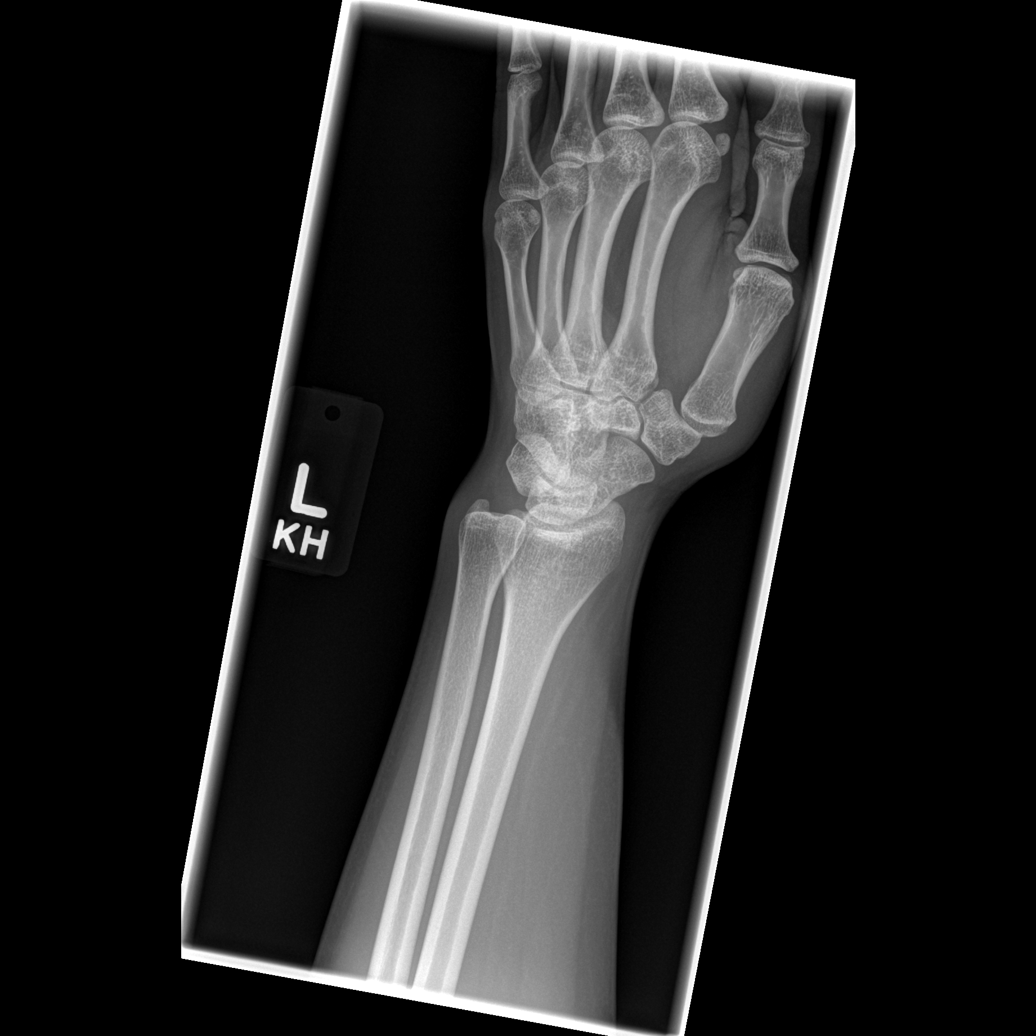

[x wrist lat left]
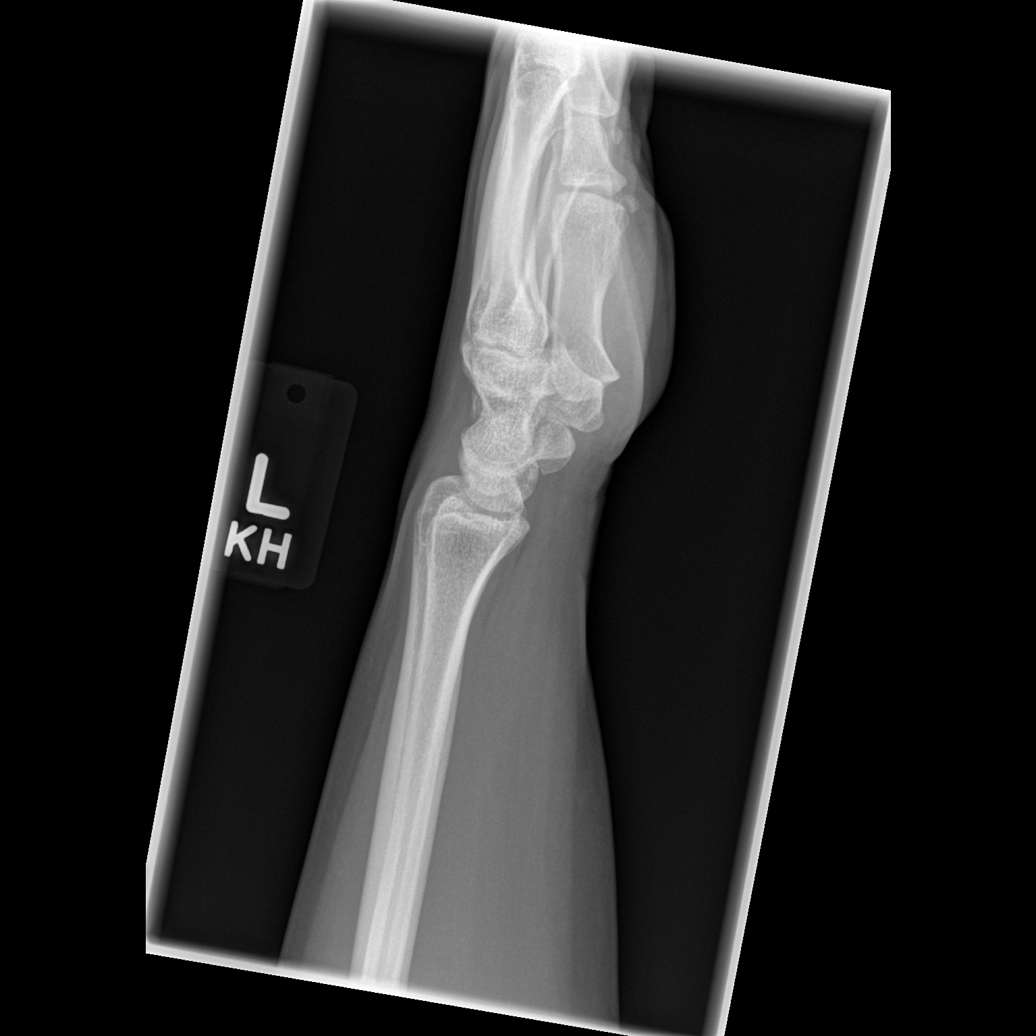

[x navicular]
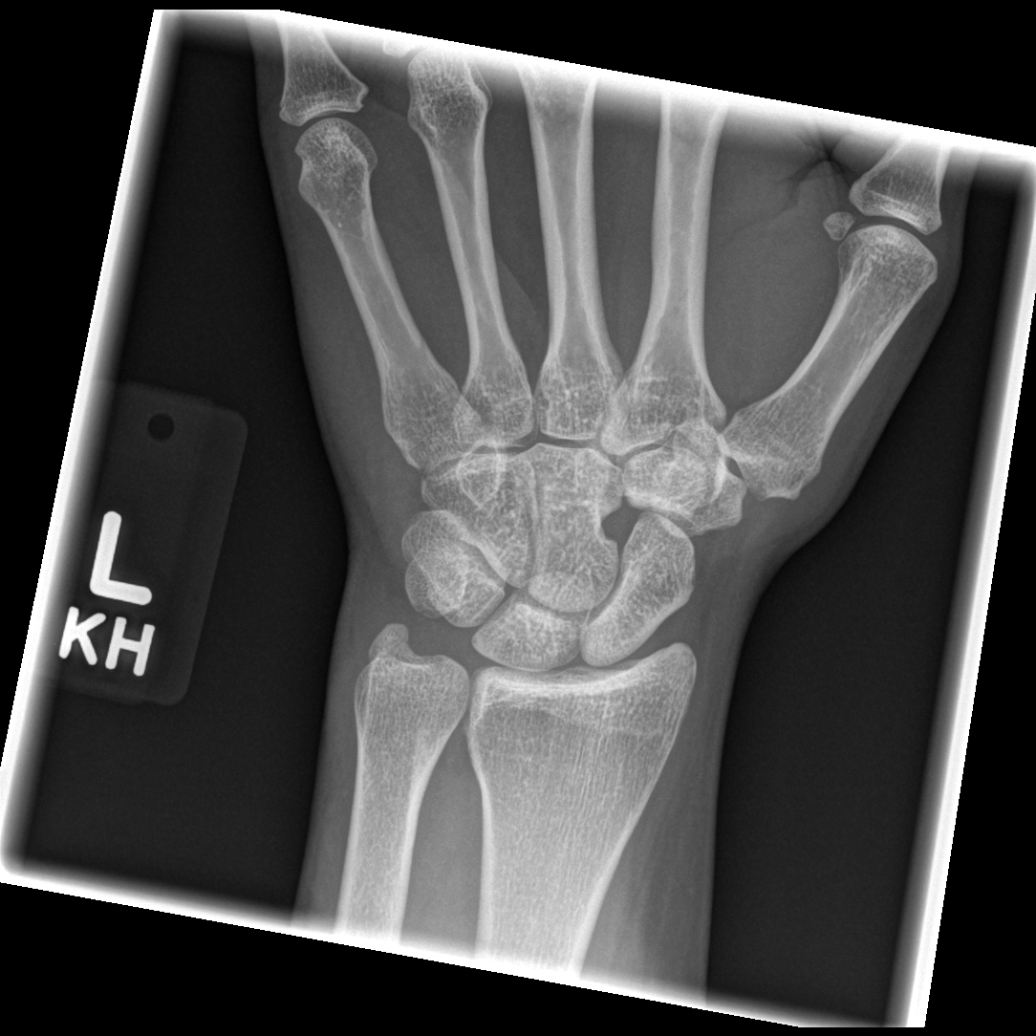

[4 of 4 positions shown; findings below may reference images not displayed]

FINDINGS: No triquetral fracture or other fracture involving the wrist is
identified. Pronator fat pad normal.
IMPRESSION: Negative.

## 2021-06-11 DIAGNOSIS — R41840 Attention and concentration deficit: Secondary | ICD-10-CM | POA: Diagnosis not present

## 2021-06-11 DIAGNOSIS — F331 Major depressive disorder, recurrent, moderate: Secondary | ICD-10-CM | POA: Diagnosis not present

## 2021-06-11 DIAGNOSIS — F411 Generalized anxiety disorder: Secondary | ICD-10-CM | POA: Diagnosis not present

## 2021-06-11 DIAGNOSIS — F332 Major depressive disorder, recurrent severe without psychotic features: Secondary | ICD-10-CM | POA: Diagnosis not present

## 2021-06-25 DIAGNOSIS — F332 Major depressive disorder, recurrent severe without psychotic features: Secondary | ICD-10-CM | POA: Diagnosis not present

## 2021-06-25 DIAGNOSIS — F411 Generalized anxiety disorder: Secondary | ICD-10-CM | POA: Diagnosis not present

## 2021-07-27 DIAGNOSIS — R300 Dysuria: Secondary | ICD-10-CM | POA: Diagnosis not present

## 2021-07-27 DIAGNOSIS — N390 Urinary tract infection, site not specified: Secondary | ICD-10-CM | POA: Diagnosis not present

## 2021-07-27 DIAGNOSIS — R109 Unspecified abdominal pain: Secondary | ICD-10-CM | POA: Diagnosis not present

## 2021-08-03 DIAGNOSIS — M5442 Lumbago with sciatica, left side: Secondary | ICD-10-CM | POA: Diagnosis not present

## 2021-08-03 DIAGNOSIS — M5441 Lumbago with sciatica, right side: Secondary | ICD-10-CM | POA: Diagnosis not present

## 2021-08-03 DIAGNOSIS — G8929 Other chronic pain: Secondary | ICD-10-CM | POA: Diagnosis not present

## 2021-08-04 DIAGNOSIS — F332 Major depressive disorder, recurrent severe without psychotic features: Secondary | ICD-10-CM | POA: Diagnosis not present

## 2021-08-04 DIAGNOSIS — F900 Attention-deficit hyperactivity disorder, predominantly inattentive type: Secondary | ICD-10-CM | POA: Diagnosis not present

## 2021-08-04 DIAGNOSIS — F331 Major depressive disorder, recurrent, moderate: Secondary | ICD-10-CM | POA: Diagnosis not present

## 2021-08-04 DIAGNOSIS — F411 Generalized anxiety disorder: Secondary | ICD-10-CM | POA: Diagnosis not present

## 2021-08-04 DIAGNOSIS — R41840 Attention and concentration deficit: Secondary | ICD-10-CM | POA: Diagnosis not present

## 2021-08-05 DIAGNOSIS — F900 Attention-deficit hyperactivity disorder, predominantly inattentive type: Secondary | ICD-10-CM | POA: Diagnosis not present

## 2021-08-16 DIAGNOSIS — T699XXA Effect of reduced temperature, unspecified, initial encounter: Secondary | ICD-10-CM | POA: Diagnosis not present

## 2021-08-18 DIAGNOSIS — M533 Sacrococcygeal disorders, not elsewhere classified: Secondary | ICD-10-CM | POA: Diagnosis not present

## 2021-08-18 DIAGNOSIS — M5136 Other intervertebral disc degeneration, lumbar region: Secondary | ICD-10-CM | POA: Diagnosis not present

## 2021-08-18 DIAGNOSIS — M47816 Spondylosis without myelopathy or radiculopathy, lumbar region: Secondary | ICD-10-CM | POA: Diagnosis not present

## 2021-08-18 DIAGNOSIS — M4726 Other spondylosis with radiculopathy, lumbar region: Secondary | ICD-10-CM | POA: Diagnosis not present

## 2021-08-19 DIAGNOSIS — F332 Major depressive disorder, recurrent severe without psychotic features: Secondary | ICD-10-CM | POA: Diagnosis not present

## 2021-08-19 DIAGNOSIS — M9905 Segmental and somatic dysfunction of pelvic region: Secondary | ICD-10-CM | POA: Diagnosis not present

## 2021-08-19 DIAGNOSIS — M5386 Other specified dorsopathies, lumbar region: Secondary | ICD-10-CM | POA: Diagnosis not present

## 2021-08-19 DIAGNOSIS — F411 Generalized anxiety disorder: Secondary | ICD-10-CM | POA: Diagnosis not present

## 2021-08-19 DIAGNOSIS — F900 Attention-deficit hyperactivity disorder, predominantly inattentive type: Secondary | ICD-10-CM | POA: Diagnosis not present

## 2021-08-19 DIAGNOSIS — M9903 Segmental and somatic dysfunction of lumbar region: Secondary | ICD-10-CM | POA: Diagnosis not present

## 2021-08-19 DIAGNOSIS — M9904 Segmental and somatic dysfunction of sacral region: Secondary | ICD-10-CM | POA: Diagnosis not present

## 2021-09-02 DIAGNOSIS — F411 Generalized anxiety disorder: Secondary | ICD-10-CM | POA: Diagnosis not present

## 2021-09-02 DIAGNOSIS — F900 Attention-deficit hyperactivity disorder, predominantly inattentive type: Secondary | ICD-10-CM | POA: Diagnosis not present

## 2021-09-02 DIAGNOSIS — F332 Major depressive disorder, recurrent severe without psychotic features: Secondary | ICD-10-CM | POA: Diagnosis not present

## 2021-09-23 DIAGNOSIS — M5136 Other intervertebral disc degeneration, lumbar region: Secondary | ICD-10-CM | POA: Diagnosis not present

## 2021-09-29 DIAGNOSIS — F332 Major depressive disorder, recurrent severe without psychotic features: Secondary | ICD-10-CM | POA: Diagnosis not present

## 2021-09-29 DIAGNOSIS — F900 Attention-deficit hyperactivity disorder, predominantly inattentive type: Secondary | ICD-10-CM | POA: Diagnosis not present

## 2021-09-29 DIAGNOSIS — F411 Generalized anxiety disorder: Secondary | ICD-10-CM | POA: Diagnosis not present

## 2021-10-13 DIAGNOSIS — F411 Generalized anxiety disorder: Secondary | ICD-10-CM | POA: Diagnosis not present

## 2021-10-13 DIAGNOSIS — F900 Attention-deficit hyperactivity disorder, predominantly inattentive type: Secondary | ICD-10-CM | POA: Diagnosis not present

## 2021-10-13 DIAGNOSIS — F332 Major depressive disorder, recurrent severe without psychotic features: Secondary | ICD-10-CM | POA: Diagnosis not present

## 2021-10-27 DIAGNOSIS — F411 Generalized anxiety disorder: Secondary | ICD-10-CM | POA: Diagnosis not present

## 2021-10-27 DIAGNOSIS — F900 Attention-deficit hyperactivity disorder, predominantly inattentive type: Secondary | ICD-10-CM | POA: Diagnosis not present

## 2021-10-27 DIAGNOSIS — F332 Major depressive disorder, recurrent severe without psychotic features: Secondary | ICD-10-CM | POA: Diagnosis not present

## 2021-10-29 ENCOUNTER — Telehealth: Payer: Self-pay | Admitting: Hematology and Oncology

## 2021-10-29 ENCOUNTER — Emergency Department (HOSPITAL_COMMUNITY)
Admission: EM | Admit: 2021-10-29 | Discharge: 2021-10-29 | Disposition: A | Discharge status: Home / Self Care | Payer: Medicaid Other | Attending: Emergency Medicine | Admitting: Emergency Medicine

## 2021-10-29 ENCOUNTER — Emergency Department (HOSPITAL_COMMUNITY): Payer: Medicaid Other

## 2021-10-29 ENCOUNTER — Encounter (HOSPITAL_COMMUNITY): Payer: Self-pay

## 2021-10-29 DIAGNOSIS — R7402 Elevation of levels of lactic acid dehydrogenase (LDH): Secondary | ICD-10-CM | POA: Diagnosis not present

## 2021-10-29 DIAGNOSIS — N939 Abnormal uterine and vaginal bleeding, unspecified: Secondary | ICD-10-CM

## 2021-10-29 DIAGNOSIS — D259 Leiomyoma of uterus, unspecified: Secondary | ICD-10-CM | POA: Insufficient documentation

## 2021-10-29 DIAGNOSIS — N83202 Unspecified ovarian cyst, left side: Secondary | ICD-10-CM | POA: Diagnosis not present

## 2021-10-29 DIAGNOSIS — D250 Submucous leiomyoma of uterus: Secondary | ICD-10-CM

## 2021-10-29 LAB — COMPREHENSIVE METABOLIC PANEL
ALT: 13 U/L (ref 0–44)
AST: 18 U/L (ref 15–41)
Albumin: 3.8 g/dL (ref 3.5–5.0)
Alkaline Phosphatase: 64 U/L (ref 38–126)
Anion gap: 9 (ref 5–15)
BUN: 14 mg/dL (ref 6–20)
CO2: 21 mmol/L — ABNORMAL LOW (ref 22–32)
Calcium: 8.5 mg/dL — ABNORMAL LOW (ref 8.9–10.3)
Chloride: 111 mmol/L (ref 98–111)
Creatinine, Ser: 0.72 mg/dL (ref 0.44–1.00)
GFR, Estimated: >60 mL/min (ref >60)
Glucose, Bld: 100 mg/dL — ABNORMAL HIGH (ref 70–99)
Potassium: 3.5 mmol/L (ref 3.5–5.1)
Sodium: 141 mmol/L (ref 135–145)
Total Bilirubin: 0.4 mg/dL (ref 0.3–1.2)
Total Protein: 7.6 g/dL (ref 6.5–8.1)

## 2021-10-29 LAB — CBC WITH DIFFERENTIAL/PLATELET
Abs Immature Granulocytes: 0.02 10*3/uL (ref 0.00–0.07)
Basophils Absolute: 0.1 10*3/uL (ref 0.0–0.1)
Basophils Relative: 1 %
Eosinophils Absolute: 0.1 10*3/uL (ref 0.0–0.5)
Eosinophils Relative: 2 %
HCT: 30.8 % — ABNORMAL LOW (ref 36.0–46.0)
Hemoglobin: 8.7 g/dL — ABNORMAL LOW (ref 12.0–15.0)
Immature Granulocytes: 0 %
Lymphocytes Relative: 23 %
Lymphs Abs: 1.9 10*3/uL (ref 0.7–4.0)
MCH: 18.3 pg — ABNORMAL LOW (ref 26.0–34.0)
MCHC: 28.2 g/dL — ABNORMAL LOW (ref 30.0–36.0)
MCV: 64.7 fL — ABNORMAL LOW (ref 80.0–100.0)
Monocytes Absolute: 0.8 10*3/uL (ref 0.1–1.0)
Monocytes Relative: 10 %
Neutro Abs: 5.2 10*3/uL (ref 1.7–7.7)
Neutrophils Relative %: 64 %
Platelets: 361 10*3/uL (ref 150–400)
RBC: 4.76 MIL/uL (ref 3.87–5.11)
RDW: 19.9 % — ABNORMAL HIGH (ref 11.5–15.5)
WBC: 8.1 10*3/uL (ref 4.0–10.5)
nRBC: 0 % (ref 0.0–0.2)

## 2021-10-29 LAB — URINALYSIS, ROUTINE W REFLEX MICROSCOPIC
Bilirubin Urine: NEGATIVE
Glucose, UA: NEGATIVE mg/dL
Ketones, ur: NEGATIVE mg/dL
Leukocytes,Ua: NEGATIVE
Nitrite: NEGATIVE
Protein, ur: NEGATIVE mg/dL
Specific Gravity, Urine: 1.025 (ref 1.005–1.030)
pH: 6 (ref 5.0–8.0)

## 2021-10-29 LAB — RETICULOCYTES
Immature Retic Fract: 22.4 % — ABNORMAL HIGH (ref 2.3–15.9)
RBC.: 4.73 MIL/uL (ref 3.87–5.11)
Retic Count, Absolute: 64.3 10*3/uL (ref 19.0–186.0)
Retic Ct Pct: 1.4 % (ref 0.4–3.1)

## 2021-10-29 LAB — TYPE AND SCREEN
ABO/RH(D): O POS
Antibody Screen: NEGATIVE

## 2021-10-29 LAB — LACTATE DEHYDROGENASE: LDH: 315 U/L — ABNORMAL HIGH (ref 98–192)

## 2021-10-29 LAB — I-STAT BETA HCG BLOOD, ED (MC, WL, AP ONLY): I-stat hCG, quantitative: <5 m[IU]/mL (ref <5)

## 2021-10-29 LAB — TSH: TSH: 2.222 u[IU]/mL (ref 0.350–4.500)

## 2021-10-29 LAB — DIRECT ANTIGLOBULIN TEST (NOT AT ARMC)
DAT, IgG: NEGATIVE
DAT, complement: NEGATIVE

## 2021-10-29 MED ORDER — MEGESTROL ACETATE 40 MG PO TABS: ORAL_TABLET | ORAL | 0 refills | Status: DC

## 2021-10-29 MED ORDER — NAPROXEN 375 MG PO TABS: 375.0000 mg | ORAL_TABLET | Freq: Two times a day (BID) | ORAL | 0 refills | Status: DC

## 2021-10-29 MED ORDER — TRANEXAMIC ACID 650 MG PO TABS: ORAL_TABLET | ORAL | 0 refills | Status: DC

## 2021-10-29 MED ORDER — SODIUM CHLORIDE 0.9 % IV BOLUS
1000.0000 mL | Freq: Once | INTRAVENOUS | Status: AC
  Administered 2021-10-29: 1000 mL via INTRAVENOUS

## 2021-10-29 NOTE — ED Provider Notes (Signed)
[facility] DEPT Provider Note   CSN: [id number] Arrival date & time: 10/29/21  0154     History {Add pertinent medical, surgical, social history, OB history to HPI:1} Chief Complaint  Patient presents with   Vaginal Bleeding    Maria Ho is a 41 y.o. female.   Vaginal Bleeding      Home Medications Prior to Admission medications   Medication Sig Start Date End Date Taking? Authorizing Provider  buPROPion (WELLBUTRIN SR) 150 MG 12 hr tablet Take 150 mg by mouth daily. 10/01/21   [provider]  busPIRone (BUSPAR) 10 MG tablet Take 10 mg by mouth 2 (two) times daily. 10/27/21   [provider]  hydrOXYzine (VISTARIL) 25 MG capsule Take 50 mg by mouth 2 (two) times daily. 10/27/21   [provider]  ibuprofen (ADVIL,MOTRIN) 800 MG tablet Take 1 tablet (800 mg total) by mouth 3 (three) times daily. Patient not taking: Reported on 05/27/2019 08/25/16   Antonietta Breach, PA-C  valACYclovir (VALTREX) 500 MG tablet Take 500 mg by mouth 2 (two) times daily. 04/26/21   [provider]  VYVANSE 50 MG capsule Take 50 mg by mouth every morning. 10/15/21   [provider]      Allergies    Patient has no known allergies.    Review of Systems   Review of Systems  Genitourinary:  Positive for vaginal bleeding.    Physical Exam Updated Vital Signs BP (!) 168/111   Pulse (!) 101   Temp 99 F (37.2 C) (Oral)   Resp 20   Ht '5\' 9"'$  (1.753 m)   Wt 98.9 kg   LMP 10/17/2021 (Exact Date)   SpO2 100%   BMI 32.19 kg/m  Physical Exam Vitals and nursing note reviewed. Exam conducted with a chaperone present.  Constitutional:      General: She is not in acute distress.    Appearance: She is well-developed. She is not diaphoretic.  HENT:     Head: Normocephalic and atraumatic.     Right Ear: External ear normal.     Left Ear: External ear normal.     Nose: Nose normal.     Mouth/Throat:     Mouth: Mucous  membranes are moist.  Eyes:     General: No scleral icterus.    Conjunctiva/sclera: Conjunctivae normal.  Cardiovascular:     Rate and Rhythm: Normal rate and regular rhythm.     Heart sounds: Normal heart sounds. No murmur heard.    No friction rub. No gallop.  Pulmonary:     Effort: Pulmonary effort is normal. No respiratory distress.     Breath sounds: Normal breath sounds.  Abdominal:     General: Bowel sounds are normal. There is no distension.     Palpations: Abdomen is soft. There is no mass.     Tenderness: There is no abdominal tenderness. There is no guarding.  Genitourinary:    Comments: Normal external female genitalia.  Vaginal tissue is pink and well rugated, there is faint red blood in the vaginal vault cleared by Fox swab.  Cervix is normal-appearing there is dark clot in the cervical os.  No active passage of clots, no cervical motion tenderness.  Uterus is palpable with large firm mass, adnexa are nontender without fullness. Musculoskeletal:     Cervical back: Normal range of motion.  Skin:    General: Skin is warm and dry.  Neurological:     Mental Status: She is  alert and oriented to person, place, and time.  Psychiatric:        Behavior: Behavior normal.     ED Results / Procedures / Treatments   Labs (all labs ordered are listed, but only abnormal results are displayed) Labs Reviewed  CBC WITH DIFFERENTIAL/PLATELET - Abnormal; Notable for the following components:      Result Value   Hemoglobin 8.7 (*)    HCT 30.8 (*)    MCV 64.7 (*)    MCH 18.3 (*)    MCHC 28.2 (*)    RDW 19.9 (*)    All other components within normal limits  COMPREHENSIVE METABOLIC PANEL - Abnormal; Notable for the following components:   CO2 21 (*)    Glucose, Bld 100 (*)    Calcium 8.5 (*)    All other components within normal limits  URINALYSIS, ROUTINE W REFLEX MICROSCOPIC  I-STAT BETA HCG BLOOD, ED ([facility], [facility], [facility] ONLY)  TYPE AND SCREEN    EKG None  Radiology No results  found.  Procedures Procedures  {Document cardiac monitor, telemetry assessment procedure when appropriate:1}  Medications Ordered in ED Medications  sodium chloride 0.9 % bolus 1,000 mL (1,000 mLs Intravenous New Bag/Given 10/29/21 0303)    ED Course/ Medical Decision Making/ A&P Clinical Course as of 10/29/21 0344  Fri Oct 29, 2021  0315 Hemoglobin(!): 8.7 [AH]  0342 Schistocytes: PRESENT [AH]  0342 Polychromasia: PRESENT [AH]  0342 Target Cells: PRESENT [AH]  0342 Ovalocytes: PRESENT [AH]    Clinical Course User Index [AH] Margarita Mail, PA-C                           Medical Decision Making Amount and/or Complexity of Data Reviewed Labs: ordered. Decision-making details documented in ED Course.   ***  {Document critical care time when appropriate:1} {Document review of labs and clinical decision tools ie heart score, Chads2Vasc2 etc:1}  {Document your independent review of radiology images, and any outside records:1} {Document your discussion with family members, caretakers, and with consultants:1} {Document social determinants of health affecting pt's care:1} {Document your decision making why or why not admission, treatments were needed:1} Final Clinical Impression(s) / ED Diagnoses Final diagnoses:  None    Rx / DC Orders ED Discharge Orders     None

## 2021-10-29 NOTE — Discharge Instructions (Addendum)
Your ultrasound showed a large uterine fibroid without other abnormal findings.  This is likely the cause of your bleeding.   Your labs also showed abnormal peripheral smear.  I discussed this finding with the hematologist on-call who recommends follow-up but suspect this is due to severe iron deficiency anemia. I have given you an ambulatory referral to gynecology.  If you have not heard from them by next Tuesday please call them directly. I have also given you an ambulatory referral to hematology.  They should be contacting you.  I am discharging you with medication to help stop or slow your bleeding.  Please start by taking the Lysteda (tranexamic acid tablets) and naproxen.  If your bleeding does not significantly slow or stop you may then add Megace.  The Megace is hormonal and has more side effects.  Get help right away if you: Faint. Have pelvic pain that suddenly gets worse. Have severe vaginal bleeding that soaks a tampon or pad in 30 minutes or less.

## 2021-10-29 NOTE — ED Triage Notes (Addendum)
Pt came in with c/o vaginal bleeding. Had normal period 2 weeks ago. Tonight she felt a large gush while in bed and passed a clot the size of her fist. She passed a second one that was 1/3 of the size. Pt has hx of fibroids. Pt also endorses lower back pain. HR 120. Pale skin.

## 2021-10-29 NOTE — Telephone Encounter (Signed)
Scheduled appt per 6/30 referral. Pt is aware of appt date and time. Pt is aware to arrive 15 mins prior to appt time and to bring and updated insurance card. Pt is aware of appt location.   

## 2021-10-29 NOTE — ED Notes (Signed)
PT state she feel light head while standing. PT was able to complete orthostatic vital signs as well as walk to and from restroom

## 2021-10-30 LAB — HAPTOGLOBIN: Haptoglobin: 124 mg/dL (ref 42–296)

## 2021-11-01 ENCOUNTER — Other Ambulatory Visit: Payer: Self-pay

## 2021-11-01 ENCOUNTER — Inpatient Hospital Stay: Payer: Medicaid Other | Attending: Hematology and Oncology | Admitting: Hematology and Oncology

## 2021-11-01 ENCOUNTER — Encounter: Payer: Self-pay | Admitting: Hematology and Oncology

## 2021-11-01 ENCOUNTER — Inpatient Hospital Stay: Payer: Medicaid Other

## 2021-11-01 VITALS — BP 153/99 | HR 109 | Temp 97.5°F | Resp 17 | Ht 69.0 in | Wt 213.9 lb

## 2021-11-01 DIAGNOSIS — Z79899 Other long term (current) drug therapy: Secondary | ICD-10-CM | POA: Diagnosis not present

## 2021-11-01 DIAGNOSIS — D649 Anemia, unspecified: Secondary | ICD-10-CM | POA: Diagnosis not present

## 2021-11-01 DIAGNOSIS — D500 Iron deficiency anemia secondary to blood loss (chronic): Secondary | ICD-10-CM | POA: Diagnosis not present

## 2021-11-01 DIAGNOSIS — D509 Iron deficiency anemia, unspecified: Secondary | ICD-10-CM | POA: Insufficient documentation

## 2021-11-01 DIAGNOSIS — F419 Anxiety disorder, unspecified: Secondary | ICD-10-CM | POA: Insufficient documentation

## 2021-11-01 LAB — IRON AND IRON BINDING CAPACITY (CC-WL,HP ONLY)
Iron: 15 ug/dL — ABNORMAL LOW (ref 28–170)
Saturation Ratios: 3 % — ABNORMAL LOW (ref 10.4–31.8)
TIBC: 491 ug/dL — ABNORMAL HIGH (ref 250–450)
UIBC: 476 ug/dL — ABNORMAL HIGH (ref 148–442)

## 2021-11-01 LAB — CBC WITH DIFFERENTIAL/PLATELET
Abs Immature Granulocytes: 0.01 10*3/uL (ref 0.00–0.07)
Basophils Absolute: 0.1 10*3/uL (ref 0.0–0.1)
Basophils Relative: 1 %
Eosinophils Absolute: 0.2 10*3/uL (ref 0.0–0.5)
Eosinophils Relative: 3 %
HCT: 28.9 % — ABNORMAL LOW (ref 36.0–46.0)
Hemoglobin: 8.5 g/dL — ABNORMAL LOW (ref 12.0–15.0)
Immature Granulocytes: 0 %
Lymphocytes Relative: 24 %
Lymphs Abs: 1.3 10*3/uL (ref 0.7–4.0)
MCH: 18.3 pg — ABNORMAL LOW (ref 26.0–34.0)
MCHC: 29.4 g/dL — ABNORMAL LOW (ref 30.0–36.0)
MCV: 62.3 fL — ABNORMAL LOW (ref 80.0–100.0)
Monocytes Absolute: 0.5 10*3/uL (ref 0.1–1.0)
Monocytes Relative: 9 %
Neutro Abs: 3.5 10*3/uL (ref 1.7–7.7)
Neutrophils Relative %: 63 %
Platelets: 340 10*3/uL (ref 150–400)
RBC: 4.64 MIL/uL (ref 3.87–5.11)
RDW: 19.7 % — ABNORMAL HIGH (ref 11.5–15.5)
Smear Review: ADEQUATE
WBC: 5.5 10*3/uL (ref 4.0–10.5)
nRBC: 0 % (ref 0.0–0.2)

## 2021-11-01 LAB — VITAMIN B12: Vitamin B-12: 165 pg/mL — ABNORMAL LOW (ref 180–914)

## 2021-11-01 LAB — FERRITIN: Ferritin: 3 ng/mL — ABNORMAL LOW (ref 11–307)

## 2021-11-01 NOTE — Progress Notes (Signed)
[facility] CONSULT NOTE  Patient Care Team: Juanda Chance as PCP - General (Physician Assistant)  CHIEF COMPLAINTS/PURPOSE OF CONSULTATION:  IDA  ASSESSMENT & PLAN:   This is a very pleasant 41 year old female patient with no past medical history except for anxiety referred to hematology for further evaluation of severe anemia. Patient denies any past medical history significant for anemia that warranted IV iron supplementation or blood transfusion.  She most recently went to the emergency room with anemia, referred to hematology for this. She complains of some fatigue, dizziness, shortness of breath with exertion.  She is not on any oral iron supplementation.  She is yet to establish with OB/GYN.  She has history of fibroids.  Physical examination today unremarkable, no palpable lymphadenopathy or hepatosplenomegaly. I have reviewed her most recent labs, noted severe microcytic hypochromic anemia, presentation highly suggestive of iron deficiency, ferritin pending however rest of the iron panel supports severe iron deficiency.  I have also ordered W41, folic acid and hemoglobin electrophoresis today.  However I do not suspect any thalassemia since her past hemoglobin was perfectly normal without any evidence of microcytosis or hypochromia.  Labs resulted so far do suggest iron deficiency anemia and B12 deficiency.  I have called the patient and discussed about IV iron as well as B12 supplementation.  We have discussed about adverse effects from IV iron including but not limited to fatigue, myalgias, arthralgias, flulike symptoms, headaches and very rarely anaphylaxis.  Venofer plan placed.  In basket message sent to scheduling for iron infusion.  We will send a prescription for B12 injections, discussed about taking it once a day for a week followed by once a week for 4 weeks and then once a month.  She felt confident giving her the injections since she has previously  injected herself with the hCG injections.  Return to clinic in about 6 to 8 weeks.  HISTORY OF PRESENTING ILLNESS:  Maria Ho 41 y.o. female is here because of anemia.  This is a very pleasant 41 year old female patient with no significant past medical history referred to hematology for evaluation of anemia.  She was seen in the emergency room with chief complaint of heavy vaginal bleeding that has been ongoing for the past 3 to 4 weeks.  When she was pregnant about 10 years ago, she was iron deficient and needed to take iron pills.  She complains of ongoing vaginal bleeding, has to follow-up with OB/GYN.  She has history of fibroids.  She does not remember any prior hematological history other than iron deficiency during pregnancy.  Mom however mentions that anemia runs in the family, herself, maternal grandmother were all iron deficient.  She complains of some dizziness especially with positional change, fatigue and shortness of breath with exertion, denies any hair loss, brittle nails.  She denies any other bleeding.  She had 4 children, last was 10 years ago, briefly breast-fed.   Rest of the pertinent 10 point ROS reviewed and negative  MEDICAL HISTORY:  Past Medical History:  Diagnosis Date   Anemia    Anxiety    PTSD   Arrhythmia    Arthritis    Depression    Fibroid    Headache(784.0)    Migraines   HSV (herpes simplex virus) infection    no outbreak during current pregnancy   Migraine     SURGICAL HISTORY: Past Surgical History:  Procedure Laterality Date   NO PAST SURGERIES     None  SOCIAL HISTORY: Social History   Socioeconomic History   Marital status: Single    Spouse name: Not on file   Number of children: 3   Years of education: Not on file   Highest education level: Not on file  Occupational History    Employer: [employer]  Tobacco Use   Smoking status: Never   Smokeless tobacco: Never  Vaping Use   Vaping Use: Never used  Substance and Sexual  Activity   Alcohol use: Yes    Comment: occasionally   Drug use: Yes    Types: Marijuana    Comment: last night   Sexual activity: Yes    Birth control/protection: I.U.D.  Other Topics Concern   Not on file  Social History Narrative   Lives with three children.   Occasional caffeine.   Social Determinants of Health   Financial Resource Strain: Not on file  Food Insecurity: Not on file  Transportation Needs: Not on file  Physical Activity: Not on file  Stress: Not on file  Social Connections: Not on file  Intimate Partner Violence: Not on file    FAMILY HISTORY: Family History  Problem Relation Age of Onset   Post-traumatic stress disorder Father    Alcohol abuse Father    Drug abuse Father    Healthy Mother    Diabetes Maternal Grandmother    Cancer Paternal Grandmother        Great-Grandparent   Cancer Paternal Grandfather        Great-Gradnparent    ALLERGIES:  has No Known Allergies.  MEDICATIONS:  Current Outpatient Medications  Medication Sig Dispense Refill   buPROPion (WELLBUTRIN SR) 150 MG 12 hr tablet Take 150 mg by mouth daily.     busPIRone (BUSPAR) 10 MG tablet Take 10 mg by mouth 2 (two) times daily.     hydrOXYzine (VISTARIL) 25 MG capsule Take 50 mg by mouth 2 (two) times daily.     ibuprofen (ADVIL,MOTRIN) 800 MG tablet Take 1 tablet (800 mg total) by mouth 3 (three) times daily. (Patient not taking: Reported on 05/27/2019) 21 tablet 0   megestrol (MEGACE) 40 MG tablet 3 tabs a day for 5 days(all at once) 2 tabs a day for 5 days(all at once) then 1 tablet a day 45 tablet 0   naproxen (NAPROSYN) 375 MG tablet Take 1 tablet (375 mg total) by mouth 2 (two) times daily with a meal. 20 tablet 0   tranexamic acid (LYSTEDA) 650 MG TABS tablet Take 2 tablets 3 times a day for 5 days , 2 tablets 2 times a day for 5 days, and then 1 tablet daily. 45 tablet 0   valACYclovir (VALTREX) 500 MG tablet Take 500 mg by mouth 2 (two) times daily. (Patient not taking:  Reported on 10/29/2021)     VYVANSE 50 MG capsule Take 50 mg by mouth every morning.     No current facility-administered medications for this visit.     PHYSICAL EXAMINATION: ECOG PERFORMANCE STATUS: 0 - Asymptomatic  Vitals:   11/01/21 0946 11/01/21 0947  BP: (!) 158/106 (!) 153/99  Pulse: (!) 109   Resp: 17   Temp: (!) 97.5 F (36.4 C)   SpO2: 100%    Filed Weights   11/01/21 0946  Weight: 213 lb 14.4 oz (97 kg)    Physical Exam Constitutional:      Appearance: Normal appearance.  Cardiovascular:     Rate and Rhythm: Normal rate and regular rhythm.  Abdominal:  General: Abdomen is flat. Bowel sounds are normal.     Palpations: Abdomen is soft.  Musculoskeletal:        General: No swelling or tenderness. Normal range of motion.     Cervical back: Normal range of motion and neck supple.  Skin:    General: Skin is warm and dry.  Neurological:     General: No focal deficit present.     Mental Status: She is alert.   LABORATORY DATA:  I have reviewed the data as listed Lab Results  Component Value Date   WBC 8.1 10/29/2021   HGB 8.7 (L) 10/29/2021   HCT 30.8 (L) 10/29/2021   MCV 64.7 (L) 10/29/2021   PLT 361 10/29/2021     Chemistry      Component Value Date/Time   NA 141 10/29/2021 0230   K 3.5 10/29/2021 0230   CL 111 10/29/2021 0230   CO2 21 (L) 10/29/2021 0230   BUN 14 10/29/2021 0230   CREATININE 0.72 10/29/2021 0230      Component Value Date/Time   CALCIUM 8.5 (L) 10/29/2021 0230   ALKPHOS 64 10/29/2021 0230   AST 18 10/29/2021 0230   ALT 13 10/29/2021 0230   BILITOT 0.4 10/29/2021 0230       RADIOGRAPHIC STUDIES: I have personally reviewed the radiological images as listed and agreed with the findings in the report. US Pelvis Complete  Result Date: 10/29/2021 CLINICAL DATA:  Pelvic pain and heavy bleeding. EXAM: TRANSABDOMINAL AND TRANSVAGINAL ULTRASOUND OF PELVIS COLOR DOPPLER ULTRASOUND OF OVARIES TECHNIQUE: Both transabdominal and  transvaginal ultrasound examinations of the pelvis were performed. Transabdominal technique was performed for global imaging of the pelvis including uterus, ovaries, adnexal regions, and pelvic cul-de-sac. It was necessary to proceed with endovaginal exam following the transabdominal exam to visualize the adnexal spaces and endometrium better. Color Doppler ultrasound was utilized to evaluate blood flow to the ovaries. COMPARISON:  The CTA chest, abdomen and pelvis 10/27/2014. FINDINGS: Uterus Measurements: 13.7 x 10.1 x 10.6 cm anteverted = volume: 769.6 mL. There is a heterogeneously echogenic submucosal fibroid measuring 7.3 x 7.4 x 7.5 cm in the fundal area, and in the left dorsal fundus a second fibroid measuring 5.1 x 5.5 cm. Endometrium The endometrium is obscured by the fibroids and cannot be measured. Right ovary Measurements: 3.6 x 1.8 x 2.7 cm = volume: 9.2 mL. Dominant simple follicle noted measuring 2.6 cm. Otherwise normal appearance/no adnexal mass. Left ovary Measurements: 4.1 x 2.5 x 3.8 cm = volume: 20.3 mL. There is a 3.1 cm simple cyst. Otherwise normal appearance/no adnexal mass. Color doppler evaluation of both ovaries demonstrates normal appearing color flow. Other findings No abnormal free fluid. IMPRESSION: 1. Fibroid uterus. 2. Endometrium obscured by fibroids. 3. 3.1 cm simple cyst left ovary. 4. No evidence of ovarian mass or torsion. Electronically Signed   By: Telford Nab M.D.   On: 10/29/2021 06:47   US Transvaginal Non-OB  Result Date: 10/29/2021 CLINICAL DATA:  Pelvic pain and heavy bleeding. EXAM: TRANSABDOMINAL AND TRANSVAGINAL ULTRASOUND OF PELVIS COLOR DOPPLER ULTRASOUND OF OVARIES TECHNIQUE: Both transabdominal and transvaginal ultrasound examinations of the pelvis were performed. Transabdominal technique was performed for global imaging of the pelvis including uterus, ovaries, adnexal regions, and pelvic cul-de-sac. It was necessary to proceed with endovaginal exam  following the transabdominal exam to visualize the adnexal spaces and endometrium better. Color Doppler ultrasound was utilized to evaluate blood flow to the ovaries. COMPARISON:  The CTA chest, abdomen and pelvis  10/27/2014. FINDINGS: Uterus Measurements: 13.7 x 10.1 x 10.6 cm anteverted = volume: 769.6 mL. There is a heterogeneously echogenic submucosal fibroid measuring 7.3 x 7.4 x 7.5 cm in the fundal area, and in the left dorsal fundus a second fibroid measuring 5.1 x 5.5 cm. Endometrium The endometrium is obscured by the fibroids and cannot be measured. Right ovary Measurements: 3.6 x 1.8 x 2.7 cm = volume: 9.2 mL. Dominant simple follicle noted measuring 2.6 cm. Otherwise normal appearance/no adnexal mass. Left ovary Measurements: 4.1 x 2.5 x 3.8 cm = volume: 20.3 mL. There is a 3.1 cm simple cyst. Otherwise normal appearance/no adnexal mass. Color doppler evaluation of both ovaries demonstrates normal appearing color flow. Other findings No abnormal free fluid. IMPRESSION: 1. Fibroid uterus. 2. Endometrium obscured by fibroids. 3. 3.1 cm simple cyst left ovary. 4. No evidence of ovarian mass or torsion. Electronically Signed   By: Telford Nab M.D.   On: 10/29/2021 06:47    All questions were answered. The patient knows to call the clinic with any problems, questions or concerns. I spent 45 minutes in the care of this patient including H and P, review of records, counseling and coordination of care.     Benay Pike, MD 11/01/2021 9:50 AM

## 2021-11-03 ENCOUNTER — Other Ambulatory Visit (HOSPITAL_COMMUNITY): Payer: Self-pay

## 2021-11-03 ENCOUNTER — Telehealth: Payer: Self-pay | Admitting: Hematology and Oncology

## 2021-11-03 ENCOUNTER — Other Ambulatory Visit: Payer: Self-pay | Admitting: *Deleted

## 2021-11-03 ENCOUNTER — Encounter: Payer: Self-pay | Admitting: Hematology and Oncology

## 2021-11-03 LAB — FOLATE RBC
Folate, Hemolysate: 339 ng/mL
Folate, RBC: 1169 ng/mL (ref >498)
Hematocrit: 29 % — ABNORMAL LOW (ref 34.0–46.6)

## 2021-11-03 MED ORDER — CYANOCOBALAMIN 1000 MCG/ML IJ SOLN
INTRAMUSCULAR | 0 refills | Status: DC
  Filled 2021-11-03: qty 10, 28d supply, fill #0

## 2021-11-03 MED ORDER — "LUER LOCK SAFETY SYRINGES 25G X 5/8"" 3 ML MISC"
1 refills | Status: DC
  Filled 2021-11-03: qty 10, 28d supply, fill #0

## 2021-11-03 NOTE — Telephone Encounter (Signed)
Scheduled appointments per provider. Patient aware.

## 2021-11-04 LAB — HGB FRACTIONATION CASCADE
Hgb A2: 1.8 % (ref 1.8–3.2)
Hgb A: 98.2 % (ref 96.4–98.8)
Hgb F: 0 % (ref 0.0–2.0)
Hgb S: 0 %

## 2021-11-05 MED FILL — Iron Sucrose Inj 20 MG/ML (Fe Equiv): INTRAVENOUS | Qty: 10 | Status: AC

## 2021-11-06 ENCOUNTER — Inpatient Hospital Stay: Payer: Medicaid Other

## 2021-11-06 VITALS — BP 164/103 | HR 111 | Temp 98.6°F | Resp 18

## 2021-11-06 DIAGNOSIS — D500 Iron deficiency anemia secondary to blood loss (chronic): Secondary | ICD-10-CM

## 2021-11-06 DIAGNOSIS — D649 Anemia, unspecified: Secondary | ICD-10-CM | POA: Diagnosis not present

## 2021-11-06 DIAGNOSIS — Z79899 Other long term (current) drug therapy: Secondary | ICD-10-CM | POA: Diagnosis not present

## 2021-11-06 DIAGNOSIS — F419 Anxiety disorder, unspecified: Secondary | ICD-10-CM | POA: Diagnosis not present

## 2021-11-06 MED ORDER — SODIUM CHLORIDE 0.9 % IV SOLN
200.0000 mg | Freq: Once | INTRAVENOUS | Status: AC
  Administered 2021-11-06: 200 mg via INTRAVENOUS
  Filled 2021-11-06: qty 200

## 2021-11-06 MED ORDER — SODIUM CHLORIDE 0.9 % IV SOLN: Freq: Once | INTRAVENOUS | Status: AC

## 2021-11-06 NOTE — Progress Notes (Signed)
Pt tolerated first time iron infusion. Pt was observed for 30 min with no reactions noted. Educated pt on iron reactions. Advised to cal facility with concerns. Pt verbalized understanding.

## 2021-11-06 NOTE — Patient Instructions (Signed)

## 2021-11-10 ENCOUNTER — Other Ambulatory Visit: Payer: Self-pay

## 2021-11-10 ENCOUNTER — Inpatient Hospital Stay: Payer: Medicaid Other

## 2021-11-10 VITALS — BP 143/98 | HR 84 | Temp 99.1°F | Resp 18

## 2021-11-10 DIAGNOSIS — D500 Iron deficiency anemia secondary to blood loss (chronic): Secondary | ICD-10-CM

## 2021-11-10 DIAGNOSIS — Z79899 Other long term (current) drug therapy: Secondary | ICD-10-CM | POA: Diagnosis not present

## 2021-11-10 DIAGNOSIS — F419 Anxiety disorder, unspecified: Secondary | ICD-10-CM | POA: Diagnosis not present

## 2021-11-10 DIAGNOSIS — D649 Anemia, unspecified: Secondary | ICD-10-CM | POA: Diagnosis not present

## 2021-11-10 MED ORDER — SODIUM CHLORIDE 0.9 % IV SOLN: Freq: Once | INTRAVENOUS | Status: AC

## 2021-11-10 MED ORDER — SODIUM CHLORIDE 0.9 % IV SOLN
200.0000 mg | Freq: Once | INTRAVENOUS | Status: AC
  Administered 2021-11-10: 200 mg via INTRAVENOUS
  Filled 2021-11-10: qty 200

## 2021-11-10 NOTE — Patient Instructions (Signed)

## 2021-11-10 NOTE — Progress Notes (Signed)
Patient referral for OB/GYN was lost- last ED AVS was printed with referral present. Patient given paperwork so she can call and make an appointment.  Patient tolerated her last treatment with no issues and has done well with today's, as well. Patient chose not to wait for her 30 minute observation period. VSS- BP (!) 143/98 (BP Location: Left Arm, Patient Position: Sitting)   Pulse 84   Temp 99.1 F (37.3 C) (Oral)   Resp 18   LMP 10/17/2021 (Exact Date)   SpO2 100%  IV removed intact. Patient aware of signs of a reaction-

## 2021-11-12 ENCOUNTER — Inpatient Hospital Stay: Payer: Medicaid Other

## 2021-11-17 ENCOUNTER — Inpatient Hospital Stay: Payer: Medicaid Other

## 2021-11-17 ENCOUNTER — Other Ambulatory Visit: Payer: Self-pay

## 2021-11-17 VITALS — BP 148/90 | HR 87 | Temp 98.6°F | Resp 16

## 2021-11-17 DIAGNOSIS — F419 Anxiety disorder, unspecified: Secondary | ICD-10-CM | POA: Diagnosis not present

## 2021-11-17 DIAGNOSIS — D649 Anemia, unspecified: Secondary | ICD-10-CM | POA: Diagnosis not present

## 2021-11-17 DIAGNOSIS — D500 Iron deficiency anemia secondary to blood loss (chronic): Secondary | ICD-10-CM

## 2021-11-17 DIAGNOSIS — Z79899 Other long term (current) drug therapy: Secondary | ICD-10-CM | POA: Diagnosis not present

## 2021-11-17 MED ORDER — SODIUM CHLORIDE 0.9 % IV SOLN
200.0000 mg | Freq: Once | INTRAVENOUS | Status: AC
  Administered 2021-11-17: 200 mg via INTRAVENOUS
  Filled 2021-11-17: qty 200

## 2021-11-17 MED ORDER — SODIUM CHLORIDE 0.9 % IV SOLN: Freq: Once | INTRAVENOUS | Status: AC

## 2021-11-17 NOTE — Progress Notes (Signed)
Patient declined 30 minute post observation. VSS at discharge.

## 2021-11-17 NOTE — Patient Instructions (Signed)

## 2021-11-19 ENCOUNTER — Encounter: Payer: Self-pay | Admitting: Hematology and Oncology

## 2021-11-19 ENCOUNTER — Inpatient Hospital Stay: Payer: Medicaid Other

## 2021-11-24 DIAGNOSIS — F900 Attention-deficit hyperactivity disorder, predominantly inattentive type: Secondary | ICD-10-CM | POA: Diagnosis not present

## 2021-11-24 DIAGNOSIS — F411 Generalized anxiety disorder: Secondary | ICD-10-CM | POA: Diagnosis not present

## 2021-11-24 DIAGNOSIS — F332 Major depressive disorder, recurrent severe without psychotic features: Secondary | ICD-10-CM | POA: Diagnosis not present

## 2021-11-26 DIAGNOSIS — G8929 Other chronic pain: Secondary | ICD-10-CM | POA: Diagnosis not present

## 2021-11-26 DIAGNOSIS — M79644 Pain in right finger(s): Secondary | ICD-10-CM | POA: Diagnosis not present

## 2021-11-26 DIAGNOSIS — G43109 Migraine with aura, not intractable, without status migrainosus: Secondary | ICD-10-CM | POA: Diagnosis not present

## 2021-11-26 DIAGNOSIS — M5442 Lumbago with sciatica, left side: Secondary | ICD-10-CM | POA: Diagnosis not present

## 2021-11-26 DIAGNOSIS — M5441 Lumbago with sciatica, right side: Secondary | ICD-10-CM | POA: Diagnosis not present

## 2021-11-26 DIAGNOSIS — B351 Tinea unguium: Secondary | ICD-10-CM | POA: Diagnosis not present

## 2021-11-26 DIAGNOSIS — N898 Other specified noninflammatory disorders of vagina: Secondary | ICD-10-CM | POA: Diagnosis not present

## 2021-12-09 DIAGNOSIS — N898 Other specified noninflammatory disorders of vagina: Secondary | ICD-10-CM | POA: Diagnosis not present

## 2021-12-14 ENCOUNTER — Ambulatory Visit: Payer: Medicaid Other | Admitting: Obstetrics and Gynecology

## 2021-12-16 ENCOUNTER — Inpatient Hospital Stay: Payer: Medicaid Other | Attending: Hematology and Oncology | Admitting: Hematology and Oncology

## 2021-12-16 ENCOUNTER — Inpatient Hospital Stay: Payer: Medicaid Other

## 2021-12-16 DIAGNOSIS — F900 Attention-deficit hyperactivity disorder, predominantly inattentive type: Secondary | ICD-10-CM | POA: Diagnosis not present

## 2021-12-16 DIAGNOSIS — F411 Generalized anxiety disorder: Secondary | ICD-10-CM | POA: Diagnosis not present

## 2021-12-16 DIAGNOSIS — F332 Major depressive disorder, recurrent severe without psychotic features: Secondary | ICD-10-CM | POA: Diagnosis not present

## 2021-12-16 NOTE — Progress Notes (Deleted)
[name] CONSULT NOTE  Patient Care Team: Juanda Chance as PCP - General (Physician Assistant)  CHIEF COMPLAINTS/PURPOSE OF CONSULTATION:  IDA  ASSESSMENT & PLAN:   This is a very pleasant 41 year old female patient with no past medical history except for anxiety referred to hematology for further evaluation of severe IDA.  Return to clinic in about 6 to 8 weeks.  HISTORY OF PRESENTING ILLNESS:  Maria Ho 41 y.o. female is here because of anemia.  This is a very pleasant 41 year old female patient with no significant past medical history referred to hematology for evaluation of anemia.       Rest of the pertinent 10 point ROS reviewed and negative  MEDICAL HISTORY:  Past Medical History:  Diagnosis Date   Anemia    Anxiety    PTSD   Arrhythmia    Arthritis    Depression    Fibroid    Headache(784.0)    Migraines   HSV (herpes simplex virus) infection    no outbreak during current pregnancy   Migraine     SURGICAL HISTORY: Past Surgical History:  Procedure Laterality Date   NO PAST SURGERIES     None      SOCIAL HISTORY: Social History   Socioeconomic History   Marital status: Single    Spouse name: Not on file   Number of children: 3   Years of education: Not on file   Highest education level: Not on file  Occupational History    Employer: [employer]  Tobacco Use   Smoking status: Never   Smokeless tobacco: Never  Vaping Use   Vaping Use: Never used  Substance and Sexual Activity   Alcohol use: Yes    Comment: occasionally   Drug use: Yes    Types: Marijuana    Comment: last night   Sexual activity: Yes    Birth control/protection: I.U.D.  Other Topics Concern   Not on file  Social History Narrative   Lives with three children.   Occasional caffeine.   Social Determinants of Health   Financial Resource Strain: Not on file  Food Insecurity: Not on file  Transportation Needs: Not on file  Physical Activity:  Not on file  Stress: Not on file  Social Connections: Not on file  Intimate Partner Violence: Not on file    FAMILY HISTORY: Family History  Problem Relation Age of Onset   Post-traumatic stress disorder Father    Alcohol abuse Father    Drug abuse Father    Healthy Mother    Diabetes Maternal Grandmother    Cancer Paternal Grandmother        Great-Grandparent   Cancer Paternal Grandfather        Great-Gradnparent    ALLERGIES:  has No Known Allergies.  MEDICATIONS:  Current Outpatient Medications  Medication Sig Dispense Refill   buPROPion (WELLBUTRIN SR) 150 MG 12 hr tablet Take 150 mg by mouth daily.     busPIRone (BUSPAR) 10 MG tablet Take 10 mg by mouth 2 (two) times daily.     cyanocobalamin (,VITAMIN B-12,) 1000 MCG/ML injection Inject 1 ml under the skin daily for 1 week then 1 ml weekly for 4 weeks then 1 ml monthly 13 mL 0   hydrOXYzine (VISTARIL) 25 MG capsule Take 50 mg by mouth 2 (two) times daily.     ibuprofen (ADVIL,MOTRIN) 800 MG tablet Take 1 tablet (800 mg total) by mouth 3 (three) times daily. (Patient not taking: Reported  on 05/27/2019) 21 tablet 0   megestrol (MEGACE) 40 MG tablet 3 tabs a day for 5 days(all at once) 2 tabs a day for 5 days(all at once) then 1 tablet a day 45 tablet 0   naproxen (NAPROSYN) 375 MG tablet Take 1 tablet (375 mg total) by mouth 2 (two) times daily with a meal. 20 tablet 0   SYRINGE-NEEDLE, DISP, 3 ML (LUER LOCK SAFETY SYRINGES) 25G X 5/8" 3 ML MISC To use for SQ administration of cyanocobalamin 15 each 1   tranexamic acid (LYSTEDA) 650 MG TABS tablet Take 2 tablets 3 times a day for 5 days , 2 tablets 2 times a day for 5 days, and then 1 tablet daily. 45 tablet 0   valACYclovir (VALTREX) 500 MG tablet Take 500 mg by mouth 2 (two) times daily. (Patient not taking: Reported on 10/29/2021)     VYVANSE 50 MG capsule Take 50 mg by mouth every morning.     No current facility-administered medications for this visit.     PHYSICAL  EXAMINATION: ECOG PERFORMANCE STATUS: 0 - Asymptomatic  There were no vitals filed for this visit.  There were no vitals filed for this visit.   Physical Exam Constitutional:      Appearance: Normal appearance.  Cardiovascular:     Rate and Rhythm: Normal rate and regular rhythm.  Abdominal:     General: Abdomen is flat. Bowel sounds are normal.     Palpations: Abdomen is soft.  Musculoskeletal:        General: No swelling or tenderness. Normal range of motion.     Cervical back: Normal range of motion and neck supple.  Skin:    General: Skin is warm and dry.  Neurological:     General: No focal deficit present.     Mental Status: She is alert.    LABORATORY DATA:  I have reviewed the data as listed Lab Results  Component Value Date   WBC 5.5 11/01/2021   HGB 8.5 (L) 11/01/2021   HCT 28.9 (L) 11/01/2021   HCT 29.0 (L) 11/01/2021   MCV 62.3 (L) 11/01/2021   PLT 340 11/01/2021     Chemistry      Component Value Date/Time   NA 141 10/29/2021 0230   K 3.5 10/29/2021 0230   CL 111 10/29/2021 0230   CO2 21 (L) 10/29/2021 0230   BUN 14 10/29/2021 0230   CREATININE 0.72 10/29/2021 0230      Component Value Date/Time   CALCIUM 8.5 (L) 10/29/2021 0230   ALKPHOS 64 10/29/2021 0230   AST 18 10/29/2021 0230   ALT 13 10/29/2021 0230   BILITOT 0.4 10/29/2021 0230       RADIOGRAPHIC STUDIES: I have personally reviewed the radiological images as listed and agreed with the findings in the report. No results found.  All questions were answered. The patient knows to call the clinic with any problems, questions or concerns. I spent ** minutes in the care of this patient including H and P, review of records, counseling and coordination of care.     Benay Pike, MD 12/16/2021 8:24 AM

## 2021-12-24 DIAGNOSIS — Z111 Encounter for screening for respiratory tuberculosis: Secondary | ICD-10-CM | POA: Diagnosis not present

## 2021-12-27 DIAGNOSIS — D500 Iron deficiency anemia secondary to blood loss (chronic): Secondary | ICD-10-CM | POA: Diagnosis not present

## 2021-12-29 DIAGNOSIS — F332 Major depressive disorder, recurrent severe without psychotic features: Secondary | ICD-10-CM | POA: Diagnosis not present

## 2021-12-29 DIAGNOSIS — F900 Attention-deficit hyperactivity disorder, predominantly inattentive type: Secondary | ICD-10-CM | POA: Diagnosis not present

## 2021-12-29 DIAGNOSIS — F411 Generalized anxiety disorder: Secondary | ICD-10-CM | POA: Diagnosis not present

## 2022-01-17 DIAGNOSIS — F411 Generalized anxiety disorder: Secondary | ICD-10-CM | POA: Diagnosis not present

## 2022-01-17 DIAGNOSIS — F332 Major depressive disorder, recurrent severe without psychotic features: Secondary | ICD-10-CM | POA: Diagnosis not present

## 2022-01-17 DIAGNOSIS — F900 Attention-deficit hyperactivity disorder, predominantly inattentive type: Secondary | ICD-10-CM | POA: Diagnosis not present

## 2022-02-04 DIAGNOSIS — F411 Generalized anxiety disorder: Secondary | ICD-10-CM | POA: Diagnosis not present

## 2022-02-04 DIAGNOSIS — F332 Major depressive disorder, recurrent severe without psychotic features: Secondary | ICD-10-CM | POA: Diagnosis not present

## 2022-02-04 DIAGNOSIS — F900 Attention-deficit hyperactivity disorder, predominantly inattentive type: Secondary | ICD-10-CM | POA: Diagnosis not present

## 2022-02-19 ENCOUNTER — Ambulatory Visit
Admission: EM | Admit: 2022-02-19 | Discharge: 2022-02-19 | Disposition: A | Discharge status: Home / Self Care | Payer: Medicaid Other

## 2022-02-19 DIAGNOSIS — K047 Periapical abscess without sinus: Secondary | ICD-10-CM

## 2022-02-19 MED ORDER — FLUCONAZOLE 150 MG PO TABS: ORAL_TABLET | ORAL | 0 refills | Status: DC

## 2022-02-19 MED ORDER — AMOXICILLIN-POT CLAVULANATE 875-125 MG PO TABS: 1.0000 | ORAL_TABLET | Freq: Two times a day (BID) | ORAL | 0 refills | Status: AC

## 2022-02-19 NOTE — ED Provider Notes (Addendum)
[facility]    CSN: [id number] Arrival date & time: 02/19/22  3557    HISTORY   Chief Complaint  Patient presents with   Facial Swelling   Facial Pain   HPI Maria Ho is a pleasant, 41 y.o. female who presents to urgent care today. Pt states this morning she woke up and noticed she had some swelling to left side of her face. The patient states she had dental work and a cold last week. The patient denies SOB.  Patient states she has been taking Goody powders with no relief.    Past Medical History:  Diagnosis Date   Anemia    Anxiety    PTSD   Arrhythmia    Arthritis    Depression    Fibroid    Headache(784.0)    Migraines   HSV (herpes simplex virus) infection    no outbreak during current pregnancy   Migraine    Patient Active Problem List   Diagnosis Date Noted   IDA (iron deficiency anemia) 11/01/2021   PTSD (post-traumatic stress disorder) 01/04/2016   Chronic back pain 01/04/2016   MDD (major depressive disorder), recurrent severe, without psychosis ([place]) 01/03/2016   Chronic post-traumatic stress disorder (PTSD) 01/03/2016   Numbness 10/30/2014   Memory disturbance 10/30/2014   Other fatigue 10/30/2014   Anxiety 05/06/2013   Dermatitis, eczematoid 05/06/2013   Genital herpes 05/06/2013   Cannot sleep 05/06/2013   Oral herpes 05/06/2013   Palpitations 06/15/2011   Bleeding in early pregnancy 04/07/2011   Past Surgical History:  Procedure Laterality Date   NO PAST SURGERIES     None     OB History     Gravida  14   Para  4   Term  4   Preterm      AB  10   Living  4      SAB  1   IAB  9   Ectopic      Multiple      Live Births  4          Home Medications    Prior to Admission medications   Medication Sig Start Date End Date Taking? Authorizing Provider  busPIRone (BUSPAR) 10 MG tablet Take 10 mg by mouth 2 (two) times daily. 10/27/21   [provider]  hydrOXYzine (VISTARIL) 25 MG capsule  Take 50 mg by mouth 2 (two) times daily. 10/27/21   [provider]  VYVANSE 50 MG capsule Take 50 mg by mouth every morning. 10/15/21   [provider]    Family History Family History  Problem Relation Age of Onset   Post-traumatic stress disorder Father    Alcohol abuse Father    Drug abuse Father    Healthy Mother    Diabetes Maternal Grandmother    Cancer Paternal Grandmother        Great-Grandparent   Cancer Paternal Grandfather        Great-Gradnparent   Social History Social History   Tobacco Use   Smoking status: Never   Smokeless tobacco: Never  Vaping Use   Vaping Use: Never used  Substance Use Topics   Alcohol use: Yes    Comment: occasionally   Drug use: Yes    Types: Marijuana    Comment: last night   Allergies   Patient has no known allergies.  Review of Systems Review of Systems Pertinent findings revealed after performing a 14 point review of systems has been noted  in the history of present illness.  Physical Exam Triage Vital Signs ED Triage Vitals  Enc Vitals Group     BP 02/26/21 0827 (!) 147/82     Pulse Rate 02/26/21 0827 72     Resp 02/26/21 0827 18     Temp 02/26/21 0827 98.3 F (36.8 C)     Temp Source 02/26/21 0827 Oral     SpO2 02/26/21 0827 98 %     Weight --      Height --      Head Circumference --      Peak Flow --      Pain Score 02/26/21 0826 5     Pain Loc --      Pain Edu? --      Excl. in [place]? --   No data found.  Updated Vital Signs BP (!) 138/94 (BP Location: Left Arm)   Pulse (!) 107   Temp 98.1 F (36.7 C) (Oral)   Resp 16   LMP 02/17/2022   SpO2 97%   Physical Exam Vitals and nursing note reviewed.  Constitutional:      General: She is not in acute distress.    Appearance: Normal appearance. She is not ill-appearing.  HENT:     Head: Normocephalic and atraumatic.     Salivary Glands: Right salivary gland is not diffusely enlarged or tender. Left salivary gland is not diffusely enlarged  or tender.      Right Ear: Hearing, tympanic membrane, ear canal and external ear normal. No drainage. No middle ear effusion. There is no impacted cerumen. Tympanic membrane is not erythematous or bulging.     Left Ear: Hearing, tympanic membrane, ear canal and external ear normal. No drainage.  No middle ear effusion. There is no impacted cerumen. Tympanic membrane is not erythematous or bulging.     Nose: No nasal deformity, septal deviation, mucosal edema, congestion or rhinorrhea.     Right Nostril: No foreign body, epistaxis, septal hematoma or occlusion.     Left Nostril: No foreign body, epistaxis, septal hematoma or occlusion.     Right Turbinates: Not enlarged, swollen or pale.     Left Turbinates: Enlarged and swollen. Not pale.     Right Sinus: No maxillary sinus tenderness or frontal sinus tenderness.     Left Sinus: Maxillary sinus tenderness present. No frontal sinus tenderness.     Mouth/Throat:     Lips: Pink. No lesions.     Mouth: Mucous membranes are moist. No oral lesions.     Dentition: Dental caries present.     Pharynx: Oropharynx is clear. Uvula midline. No pharyngeal swelling, oropharyngeal exudate, posterior oropharyngeal erythema or uvula swelling.     Tonsils: No tonsillar exudate or tonsillar abscesses. 0 on the right. 0 on the left.   Eyes:     General: Lids are normal.        Right eye: No discharge.        Left eye: No discharge.     Extraocular Movements: Extraocular movements intact.     Conjunctiva/sclera: Conjunctivae normal.     Right eye: Right conjunctiva is not injected.     Left eye: Left conjunctiva is not injected.  Neck:     Trachea: Trachea and phonation normal.  Cardiovascular:     Rate and Rhythm: Normal rate and regular rhythm.     Pulses: Normal pulses.     Heart sounds: Normal heart sounds. No murmur heard.    No friction  rub. No gallop.  Pulmonary:     Effort: Pulmonary effort is normal. No accessory muscle usage, prolonged  expiration or respiratory distress.     Breath sounds: Normal breath sounds. No stridor, decreased air movement or transmitted upper airway sounds. No decreased breath sounds, wheezing, rhonchi or rales.  Chest:     Chest wall: No tenderness.  Musculoskeletal:        General: Normal range of motion.     Cervical back: Normal range of motion and neck supple. Normal range of motion.  Lymphadenopathy:     Cervical: No cervical adenopathy.  Skin:    General: Skin is warm and dry.     Findings: No erythema or rash.  Neurological:     General: No focal deficit present.     Mental Status: She is alert and oriented to person, place, and time.  Psychiatric:        Mood and Affect: Mood normal.        Behavior: Behavior normal.     Visual Acuity Right Eye Distance:   Left Eye Distance:   Bilateral Distance:    Right Eye Near:   Left Eye Near:    Bilateral Near:     UC Couse / Diagnostics / Procedures:     Radiology No results found.  Procedures Procedures (including critical care time) EKG  Pending results:  Labs Reviewed - No data to display  Medications Ordered in UC: Medications - No data to display  UC Diagnoses / Final Clinical Impressions(s)   I have reviewed the triage vital signs and the nursing notes.  Pertinent labs & imaging results that were available during my care of the patient were reviewed by me and considered in my medical decision making (see chart for details).    Final diagnoses:  Migratory dental abscess   Based on physical exam findings and history provided by patient, believe that she has an odontogenic infection that is migrated to the perioral space.  Patient will be treated with 10 days of Augmentin.  A prescription for Diflucan was provided to the patient for possible antibiotic induced vaginal yeast infection.  Return precautions advised.  Emergency precautions advised.  ED Prescriptions     Medication Sig Dispense Auth. Provider    amoxicillin-clavulanate (AUGMENTIN) 875-125 MG tablet Take 1 tablet by mouth 2 (two) times daily for 7 days. 14 tablet Lynden Oxford Scales, PA-C   fluconazole (DIFLUCAN) 150 MG tablet Take 1 tablet today.  Take second tablet 3 days later. 2 tablet Lynden Oxford Scales, PA-C      PDMP not reviewed this encounter.  Pending results:  Labs Reviewed - No data to display  Discharge Instructions:   Discharge Instructions      Please read below to learn more about the medications, dosages and frequencies that I recommend to help alleviate your symptoms and to get you feeling better soon:   Augmentin (amoxicillin - clavulanic acid):  take 1 tablet twice daily for 10 days, you can take it with or without food.  This antibiotic can cause upset stomach, this will resolve once antibiotics are complete.  You are welcome to use a probiotic, eat yogurt, take Imodium while taking this medication.  Please avoid other systemic medications such as Maalox, Pepto-Bismol or milk of magnesia as they can interfere with your body's ability to absorb the antibiotics.      As you may or may not be aware, taking antibiotics can often cause patients to develop a vaginal  yeast infection.  For this reason, I have provided you with a prescription for Diflucan, and antifungal medication used to treat vaginal yeast infections.  Please take the first Diflucan tablet on day 3 or 4 of your antibiotic therapy, and take the second Diflucan tablet 3 days later.  You do not need to pick up this prescription or take this medication unless you develop symptoms of vaginal yeast infection including thick, white vaginal discharge and/or vaginal itching.  This prescription has been provided as a Manufacturing engineer and for your convenience.  Please follow-up within the next 7-10 days either with your primary care provider or urgent care if your symptoms do not resolve.  If you do not have a primary care provider, we will assist you in finding one.   Follow-up sooner if you have worsening symptoms despite antibiotic treatment.        Thank you for visiting urgent care today.  We appreciate the opportunity to participate in your care.         Disposition Upon Discharge:  Condition: stable for discharge home  Patient presented with an acute illness with associated systemic symptoms and significant discomfort requiring urgent management. In my opinion, this is a condition that a prudent lay person (someone who possesses an average knowledge of health and medicine) may potentially expect to result in complications if not addressed urgently such as respiratory distress, impairment of bodily function or dysfunction of bodily organs.   Routine symptom specific, illness specific and/or disease specific instructions were discussed with the patient and/or caregiver at length.   As such, the patient has been evaluated and assessed, work-up was performed and treatment was provided in alignment with urgent care protocols and evidence based medicine.  Patient/parent/caregiver has been advised that the patient may require follow up for further testing and treatment if the symptoms continue in spite of treatment, as clinically indicated and appropriate.  Patient/parent/caregiver has been advised to return to the [facility] or PCP if no better; to PCP or the Emergency Department if new signs and symptoms develop, or if the current signs or symptoms continue to change or worsen for further workup, evaluation and treatment as clinically indicated and appropriate  The patient will follow up with their current PCP if and as advised. If the patient does not currently have a PCP we will assist them in obtaining one.   The patient may need specialty follow up if the symptoms continue, in spite of conservative treatment and management, for further workup, evaluation, consultation and treatment as clinically indicated and appropriate.   Patient/parent/caregiver  verbalized understanding and agreement of plan as discussed.  All questions were addressed during visit.  Please see discharge instructions below for further details of plan.  This office note has been dictated using Museum/gallery curator.  Unfortunately, this method of dictation can sometimes lead to typographical or grammatical errors.  I apologize for your inconvenience in advance if this occurs.  Please do not hesitate to reach out to me if clarification is needed.      Lynden Oxford Scales, PA-C 02/19/22 1610    Lynden Oxford Scales, PA-C 02/19/22 [phone]

## 2022-02-19 NOTE — Discharge Instructions (Signed)
Please read below to learn more about the medications, dosages and frequencies that I recommend to help alleviate your symptoms and to get you feeling better soon:   Augmentin (amoxicillin - clavulanic acid):  take 1 tablet twice daily for 10 days, you can take it with or without food.  This antibiotic can cause upset stomach, this will resolve once antibiotics are complete.  You are welcome to use a probiotic, eat yogurt, take Imodium while taking this medication.  Please avoid other systemic medications such as Maalox, Pepto-Bismol or milk of magnesia as they can interfere with your body's ability to absorb the antibiotics.      As you may or may not be aware, taking antibiotics can often cause patients to develop a vaginal yeast infection.  For this reason, I have provided you with a prescription for Diflucan, and antifungal medication used to treat vaginal yeast infections.  Please take the first Diflucan tablet on day 3 or 4 of your antibiotic therapy, and take the second Diflucan tablet 3 days later.  You do not need to pick up this prescription or take this medication unless you develop symptoms of vaginal yeast infection including thick, white vaginal discharge and/or vaginal itching.  This prescription has been provided as a Manufacturing engineer and for your convenience.  Please follow-up within the next 7-10 days either with your primary care provider or urgent care if your symptoms do not resolve.  If you do not have a primary care provider, we will assist you in finding one.  Follow-up sooner if you have worsening symptoms despite antibiotic treatment.        Thank you for visiting urgent care today.  We appreciate the opportunity to participate in your care.

## 2022-02-19 NOTE — ED Triage Notes (Signed)
Pt states this morning she woke up and noticed she had some swelling to left side of her face. The patient states she had dental work and a sinus infection last week. The patient denies SOB.  Home interventions: goody powders

## 2022-03-09 DIAGNOSIS — F900 Attention-deficit hyperactivity disorder, predominantly inattentive type: Secondary | ICD-10-CM | POA: Diagnosis not present

## 2022-03-09 DIAGNOSIS — F332 Major depressive disorder, recurrent severe without psychotic features: Secondary | ICD-10-CM | POA: Diagnosis not present

## 2022-03-09 DIAGNOSIS — F411 Generalized anxiety disorder: Secondary | ICD-10-CM | POA: Diagnosis not present

## 2022-03-13 ENCOUNTER — Ambulatory Visit
Admission: EM | Admit: 2022-03-13 | Discharge: 2022-03-13 | Disposition: A | Discharge status: Home / Self Care | Payer: Medicaid Other | Attending: Urgent Care | Admitting: Urgent Care

## 2022-03-13 ENCOUNTER — Encounter: Payer: Self-pay | Admitting: Emergency Medicine

## 2022-03-13 DIAGNOSIS — R519 Headache, unspecified: Secondary | ICD-10-CM

## 2022-03-13 DIAGNOSIS — R220 Localized swelling, mass and lump, head: Secondary | ICD-10-CM | POA: Diagnosis not present

## 2022-03-13 MED ORDER — AMOXICILLIN-POT CLAVULANATE 875-125 MG PO TABS: 1.0000 | ORAL_TABLET | Freq: Two times a day (BID) | ORAL | 0 refills | Status: DC

## 2022-03-13 NOTE — ED Triage Notes (Signed)
Pt had facial swelling and seen here 10/21 was told be given 10 day antibiotics but only given 7 day. Felt still had swelling after 7 day treatment so called PCP who told needed to be seen. Pt having swelling to left side of face again with pain.

## 2022-03-13 NOTE — ED Provider Notes (Signed)
[facility]  Note:  This document was prepared using Systems analyst and may include unintentional dictation errors.  MRN: [id number] DOB: 1980-05-25  Subjective:   Maria Ho is a 41 y.o. female presenting for antibiotic refill. Patient was seen 02/19/2022 and per patient was advised that she would get a 10-day course of Augmentin.  Per patient the suspicion was that she had a sinus infection but not a dental infection.  She did not contact her dental specialist.  She finished the 1 week course of Augmentin and had some improvement but not resolution.  Patient is now requesting a 10-day course.  No runny or stuffy nose, sinus headache, ear pain, ear drainage, overt dental pain, gum pain.  Her primary concern is that she continues to have facial swelling directly over the left lower part of her cheek but not over the mandible.  Has a known history of teeth that need work over that side.  Has been advised that she needs a dental extraction as well as wisdom tooth extraction.  She has put this off due to financial restraints and a demanding work schedule.  No current facility-administered medications for this encounter.  Current Outpatient Medications:    busPIRone (BUSPAR) 10 MG tablet, Take 10 mg by mouth 2 (two) times daily., Disp: , Rfl:    fluconazole (DIFLUCAN) 150 MG tablet, Take 1 tablet today.  Take second tablet 3 days later., Disp: 2 tablet, Rfl: 0   hydrOXYzine (VISTARIL) 25 MG capsule, Take 50 mg by mouth 2 (two) times daily., Disp: , Rfl:    terbinafine (LAMISIL) 250 MG tablet, Take 250 mg by mouth daily., Disp: , Rfl:    VYVANSE 50 MG capsule, Take 50 mg by mouth every morning., Disp: , Rfl:    No Known Allergies  Past Medical History:  Diagnosis Date   Anemia    Anxiety    PTSD   Arrhythmia    Arthritis    Depression    Fibroid    Headache(784.0)    Migraines   HSV (herpes simplex virus) infection    no outbreak during  current pregnancy   Migraine      Past Surgical History:  Procedure Laterality Date   NO PAST SURGERIES     None      Family History  Problem Relation Age of Onset   Post-traumatic stress disorder Father    Alcohol abuse Father    Drug abuse Father    Healthy Mother    Diabetes Maternal Grandmother    Cancer Paternal Grandmother        Great-Grandparent   Cancer Paternal Grandfather        Great-Gradnparent    Social History   Tobacco Use   Smoking status: Never   Smokeless tobacco: Never  Vaping Use   Vaping Use: Never used  Substance Use Topics   Alcohol use: Yes    Comment: occasionally   Drug use: Yes    Types: Marijuana    Comment: last night    ROS   Objective:   Vitals: BP (!) 129/91 (BP Location: Left Arm)   Pulse (!) 110   Temp 98.9 F (37.2 C) (Oral)   Resp 15   LMP 02/17/2022   SpO2 98%   Physical Exam Constitutional:      General: She is not in acute distress.    Appearance: Normal appearance. She is well-developed. She is not ill-appearing, toxic-appearing or diaphoretic.  HENT:  Head: Normocephalic and atraumatic.     Nose: Nose normal. No congestion or rhinorrhea.     Comments: No sinus tenderness.    Mouth/Throat:     Mouth: Mucous membranes are moist.   Eyes:     General: No scleral icterus.       Right eye: No discharge.        Left eye: No discharge.     Extraocular Movements: Extraocular movements intact.  Cardiovascular:     Rate and Rhythm: Normal rate.  Pulmonary:     Effort: Pulmonary effort is normal.  Skin:    General: Skin is warm and dry.  Neurological:     General: No focal deficit present.     Mental Status: She is alert and oriented to person, place, and time.  Psychiatric:        Mood and Affect: Mood normal.        Behavior: Behavior normal.     Assessment and Plan :   PDMP not reviewed this encounter.  1. Facial pain   2. Facial swelling     Primary suspicion is that her facial swelling is  related to dental infection.  I provided her with another prescription for Augmentin for 10 days.  Emphasized need to set up an appointment for consultation with her dental specialist.  Low suspicion for a sinus infection as she has no sinus symptoms. Counseled patient on potential for adverse effects with medications prescribed/recommended today, ER and return-to-clinic precautions discussed, patient verbalized understanding.    Jaynee Eagles, PA-C 03/13/22 1544

## 2022-03-22 ENCOUNTER — Ambulatory Visit
Admission: EM | Admit: 2022-03-22 | Discharge: 2022-03-22 | Disposition: A | Discharge status: Home / Self Care | Payer: Medicaid Other | Attending: Urgent Care | Admitting: Urgent Care

## 2022-03-22 DIAGNOSIS — L259 Unspecified contact dermatitis, unspecified cause: Secondary | ICD-10-CM

## 2022-03-22 DIAGNOSIS — L0292 Furuncle, unspecified: Secondary | ICD-10-CM | POA: Diagnosis not present

## 2022-03-22 MED ORDER — TRIAMCINOLONE ACETONIDE 0.1 % EX CREA: 1.0000 | TOPICAL_CREAM | Freq: Two times a day (BID) | CUTANEOUS | 0 refills | Status: DC

## 2022-03-22 NOTE — ED Triage Notes (Signed)
Reports spider bite on right lower leg. Pt noticed it this morning. Reports swelling and blistering.

## 2022-03-22 NOTE — ED Provider Notes (Signed)
[facility]  Note:  This document was prepared using Systems analyst and may include unintentional dictation errors.  MRN: [id number] DOB: 1980-10-01  Subjective:   Maria Ho is a 41 y.o. female presenting for acute onset this morning of 2 lesions over the right lower leg.  Suspects that they were bug bites.  They are slightly itchy.  She has had 1 that looks like a boil.  No tenderness.  She has used warm compresses which got the area to drain but has filled back up.  No current facility-administered medications for this encounter.  Current Outpatient Medications:    amoxicillin-clavulanate (AUGMENTIN) 875-125 MG tablet, Take 1 tablet by mouth 2 (two) times daily., Disp: 20 tablet, Rfl: 0   busPIRone (BUSPAR) 10 MG tablet, Take 10 mg by mouth 2 (two) times daily., Disp: , Rfl:    fluconazole (DIFLUCAN) 150 MG tablet, Take 1 tablet today.  Take second tablet 3 days later. (Patient not taking: Reported on 03/22/2022), Disp: 2 tablet, Rfl: 0   hydrOXYzine (VISTARIL) 25 MG capsule, Take 50 mg by mouth 2 (two) times daily., Disp: , Rfl:    terbinafine (LAMISIL) 250 MG tablet, Take 250 mg by mouth daily., Disp: , Rfl:    VYVANSE 50 MG capsule, Take 50 mg by mouth every morning., Disp: , Rfl:    No Known Allergies  Past Medical History:  Diagnosis Date   Anemia    Anxiety    PTSD   Arrhythmia    Arthritis    Depression    Fibroid    Headache(784.0)    Migraines   HSV (herpes simplex virus) infection    no outbreak during current pregnancy   Migraine      Past Surgical History:  Procedure Laterality Date   NO PAST SURGERIES     None      Family History  Problem Relation Age of Onset   Post-traumatic stress disorder Father    Alcohol abuse Father    Drug abuse Father    Healthy Mother    Diabetes Maternal Grandmother    Cancer Paternal Grandmother        Great-Grandparent   Cancer Paternal Grandfather         Great-Gradnparent    Social History   Tobacco Use   Smoking status: Never   Smokeless tobacco: Never  Vaping Use   Vaping Use: Never used  Substance Use Topics   Alcohol use: Yes    Comment: occasionally   Drug use: Yes    Types: Marijuana    Comment: last night    ROS   Objective:   Vitals: BP (!) 140/94 (BP Location: Left Arm)   Pulse 99   Temp 98.3 F (36.8 C) (Oral)   Resp 20   LMP 03/10/2022 (Approximate)   SpO2 99%   Physical Exam Constitutional:      General: She is not in acute distress.    Appearance: Normal appearance. She is well-developed. She is not ill-appearing, toxic-appearing or diaphoretic.  HENT:     Head: Normocephalic and atraumatic.     Nose: Nose normal.     Mouth/Throat:     Mouth: Mucous membranes are moist.  Eyes:     General: No scleral icterus.       Right eye: No discharge.        Left eye: No discharge.     Extraocular Movements: Extraocular movements intact.  Cardiovascular:  Rate and Rhythm: Normal rate.  Pulmonary:     Effort: Pulmonary effort is normal.  Skin:    General: Skin is warm and dry.       Neurological:     General: No focal deficit present.     Mental Status: She is alert and oriented to person, place, and time.  Psychiatric:        Mood and Affect: Mood normal.        Behavior: Behavior normal.     Assessment and Plan :   PDMP not reviewed this encounter.  1. Contact dermatitis, unspecified contact dermatitis type, unspecified trigger   2. Boil     Recommended topical steroids for contact dermatitis.  Reviewed wound care otherwise.  No signs of infection at this time. Counseled patient on potential for adverse effects with medications prescribed/recommended today, ER and return-to-clinic precautions discussed, patient verbalized understanding.    Jaynee Eagles, PA-C 03/22/22 1958

## 2022-04-05 ENCOUNTER — Encounter (HOSPITAL_BASED_OUTPATIENT_CLINIC_OR_DEPARTMENT_OTHER): Payer: Self-pay

## 2022-04-05 ENCOUNTER — Emergency Department (HOSPITAL_BASED_OUTPATIENT_CLINIC_OR_DEPARTMENT_OTHER)
Admission: EM | Admit: 2022-04-05 | Discharge: 2022-04-05 | Disposition: A | Discharge status: Home / Self Care | Payer: Medicaid Other | Attending: Emergency Medicine | Admitting: Emergency Medicine

## 2022-04-05 ENCOUNTER — Other Ambulatory Visit: Payer: Self-pay

## 2022-04-05 DIAGNOSIS — R519 Headache, unspecified: Secondary | ICD-10-CM | POA: Diagnosis present

## 2022-04-05 DIAGNOSIS — G43809 Other migraine, not intractable, without status migrainosus: Secondary | ICD-10-CM | POA: Insufficient documentation

## 2022-04-05 MED ORDER — DIPHENHYDRAMINE HCL 50 MG/ML IJ SOLN
25.0000 mg | Freq: Once | INTRAMUSCULAR | Status: AC
  Administered 2022-04-05: 25 mg via INTRAVENOUS
  Filled 2022-04-05: qty 1

## 2022-04-05 MED ORDER — SODIUM CHLORIDE 0.9 % IV BOLUS
1000.0000 mL | Freq: Once | INTRAVENOUS | Status: AC
  Administered 2022-04-05: 1000 mL via INTRAVENOUS

## 2022-04-05 MED ORDER — PROCHLORPERAZINE EDISYLATE 10 MG/2ML IJ SOLN
10.0000 mg | Freq: Once | INTRAMUSCULAR | Status: AC
  Administered 2022-04-05: 10 mg via INTRAVENOUS
  Filled 2022-04-05: qty 2

## 2022-04-05 MED ORDER — KETOROLAC TROMETHAMINE 30 MG/ML IJ SOLN
30.0000 mg | Freq: Once | INTRAMUSCULAR | Status: AC
  Administered 2022-04-05: 30 mg via INTRAVENOUS
  Filled 2022-04-05: qty 1

## 2022-04-05 NOTE — ED Provider Notes (Signed)
[facility] EMERGENCY DEPT  Provider Note  CSN: [id number] Arrival date & time: 04/05/22 0334  History Chief Complaint  Patient presents with   Headache    Maria Ho is a 41 y.o. female with history of Migraines reports she has had a headache off and on for the last few days. Started in the back and wrapped around both sides. Throbbing, associated with some nausea. No photophobia. Typically she is able to manage her headaches at home but her usual remedies have not been working. No fever, URI symptoms. No blurry vision, numbness, weakness or difficulty walking.    Home Medications Prior to Admission medications   Medication Sig Start Date End Date Taking? Authorizing Provider  amoxicillin-clavulanate (AUGMENTIN) 875-125 MG tablet Take 1 tablet by mouth 2 (two) times daily. 03/13/22   Jaynee Eagles, PA-C  busPIRone (BUSPAR) 10 MG tablet Take 10 mg by mouth 2 (two) times daily. 10/27/21   [provider]  fluconazole (DIFLUCAN) 150 MG tablet Take 1 tablet today.  Take second tablet 3 days later. Patient not taking: Reported on 03/22/2022 02/19/22   Lynden Oxford Scales, PA-C  hydrOXYzine (VISTARIL) 25 MG capsule Take 50 mg by mouth 2 (two) times daily. 10/27/21   [provider]  terbinafine (LAMISIL) 250 MG tablet Take 250 mg by mouth daily.    [provider]  triamcinolone cream (KENALOG) 0.1 % Apply 1 Application topically 2 (two) times daily. 03/22/22   Jaynee Eagles, PA-C  VYVANSE 50 MG capsule Take 50 mg by mouth every morning. 10/15/21   [provider]     Allergies    Patient has no known allergies.   Review of Systems   Review of Systems Please see HPI for pertinent positives and negatives  Physical Exam BP (!) 140/78   Pulse 85   Temp 98.6 F (37 C) (Oral)   Resp 15   Ht '5\' 9"'$  (1.753 m)   Wt 93 kg   LMP 03/10/2022 (Approximate)   SpO2 99%   BMI 30.27 kg/m   Physical Exam Vitals and nursing note  reviewed.  Constitutional:      Appearance: Normal appearance.  HENT:     Head: Normocephalic and atraumatic.     Nose: Nose normal.     Mouth/Throat:     Mouth: Mucous membranes are moist.  Eyes:     Extraocular Movements: Extraocular movements intact.     Conjunctiva/sclera: Conjunctivae normal.  Cardiovascular:     Rate and Rhythm: Normal rate.  Pulmonary:     Effort: Pulmonary effort is normal.     Breath sounds: Normal breath sounds.  Abdominal:     General: Abdomen is flat.     Palpations: Abdomen is soft.     Tenderness: There is no abdominal tenderness.  Musculoskeletal:        General: No swelling. Normal range of motion.     Cervical back: Neck supple.  Skin:    General: Skin is warm and dry.  Neurological:     General: No focal deficit present.     Mental Status: She is alert and oriented to person, place, and time.     Cranial Nerves: No cranial nerve deficit.     Sensory: No sensory deficit.     Motor: No weakness.     Gait: Gait normal.  Psychiatric:        Mood and Affect: Mood normal.     ED Results / Procedures / Treatments   EKG  None  Procedures Procedures  Medications Ordered in the ED Medications  sodium chloride 0.9 % bolus 1,000 mL (0 mLs Intravenous Stopped 04/05/22 0606)  ketorolac (TORADOL) 30 MG/ML injection 30 mg (30 mg Intravenous Given 04/05/22 0444)  prochlorperazine (COMPAZINE) injection 10 mg (10 mg Intravenous Given 04/05/22 0443)  diphenhydrAMINE (BENADRYL) injection 25 mg (25 mg Intravenous Given 04/05/22 0518)    Initial Impression and Plan  Patient here with headache, she reports different from her usual migraine but does not have any concerning features for ICH/SAH or venous thrombosis. Will give IVF and headache cocktail and reassess.   ED Course   Clinical Course as of 04/05/22 0622  Tue Apr 05, 2022  0508 Patient reports some improvement. States she typically gets benadryl too. Added that to her meds.  [CS]  [id number]  Patient's headache is resolved and she is ready go to home. Recommend she continue with her typical home headache management. PCP follow up. RTED for any other concerns.  [CS]    Clinical Course User Index [CS] Truddie Hidden, MD     MDM Rules/Calculators/A&P Medical Decision Making Problems Addressed: Other migraine without status migrainosus, not intractable: acute illness or injury  Risk Prescription drug management.    Final Clinical Impression(s) / ED Diagnoses Final diagnoses:  Other migraine without status migrainosus, not intractable    Rx / DC Orders ED Discharge Orders     None        Truddie Hidden, MD 04/05/22 [phone]

## 2022-04-05 NOTE — ED Triage Notes (Signed)
Headache intermittently over last two days, but restarted and became worse around 4pm yesterday.  States her headache is not like her typical migraines.

## 2022-04-06 ENCOUNTER — Telehealth: Payer: Self-pay | Admitting: Obstetrics and Gynecology

## 2022-04-06 DIAGNOSIS — M9901 Segmental and somatic dysfunction of cervical region: Secondary | ICD-10-CM | POA: Diagnosis not present

## 2022-04-06 DIAGNOSIS — M5382 Other specified dorsopathies, cervical region: Secondary | ICD-10-CM | POA: Diagnosis not present

## 2022-04-06 NOTE — Patient Outreach (Signed)
Care Coordination  04/06/2022  MARVEEN DONLON [birth year] [id number]   Transition Care Management Follow-up Telephone Call Date of discharge and from where: 04/05/22 from [facility] How have you been since you were released from the hospital? Feels better now Any questions or concerns? No  Items Reviewed: Did the pt receive and understand the discharge instructions provided? Yes  Medications obtained and verified?  No medications ordered for patient Other? No  Any new allergies since your discharge? No  Dietary orders reviewed? No Do you have support at home? Yes   Home Care and Equipment/Supplies: Were home health services ordered? not applicable If so, what is the name of the agency? N/A  Has the agency set up a time to come to the patient's home? not applicable Were any new equipment or medical supplies ordered?  No What is the name of the medical supply agency? N/A Were you able to get the supplies/equipment? not applicable Do you have any questions related to the use of the equipment or supplies? No  Functional Questionnaire: (I = Independent and D = Dependent) ADLs: I  Bathing/Dressing- I  Meal Prep- I  Eating- I  Maintaining continence- I  Transferring/Ambulation- I  Managing Meds- I  Follow up appointments reviewed:  PCP Hospital f/u appt confirmed? No Patient seen by her Chiropractor today and feels better, Dr. Teryl Lucy Are transportation arrangements needed? No  If their condition worsens, is the pt aware to call PCP or go to the Emergency Dept.? Yes Was the patient provided with contact information for the PCP's office or ED? Yes Was to pt encouraged to call back with questions or concerns? Yes

## 2022-04-13 ENCOUNTER — Ambulatory Visit
Admission: EM | Admit: 2022-04-13 | Discharge: 2022-04-13 | Disposition: A | Discharge status: Home / Self Care | Payer: Medicaid Other | Attending: Urgent Care | Admitting: Urgent Care

## 2022-04-13 DIAGNOSIS — K047 Periapical abscess without sinus: Secondary | ICD-10-CM | POA: Diagnosis not present

## 2022-04-13 MED ORDER — AMOXICILLIN-POT CLAVULANATE 875-125 MG PO TABS: 1.0000 | ORAL_TABLET | Freq: Two times a day (BID) | ORAL | 0 refills | Status: DC

## 2022-04-13 NOTE — ED Triage Notes (Signed)
Pt states that she still has some dental pain. Pt states that she has an appt next week.

## 2022-04-13 NOTE — ED Provider Notes (Signed)
[facility] URG[facility]  Note:  This document was prepared using Systems analyst and may include unintentional dictation errors.  MRN: [id number] DOB: 1980-07-30  Subjective:   Maria Ho is a 41 y.o. female presenting for persistent dental pain that radiates to left ear.  Patient has her appointment coming up with the dental specialist for procedural removal.  Denies fever, drainage, facial swelling.  Her tooth has cracked and partially falling out.  No current facility-administered medications for this encounter.  Current Outpatient Medications:    amoxicillin-clavulanate (AUGMENTIN) 875-125 MG tablet, Take 1 tablet by mouth 2 (two) times daily., Disp: 20 tablet, Rfl: 0   busPIRone (BUSPAR) 10 MG tablet, Take 10 mg by mouth 2 (two) times daily., Disp: , Rfl:    fluconazole (DIFLUCAN) 150 MG tablet, Take 1 tablet today.  Take second tablet 3 days later., Disp: 2 tablet, Rfl: 0   hydrOXYzine (VISTARIL) 25 MG capsule, Take 50 mg by mouth 2 (two) times daily., Disp: , Rfl:    terbinafine (LAMISIL) 250 MG tablet, Take 250 mg by mouth daily., Disp: , Rfl:    triamcinolone cream (KENALOG) 0.1 %, Apply 1 Application topically 2 (two) times daily., Disp: 30 g, Rfl: 0   VYVANSE 50 MG capsule, Take 50 mg by mouth every morning., Disp: , Rfl:    No Known Allergies  Past Medical History:  Diagnosis Date   Anemia    Anxiety    PTSD   Arrhythmia    Arthritis    Depression    Fibroid    Headache(784.0)    Migraines   HSV (herpes simplex virus) infection    no outbreak during current pregnancy   Migraine      Past Surgical History:  Procedure Laterality Date   NO PAST SURGERIES     None      Family History  Problem Relation Age of Onset   Post-traumatic stress disorder Father    Alcohol abuse Father    Drug abuse Father    Healthy Mother    Diabetes Maternal Grandmother    Cancer Paternal Grandmother        Great-Grandparent   Cancer  Paternal Grandfather        Great-Gradnparent    Social History   Tobacco Use   Smoking status: Never   Smokeless tobacco: Never  Vaping Use   Vaping Use: Never used  Substance Use Topics   Alcohol use: Yes    Comment: occasionally   Drug use: Yes    Types: Marijuana    Comment: 2 days ago    ROS   Objective:   Vitals: BP (!) 151/81 (BP Location: Right Arm)   Pulse (!) 109   Temp 98.4 F (36.9 C) (Oral)   Resp 18   Ht '5\' 9"'$  (1.753 m)   Wt 205 lb (93 kg)   LMP 03/10/2022 (Approximate)   SpO2 99%   BMI 30.27 kg/m   Physical Exam Constitutional:      General: She is not in acute distress.    Appearance: Normal appearance. She is well-developed. She is not ill-appearing, toxic-appearing or diaphoretic.  HENT:     Head: Normocephalic and atraumatic.     Nose: Nose normal.     Mouth/Throat:     Mouth: Mucous membranes are moist.   Eyes:     General: No scleral icterus.       Right eye: No discharge.  Left eye: No discharge.     Extraocular Movements: Extraocular movements intact.  Cardiovascular:     Rate and Rhythm: Normal rate.  Pulmonary:     Effort: Pulmonary effort is normal.  Skin:    General: Skin is warm and dry.  Neurological:     General: No focal deficit present.     Mental Status: She is alert and oriented to person, place, and time.  Psychiatric:        Mood and Affect: Mood normal.        Behavior: Behavior normal.        Thought Content: Thought content normal.        Judgment: Judgment normal.     Assessment and Plan :   PDMP not reviewed this encounter.  1. Dental infection     Will use one more round of Augmentin for dental infection/abscess, use naproxen for pain and inflammation.  Keep appointment with a dental specialist. Counseled patient on potential for adverse effects with medications prescribed/recommended today, strict ER and return-to-clinic precautions discussed, patient verbalized understanding.    Jaynee Eagles, [place] 04/13/22 1955

## 2022-04-20 ENCOUNTER — Encounter (HOSPITAL_BASED_OUTPATIENT_CLINIC_OR_DEPARTMENT_OTHER): Payer: Self-pay

## 2022-04-20 ENCOUNTER — Other Ambulatory Visit: Payer: Self-pay

## 2022-04-20 ENCOUNTER — Ambulatory Visit
Admission: EM | Admit: 2022-04-20 | Discharge: 2022-04-20 | Disposition: A | Discharge status: Home / Self Care | Payer: Medicaid Other

## 2022-04-20 DIAGNOSIS — K029 Dental caries, unspecified: Secondary | ICD-10-CM | POA: Insufficient documentation

## 2022-04-20 DIAGNOSIS — R000 Tachycardia, unspecified: Secondary | ICD-10-CM

## 2022-04-20 DIAGNOSIS — K0889 Other specified disorders of teeth and supporting structures: Secondary | ICD-10-CM | POA: Diagnosis not present

## 2022-04-20 DIAGNOSIS — R9431 Abnormal electrocardiogram [ECG] [EKG]: Secondary | ICD-10-CM | POA: Diagnosis not present

## 2022-04-20 LAB — CBC
HCT: 30.2 % — ABNORMAL LOW (ref 36.0–46.0)
Hemoglobin: 8.9 g/dL — ABNORMAL LOW (ref 12.0–15.0)
MCH: 19.9 pg — ABNORMAL LOW (ref 26.0–34.0)
MCHC: 29.5 g/dL — ABNORMAL LOW (ref 30.0–36.0)
MCV: 67.4 fL — ABNORMAL LOW (ref 80.0–100.0)
Platelets: 362 10*3/uL (ref 150–400)
RBC: 4.48 MIL/uL (ref 3.87–5.11)
RDW: 18.9 % — ABNORMAL HIGH (ref 11.5–15.5)
WBC: 7.1 10*3/uL (ref 4.0–10.5)
nRBC: 0 % (ref 0.0–0.2)

## 2022-04-20 LAB — BASIC METABOLIC PANEL
Anion gap: 9 (ref 5–15)
BUN: 8 mg/dL (ref 6–20)
CO2: 23 mmol/L (ref 22–32)
Calcium: 8.9 mg/dL (ref 8.9–10.3)
Chloride: 106 mmol/L (ref 98–111)
Creatinine, Ser: 0.6 mg/dL (ref 0.44–1.00)
GFR, Estimated: >60 mL/min (ref >60)
Glucose, Bld: 105 mg/dL — ABNORMAL HIGH (ref 70–99)
Potassium: 3.3 mmol/L — ABNORMAL LOW (ref 3.5–5.1)
Sodium: 138 mmol/L (ref 135–145)

## 2022-04-20 NOTE — ED Triage Notes (Signed)
Patient here POV from UC.  Endorses Left Sided Dental Pain for several Years for which she typically treats with Antibiotics. Currently on Augmentin.   For the past few hours however she began to have tachycardia at rest, Dizziness.  No CP. No SOB. No Fevers. Sent by UC for Assessment.   NAD Noted during Triage. A&Ox4. GCS 15. Ambulatory.

## 2022-04-20 NOTE — ED Triage Notes (Signed)
The patient states within the last few hours she has been having a rapid resting HR (117bpm), and dizziness/ lightheadedness. The denies chest pain, light sensitivity and SOB. The patient states she has a tooth abscess and noticed another one today.

## 2022-04-20 NOTE — ED Notes (Signed)
Patient is being discharged from the Urgent Care and sent to the Emergency Department via POV . Per L. Morgan-Scales PA-C , patient is in need of higher level of care due to need for further evaluation . Patient is aware and verbalizes understanding of plan of care.  Vitals:   04/20/22 1826  BP: (!) 158/98  Pulse: (!) 115  Resp: 18  Temp: 98.3 F (36.8 C)  SpO2: 99%

## 2022-04-20 NOTE — ED Provider Notes (Signed)
Patient complains of a rapid heart rate, 117, at home while at rest.  Patient states she is also noticed that she has been feeling more dizzy.  Patient states she is currently taking a second of antibiotics for dental abscess and 2 days into taking Augmentin began to feel a second abscess forming in a different location in her mouth.  Patient states she is concerned about getting sepsis.  Patient advised that we are unable to test her for sepsis here but that she is afebrile at this time.  Patient advised that given her history of iron deficiency anemia, rapid heart rate and elevated blood pressure think this is a more likely cause of her tachycardia however I am not able to confirm this without lab tests which we are unable to perform here in an efficient manner.  Patient was agreeable to going to the emergency room now for further evaluation.   Lynden Oxford Scales, PA-C 04/20/22 1858

## 2022-04-21 ENCOUNTER — Emergency Department (HOSPITAL_BASED_OUTPATIENT_CLINIC_OR_DEPARTMENT_OTHER)
Admission: EM | Admit: 2022-04-21 | Discharge: 2022-04-21 | Disposition: A | Discharge status: Home / Self Care | Payer: Medicaid Other | Attending: Emergency Medicine | Admitting: Emergency Medicine

## 2022-04-21 DIAGNOSIS — K0889 Other specified disorders of teeth and supporting structures: Secondary | ICD-10-CM

## 2022-04-21 MED ORDER — HYDROMORPHONE HCL 1 MG/ML IJ SOLN
2.0000 mg | Freq: Once | INTRAMUSCULAR | Status: AC
  Administered 2022-04-21: 2 mg via INTRAMUSCULAR
  Filled 2022-04-21: qty 2

## 2022-04-21 NOTE — ED Provider Notes (Signed)
[facility] EMERGENCY DEPT Provider Note   CSN: [id number] Arrival date & time: 04/20/22  1916     History  Chief Complaint  Patient presents with   Dental Pain    Maria Ho is a 41 y.o. female.  Patient is a 41 year old female with history of PTSD, depression, chronic tooth pain.  Patient presents today with complaints of severe pain to the left upper rear molar.  This has been giving her problems for many years.  She is due to see the dentist in the morning, but the pain became worse this evening.  She describes another area of what she believes is an abscess to the left upper lateral incisor.  The history is provided by the patient.       Home Medications Prior to Admission medications   Medication Sig Start Date End Date Taking? Authorizing Provider  amoxicillin-clavulanate (AUGMENTIN) 875-125 MG tablet Take 1 tablet by mouth 2 (two) times daily. 04/13/22   Jaynee Eagles, PA-C  busPIRone (BUSPAR) 10 MG tablet Take 10 mg by mouth 2 (two) times daily. 10/27/21   [provider]  fluconazole (DIFLUCAN) 150 MG tablet Take 1 tablet today.  Take second tablet 3 days later. 02/19/22   Lynden Oxford Scales, PA-C  hydrOXYzine (VISTARIL) 25 MG capsule Take 50 mg by mouth 2 (two) times daily. 10/27/21   [provider]  terbinafine (LAMISIL) 250 MG tablet Take 250 mg by mouth daily.    [provider]  triamcinolone cream (KENALOG) 0.1 % Apply 1 Application topically 2 (two) times daily. 03/22/22   Jaynee Eagles, PA-C      Allergies    Patient has no known allergies.    Review of Systems   Review of Systems  All other systems reviewed and are negative.   Physical Exam Updated Vital Signs BP (!) 205/125 (BP Location: Right Arm)   Pulse (!) 103   Temp 98.6 F (37 C) (Oral)   Resp 18   Ht '5\' 9"'$  (1.753 m)   Wt 93 kg   LMP 04/16/2022 (Approximate)   SpO2 100%   BMI 30.28 kg/m  Physical Exam Vitals and nursing note reviewed.   Constitutional:      Appearance: Normal appearance.  HENT:     Head: Normocephalic and atraumatic.     Mouth/Throat:     Comments: There is a heavily decayed left upper third molar noted.  There is surrounding gingival inflammation, but no obvious abscess.  There is an area to the left upper gum adjacent to the lateral incisor that is slightly reddened, but not definitively abscessed. Pulmonary:     Effort: Pulmonary effort is normal.  Skin:    General: Skin is warm and dry.  Neurological:     Mental Status: She is alert and oriented to person, place, and time.     ED Results / Procedures / Treatments   Labs (all labs ordered are listed, but only abnormal results are displayed) Labs Reviewed  BASIC METABOLIC PANEL - Abnormal; Notable for the following components:      Result Value   Potassium 3.3 (*)    Glucose, Bld 105 (*)    All other components within normal limits  CBC - Abnormal; Notable for the following components:   Hemoglobin 8.9 (*)    HCT 30.2 (*)    MCV 67.4 (*)    MCH 19.9 (*)    MCHC 29.5 (*)    RDW 18.9 (*)    All other  components within normal limits    EKG None  Radiology No results found.  Procedures Procedures    Medications Ordered in ED Medications  HYDROmorphone (DILAUDID) injection 2 mg (has no administration in time range)    ED Course/ Medical Decision Making/ A&P  Patient presenting with dental pain.  This has been ongoing for several years.  Patient has appointment with a dentist in the morning.  She will be given a shot of pain medication and is to follow-up with her dentist tomorrow as scheduled.  I see no obvious abscess or area that needs intervened on.  Final Clinical Impression(s) / ED Diagnoses Final diagnoses:  None    Rx / DC Orders ED Discharge Orders     None         Veryl Speak, MD 04/21/22 [phone]

## 2022-04-21 NOTE — Discharge Instructions (Signed)
Follow-up with your dentist tomorrow as scheduled.

## 2022-04-27 DIAGNOSIS — F332 Major depressive disorder, recurrent severe without psychotic features: Secondary | ICD-10-CM | POA: Diagnosis not present

## 2022-04-27 DIAGNOSIS — F411 Generalized anxiety disorder: Secondary | ICD-10-CM | POA: Diagnosis not present

## 2022-04-27 DIAGNOSIS — F900 Attention-deficit hyperactivity disorder, predominantly inattentive type: Secondary | ICD-10-CM | POA: Diagnosis not present

## 2022-06-01 DIAGNOSIS — M5386 Other specified dorsopathies, lumbar region: Secondary | ICD-10-CM | POA: Diagnosis not present

## 2022-06-01 DIAGNOSIS — M9905 Segmental and somatic dysfunction of pelvic region: Secondary | ICD-10-CM | POA: Diagnosis not present

## 2022-06-01 DIAGNOSIS — M9903 Segmental and somatic dysfunction of lumbar region: Secondary | ICD-10-CM | POA: Diagnosis not present

## 2022-06-01 DIAGNOSIS — M9904 Segmental and somatic dysfunction of sacral region: Secondary | ICD-10-CM | POA: Diagnosis not present

## 2022-06-02 DIAGNOSIS — M5386 Other specified dorsopathies, lumbar region: Secondary | ICD-10-CM | POA: Diagnosis not present

## 2022-06-02 DIAGNOSIS — M9904 Segmental and somatic dysfunction of sacral region: Secondary | ICD-10-CM | POA: Diagnosis not present

## 2022-06-02 DIAGNOSIS — M9903 Segmental and somatic dysfunction of lumbar region: Secondary | ICD-10-CM | POA: Diagnosis not present

## 2022-06-02 DIAGNOSIS — M9905 Segmental and somatic dysfunction of pelvic region: Secondary | ICD-10-CM | POA: Diagnosis not present

## 2022-06-07 DIAGNOSIS — H547 Unspecified visual loss: Secondary | ICD-10-CM | POA: Diagnosis not present

## 2022-06-07 DIAGNOSIS — D500 Iron deficiency anemia secondary to blood loss (chronic): Secondary | ICD-10-CM | POA: Diagnosis not present

## 2022-06-07 DIAGNOSIS — E6609 Other obesity due to excess calories: Secondary | ICD-10-CM | POA: Diagnosis not present

## 2022-06-07 DIAGNOSIS — N920 Excessive and frequent menstruation with regular cycle: Secondary | ICD-10-CM | POA: Diagnosis not present

## 2022-06-07 DIAGNOSIS — Z6831 Body mass index (BMI) 31.0-31.9, adult: Secondary | ICD-10-CM | POA: Diagnosis not present

## 2022-06-07 DIAGNOSIS — D649 Anemia, unspecified: Secondary | ICD-10-CM | POA: Diagnosis not present

## 2022-06-07 DIAGNOSIS — N921 Excessive and frequent menstruation with irregular cycle: Secondary | ICD-10-CM | POA: Diagnosis not present

## 2022-06-07 DIAGNOSIS — R5383 Other fatigue: Secondary | ICD-10-CM | POA: Diagnosis not present

## 2022-06-07 DIAGNOSIS — E559 Vitamin D deficiency, unspecified: Secondary | ICD-10-CM | POA: Diagnosis not present

## 2022-06-09 DIAGNOSIS — D649 Anemia, unspecified: Secondary | ICD-10-CM | POA: Diagnosis not present

## 2022-06-10 ENCOUNTER — Telehealth: Payer: Self-pay | Admitting: *Deleted

## 2022-06-10 DIAGNOSIS — D649 Anemia, unspecified: Secondary | ICD-10-CM | POA: Diagnosis not present

## 2022-06-10 NOTE — Telephone Encounter (Signed)
Pt left a message stating she had labs done ([facility]) and she needs IV Iron. Last seen by Dr Chryl Heck in July, was a No Show in August for follow up. Please advise.

## 2022-06-13 ENCOUNTER — Telehealth: Payer: Self-pay | Admitting: Hematology and Oncology

## 2022-06-13 NOTE — Telephone Encounter (Signed)
Spoke with patient confirming upcoming appointment 

## 2022-06-22 DIAGNOSIS — F900 Attention-deficit hyperactivity disorder, predominantly inattentive type: Secondary | ICD-10-CM | POA: Diagnosis not present

## 2022-06-22 DIAGNOSIS — F411 Generalized anxiety disorder: Secondary | ICD-10-CM | POA: Diagnosis not present

## 2022-06-22 DIAGNOSIS — F332 Major depressive disorder, recurrent severe without psychotic features: Secondary | ICD-10-CM | POA: Diagnosis not present

## 2022-07-04 ENCOUNTER — Ambulatory Visit (INDEPENDENT_AMBULATORY_CARE_PROVIDER_SITE_OTHER): Payer: Medicaid Other

## 2022-07-04 ENCOUNTER — Inpatient Hospital Stay: Payer: Medicaid Other | Attending: Hematology and Oncology | Admitting: Hematology and Oncology

## 2022-07-04 ENCOUNTER — Other Ambulatory Visit: Payer: Self-pay

## 2022-07-04 ENCOUNTER — Ambulatory Visit (HOSPITAL_COMMUNITY)
Admission: EM | Admit: 2022-07-04 | Discharge: 2022-07-04 | Disposition: A | Discharge status: Home / Self Care | Payer: Medicaid Other | Attending: Internal Medicine | Admitting: Internal Medicine

## 2022-07-04 ENCOUNTER — Encounter (HOSPITAL_COMMUNITY): Payer: Self-pay

## 2022-07-04 VITALS — BP 144/86 | HR 102 | Temp 97.9°F | Resp 16 | Ht 69.0 in | Wt 210.9 lb

## 2022-07-04 DIAGNOSIS — Z79899 Other long term (current) drug therapy: Secondary | ICD-10-CM | POA: Insufficient documentation

## 2022-07-04 DIAGNOSIS — D500 Iron deficiency anemia secondary to blood loss (chronic): Secondary | ICD-10-CM | POA: Diagnosis not present

## 2022-07-04 DIAGNOSIS — R030 Elevated blood-pressure reading, without diagnosis of hypertension: Secondary | ICD-10-CM

## 2022-07-04 DIAGNOSIS — M79672 Pain in left foot: Secondary | ICD-10-CM | POA: Diagnosis not present

## 2022-07-04 DIAGNOSIS — S9782XA Crushing injury of left foot, initial encounter: Secondary | ICD-10-CM | POA: Diagnosis not present

## 2022-07-04 DIAGNOSIS — S99922A Unspecified injury of left foot, initial encounter: Secondary | ICD-10-CM | POA: Diagnosis not present

## 2022-07-04 DIAGNOSIS — M7989 Other specified soft tissue disorders: Secondary | ICD-10-CM | POA: Diagnosis not present

## 2022-07-04 DIAGNOSIS — D649 Anemia, unspecified: Secondary | ICD-10-CM | POA: Diagnosis not present

## 2022-07-04 DIAGNOSIS — F419 Anxiety disorder, unspecified: Secondary | ICD-10-CM | POA: Insufficient documentation

## 2022-07-04 NOTE — ED Triage Notes (Signed)
Patient states a door fell on her left foot. Patient c/o pain, bruising, and swelling to the left foot.  Patient states she took Bayer Back and body x 2 tabs and Meloxicam 1 tab at 0630 today.

## 2022-07-04 NOTE — Progress Notes (Signed)
[facility] CONSULT NOTE  Patient Care Team: Juanda Chance as PCP - General (Physician Assistant)  CHIEF COMPLAINTS/PURPOSE OF CONSULTATION:  IDA  ASSESSMENT & PLAN:   This is a very pleasant 42 year old female patient with no past medical history except for anxiety referred to hematology for further evaluation of severe anemia. We last saw her in July 2023.  She most recently had some labs which showed severe anemia with hemoglobin of 8 and she was very symptomatic and she received 2 units of blood.  She also had iron panel and ferritin with ferritin of 13 and iron panel consistent with severe iron deficiency.  123456 low normal, folic acid levels were normal.  She was hoping to proceed with iron infusion and repeat labs after iron infusion.  She tolerated Venofer very well. She will proceed with Venofer 300 mg x 3 doses.  Return to clinic in 8 weeks with repeat labs.  She does not feel bad enough to warrant a blood transfusion and she refused labs today.   HISTORY OF PRESENTING ILLNESS:  Maria Ho 42 y.o. female is here because of anemia.  This is a very pleasant 42 year old female patient with no significant past medical history referred to hematology for evaluation of anemia.   She is here for follow up of IDA. Since she was last here, she felt much better. She feels more energetic but still very fatigued. She continues to have heavy menstrual cycles , recently had blood transfusion and is here for a follow up. She also has noted SOB on exertion, where she has to stop and take a pause. She also reports PICA. Rest of the pertinent 10 point ROS reviewed and negative  MEDICAL HISTORY:  Past Medical History:  Diagnosis Date   Anemia    Anxiety    PTSD   Arrhythmia    Arthritis    Depression    Fibroid    Headache(784.0)    Migraines   HSV (herpes simplex virus) infection    no outbreak during current pregnancy   Migraine     SURGICAL HISTORY: Past  Surgical History:  Procedure Laterality Date   NO PAST SURGERIES     None      SOCIAL HISTORY: Social History   Socioeconomic History   Marital status: Single    Spouse name: Not on file   Number of children: 3   Years of education: Not on file   Highest education level: Not on file  Occupational History    Employer: [employer]  Tobacco Use   Smoking status: Never   Smokeless tobacco: Never  Vaping Use   Vaping Use: Never used  Substance and Sexual Activity   Alcohol use: Yes    Comment: occasionally   Drug use: Yes    Types: Marijuana    Comment: 2 days ago   Sexual activity: Yes    Birth control/protection: I.U.D.  Other Topics Concern   Not on file  Social History Narrative   Lives with three children.   Occasional caffeine.   Social Determinants of Health   Financial Resource Strain: Not on file  Food Insecurity: Not on file  Transportation Needs: Not on file  Physical Activity: Not on file  Stress: Not on file  Social Connections: Not on file  Intimate Partner Violence: Not on file    FAMILY HISTORY: Family History  Problem Relation Age of Onset   Post-traumatic stress disorder Father    Alcohol abuse Father  Drug abuse Father    Healthy Mother    Diabetes Maternal Grandmother    Cancer Paternal Grandmother        Great-Grandparent   Cancer Paternal Grandfather        Great-Gradnparent    ALLERGIES:  has No Known Allergies.  MEDICATIONS:  Current Outpatient Medications  Medication Sig Dispense Refill   amoxicillin-clavulanate (AUGMENTIN) 875-125 MG tablet Take 1 tablet by mouth 2 (two) times daily. 14 tablet 0   busPIRone (BUSPAR) 10 MG tablet Take 10 mg by mouth 2 (two) times daily.     fluconazole (DIFLUCAN) 150 MG tablet Take 1 tablet today.  Take second tablet 3 days later. 2 tablet 0   hydrOXYzine (VISTARIL) 25 MG capsule Take 50 mg by mouth 2 (two) times daily.     terbinafine (LAMISIL) 250 MG tablet Take 250 mg by mouth daily.      triamcinolone cream (KENALOG) 0.1 % Apply 1 Application topically 2 (two) times daily. 30 g 0   No current facility-administered medications for this visit.     PHYSICAL EXAMINATION: ECOG PERFORMANCE STATUS: 0 - Asymptomatic  Vitals:   07/04/22 0843  BP: (!) 144/86  Pulse: (!) 102  Resp: 16  Temp: 97.9 F (36.6 C)  SpO2: 100%   Filed Weights   07/04/22 0843  Weight: 210 lb 14.4 oz (95.7 kg)    Physical Exam Constitutional:      Appearance: Normal appearance.  Cardiovascular:     Rate and Rhythm: Normal rate and regular rhythm.  Abdominal:     General: Abdomen is flat. Bowel sounds are normal.     Palpations: Abdomen is soft.  Musculoskeletal:        General: No swelling or tenderness. Normal range of motion.     Cervical back: Normal range of motion and neck supple.  Skin:    General: Skin is warm and dry.  Neurological:     General: No focal deficit present.     Mental Status: She is alert.    LABORATORY DATA:  I have reviewed the data as listed Lab Results  Component Value Date   WBC 7.1 04/20/2022   HGB 8.9 (L) 04/20/2022   HCT 30.2 (L) 04/20/2022   MCV 67.4 (L) 04/20/2022   PLT 362 04/20/2022     Chemistry      Component Value Date/Time   NA 138 04/20/2022 1954   K 3.3 (L) 04/20/2022 1954   CL 106 04/20/2022 1954   CO2 23 04/20/2022 1954   BUN 8 04/20/2022 1954   CREATININE 0.60 04/20/2022 1954      Component Value Date/Time   CALCIUM 8.9 04/20/2022 1954   ALKPHOS 64 10/29/2021 0230   AST 18 10/29/2021 0230   ALT 13 10/29/2021 0230   BILITOT 0.4 10/29/2021 0230     CBC from 2/6 showed hemoglobin of 8.2, severe microcytosis. Normal WBC and platelets.  RADIOGRAPHIC STUDIES: I have personally reviewed the radiological images as listed and agreed with the findings in the report. No results found.  All questions were answered. The patient knows to call the clinic with any problems, questions or concerns. I spent 30 minutes in the care of  this patient including H and P, review of records, counseling and coordination of care.     Benay Pike, MD 07/04/2022 8:51 AM

## 2022-07-04 NOTE — ED Provider Notes (Signed)
[place]    CSN: [id number] Arrival date & time: 07/04/22  1113     History   Chief Complaint Chief Complaint  Patient presents with   Foot Injury    HPI Maria Ho is a 42 y.o. female presenting for left foot pain starting this past weekend.  Patient states they are moving in her house and they removed a door to fit the washing machine and dryer into her house.  When the door was removed, it fell on her left foot. Pain worse with walking and weightbearing. Improves with rest. No prior surgeries to the left foot. Denies fever/chills, numbness/tingling. Patient has tried Chief Executive Officer and Meloxicam with no improvement. Presents NAD.  Past Medical History:  Diagnosis Date   Anemia    Anxiety    PTSD   Arrhythmia    Arthritis    Depression    Fibroid    Headache(784.0)    Migraines   HSV (herpes simplex virus) infection    no outbreak during current pregnancy   Migraine     Patient Active Problem List   Diagnosis Date Noted   IDA (iron deficiency anemia) 11/01/2021   PTSD (post-traumatic stress disorder) 01/04/2016   Chronic back pain 01/04/2016   MDD (major depressive disorder), recurrent severe, without psychosis ([place]) 01/03/2016   Chronic post-traumatic stress disorder (PTSD) 01/03/2016   Numbness 10/30/2014   Memory disturbance 10/30/2014   Other fatigue 10/30/2014   Anxiety 05/06/2013   Dermatitis, eczematoid 05/06/2013   Genital herpes 05/06/2013   Cannot sleep 05/06/2013   Oral herpes 05/06/2013   Palpitations 06/15/2011   Bleeding in early pregnancy 04/07/2011    Past Surgical History:  Procedure Laterality Date   NO PAST SURGERIES     None      Home Medications    Prior to Admission medications   Medication Sig Start Date End Date Taking? Authorizing Provider  busPIRone (BUSPAR) 10 MG tablet Take 10 mg by mouth 2 (two) times daily. 10/27/21   [provider]  fluconazole (DIFLUCAN) 150 MG tablet Take 1 tablet today.  Take  second tablet 3 days later. 02/19/22   Lynden Oxford Scales, PA-C  lisdexamfetamine (VYVANSE) 60 MG capsule Take 60 mg by mouth every morning.    [provider]  triamcinolone cream (KENALOG) 0.1 % Apply 1 Application topically 2 (two) times daily. 03/22/22   Jaynee Eagles, PA-C    Family History Family History  Problem Relation Age of Onset   Post-traumatic stress disorder Father    Alcohol abuse Father    Drug abuse Father    Healthy Mother    Diabetes Maternal Grandmother    Cancer Paternal Grandmother        Great-Grandparent   Cancer Paternal Grandfather        Great-Gradnparent    Social History Social History   Tobacco Use   Smoking status: Never   Smokeless tobacco: Never  Vaping Use   Vaping Use: Never used  Substance Use Topics   Alcohol use: Yes    Comment: occasionally   Drug use: Yes    Types: Marijuana    Comment: 2 days ago     Allergies   Patient has no known allergies.   Review of Systems Review of Systems  Constitutional:  Negative for chills and fever.  Cardiovascular:  Negative for chest pain and leg swelling.  Musculoskeletal:  Positive for arthralgias.     Physical Exam Triage Vital Signs ED Triage Vitals [07/04/22  1225]  Enc Vitals Group     BP (!) 169/100     Pulse Rate 87     Resp 16     Temp 98.9 F (37.2 C)     Temp Source Oral     SpO2 98 %     Weight      Height      Head Circumference      Peak Flow      Pain Score 5     Pain Loc      Pain Edu?      Excl. in [place]?    No data found.  Updated Vital Signs BP (!) 156/103 (BP Location: Right Arm)   Pulse 87   Temp 98.9 F (37.2 C) (Oral)   Resp 16   LMP 06/30/2022   SpO2 98%   Physical Exam Constitutional:      General: She is not in acute distress.    Appearance: Normal appearance. She is well-developed and normal weight. She is not ill-appearing or toxic-appearing.  HENT:     Head: Normocephalic and atraumatic.  Cardiovascular:     Rate and  Rhythm: Normal rate and regular rhythm.     Heart sounds: Normal heart sounds.  Pulmonary:     Effort: Pulmonary effort is normal.     Breath sounds: Normal breath sounds.     Comments: Clear to auscultation bilaterally Abdominal:     General: Bowel sounds are normal.     Palpations: Abdomen is soft.     Tenderness: There is no abdominal tenderness.  Musculoskeletal:     Left foot: Decreased range of motion.  Feet:     Left foot:     Skin integrity: No erythema or warmth.     Comments: Decreased ROM in L foot due to pain with walking and weightbearing. Negative Homan's sign. Skin:    General: Skin is warm and dry.     Findings: Abrasion present.     Comments: Skin abrasion noted on dorsal surface of left foot.  No warmth, erythema, discharge.  Neurological:     General: No focal deficit present.     Mental Status: She is alert.  Psychiatric:        Mood and Affect: Mood and affect normal.      UC Treatments / Results  Labs (all labs ordered are listed, but only abnormal results are displayed) Labs Reviewed - No data to display  EKG   Radiology DG Foot Complete Left  Result Date: 07/04/2022 CLINICAL DATA:  Left foot injury EXAM: LEFT FOOT - COMPLETE 3+ VIEW COMPARISON:  None Available. FINDINGS: There is no evidence of acute fracture. Alignment is normal on these nonweightbearing views. There is mild soft tissue swelling of the foot. Plantar and dorsal calcaneal spurring. IMPRESSION: Soft tissue swelling without acute fracture identified radiographically. Electronically Signed   By: Maurine Simmering M.D.   On: 07/04/2022 12:52    Procedures Procedures (including critical care time)  Medications Ordered in UC Medications - No data to display  Initial Impression / Assessment and Plan / UC Course  I have reviewed the triage vital signs and the nursing notes.  Pertinent labs & imaging results that were available during my care of the patient were reviewed by me and considered  in my medical decision making (see chart for details).  Left Foot Pain: Afebrile, nontoxic-appearing, NAD. VSS.  Imaging showed soft tissue swelling without fracture. Recommend following RICE instructions. Post-Op shoe provided due to  difficulty with plantar flexion and walking. OTC analgesics PRN for pain. Return in 2 to 3 days if no improvement. New or worsening symptoms, go directly to the ER.  DDX includes but not limited to: contusion, sprain, ect.  Elevated BP: Asymptomatic. Reports noticing that her BP has been increasing and believes it maybe secondary to Vyvanse. Discuss starting medication at today's visit, but patient refused stating she wanted to follow up with PCP instead. Recommend she start monitoring BP daily and follow up with [place] sooner if unable to get in with her PCP. If CP, SOB, headache, numbness/tingling develops, recommend she go directly to the ER. Strict ED precautions were given and patient verbalized understanding.   Final Clinical Impressions(s) / UC Diagnoses   Final diagnoses:  Left foot pain  Crush injury to foot, left, initial encounter  Elevated blood pressure reading in office without diagnosis of hypertension     Discharge Instructions      The x-rays of your foot were negative for a break. Recommend rest, ice, compression, and elevation. Tylenol/NSAIDs as needed for pain. Avoid doubling up on NSAIDs such as taking Motrin and Aleve. You were given a post-op shoe to wear for support. Wear daily for the next week. Return in 2 to 3 days if no improvement. New or worsening symptoms, go directly to the ER.   Your BP was elevated at this visit. Recommend you follow up with your PCP as soon as possible. Monitor BP daily preferably at the same time each day. If readings persistently >140/90, follow up with our office or your PCP sooner. If chest pain, shortness of breath, headache, numbness/tingling, ect. go directly to the ER.   ED Prescriptions   None     PDMP not reviewed this encounter.   Lucee Brissett P, PA-C 07/04/22 1344

## 2022-07-04 NOTE — Discharge Instructions (Addendum)
The x-rays of your foot were negative for a break. Recommend rest, ice, compression, and elevation. Tylenol/NSAIDs as needed for pain. Avoid doubling up on NSAIDs such as taking Motrin and Aleve. You were given a post-op shoe to wear for support. Wear daily for the next week. Return in 2 to 3 days if no improvement. New or worsening symptoms, go directly to the ER.   Your BP was elevated at this visit. Recommend you follow up with your PCP as soon as possible. Monitor BP daily preferably at the same time each day. If readings persistently >140/90, follow up with our office or your PCP sooner. If chest pain, shortness of breath, headache, numbness/tingling, ect. go directly to the ER.

## 2022-07-11 ENCOUNTER — Inpatient Hospital Stay: Payer: Medicaid Other

## 2022-07-11 ENCOUNTER — Other Ambulatory Visit: Payer: Self-pay

## 2022-07-11 ENCOUNTER — Telehealth: Payer: Self-pay | Admitting: Hematology and Oncology

## 2022-07-11 VITALS — BP 144/90 | HR 91 | Temp 98.2°F | Resp 18

## 2022-07-11 DIAGNOSIS — F419 Anxiety disorder, unspecified: Secondary | ICD-10-CM | POA: Diagnosis not present

## 2022-07-11 DIAGNOSIS — Z79899 Other long term (current) drug therapy: Secondary | ICD-10-CM | POA: Diagnosis not present

## 2022-07-11 DIAGNOSIS — D500 Iron deficiency anemia secondary to blood loss (chronic): Secondary | ICD-10-CM

## 2022-07-11 DIAGNOSIS — D649 Anemia, unspecified: Secondary | ICD-10-CM | POA: Diagnosis not present

## 2022-07-11 MED ORDER — SODIUM CHLORIDE 0.9 % IV SOLN: Freq: Once | INTRAVENOUS | Status: AC

## 2022-07-11 MED ORDER — SODIUM CHLORIDE 0.9 % IV SOLN
300.0000 mg | Freq: Once | INTRAVENOUS | Status: AC
  Administered 2022-07-11: 300 mg via INTRAVENOUS
  Filled 2022-07-11: qty 300

## 2022-07-11 NOTE — Telephone Encounter (Signed)
Rescheduled appointment per patients request. She is aware of the changes made to her upcoming appointment.

## 2022-07-11 NOTE — Patient Instructions (Signed)

## 2022-07-11 NOTE — Progress Notes (Signed)
Pt refused to stay for 30 minute post evaluation. Explained to pt reasons for staying and pt states understanding. VSS and pt discharged in stable condition.

## 2022-07-18 ENCOUNTER — Inpatient Hospital Stay: Payer: Medicaid Other

## 2022-07-18 ENCOUNTER — Other Ambulatory Visit: Payer: Self-pay

## 2022-07-18 VITALS — BP 135/98 | HR 87 | Temp 98.0°F | Resp 18

## 2022-07-18 DIAGNOSIS — F419 Anxiety disorder, unspecified: Secondary | ICD-10-CM | POA: Diagnosis not present

## 2022-07-18 DIAGNOSIS — D500 Iron deficiency anemia secondary to blood loss (chronic): Secondary | ICD-10-CM

## 2022-07-18 DIAGNOSIS — Z79899 Other long term (current) drug therapy: Secondary | ICD-10-CM | POA: Diagnosis not present

## 2022-07-18 DIAGNOSIS — D649 Anemia, unspecified: Secondary | ICD-10-CM | POA: Diagnosis not present

## 2022-07-18 MED ORDER — SODIUM CHLORIDE 0.9 % IV SOLN
300.0000 mg | Freq: Once | INTRAVENOUS | Status: AC
  Administered 2022-07-18: 300 mg via INTRAVENOUS
  Filled 2022-07-18: qty 300

## 2022-07-18 MED ORDER — SODIUM CHLORIDE 0.9 % IV SOLN: Freq: Once | INTRAVENOUS | Status: AC

## 2022-07-25 ENCOUNTER — Inpatient Hospital Stay: Payer: Medicaid Other

## 2022-07-26 ENCOUNTER — Inpatient Hospital Stay: Payer: Medicaid Other

## 2022-08-30 DIAGNOSIS — D500 Iron deficiency anemia secondary to blood loss (chronic): Secondary | ICD-10-CM | POA: Diagnosis not present

## 2022-08-30 DIAGNOSIS — N921 Excessive and frequent menstruation with irregular cycle: Secondary | ICD-10-CM | POA: Diagnosis not present

## 2022-08-30 DIAGNOSIS — R42 Dizziness and giddiness: Secondary | ICD-10-CM | POA: Diagnosis not present

## 2022-08-30 DIAGNOSIS — G90A Postural orthostatic tachycardia syndrome (POTS): Secondary | ICD-10-CM | POA: Diagnosis not present

## 2022-08-30 DIAGNOSIS — E559 Vitamin D deficiency, unspecified: Secondary | ICD-10-CM | POA: Diagnosis not present

## 2022-09-14 DIAGNOSIS — F411 Generalized anxiety disorder: Secondary | ICD-10-CM | POA: Diagnosis not present

## 2022-09-14 DIAGNOSIS — F332 Major depressive disorder, recurrent severe without psychotic features: Secondary | ICD-10-CM | POA: Diagnosis not present

## 2022-09-14 DIAGNOSIS — F900 Attention-deficit hyperactivity disorder, predominantly inattentive type: Secondary | ICD-10-CM | POA: Diagnosis not present

## 2022-09-19 ENCOUNTER — Inpatient Hospital Stay: Payer: Medicaid Other

## 2022-09-19 ENCOUNTER — Inpatient Hospital Stay: Payer: Medicaid Other | Admitting: Hematology and Oncology

## 2022-09-20 ENCOUNTER — Telehealth: Payer: Self-pay | Admitting: Hematology and Oncology

## 2022-09-20 NOTE — Telephone Encounter (Signed)
Spoke with patient confirming upcoming appointment  

## 2022-10-03 ENCOUNTER — Telehealth: Payer: Self-pay | Admitting: Hematology and Oncology

## 2022-10-03 ENCOUNTER — Inpatient Hospital Stay: Payer: Medicaid Other | Attending: Hematology and Oncology

## 2022-10-03 NOTE — Telephone Encounter (Signed)
I called patient and stated that she had a 7:30am infusion today. She stated that she was called into work and would not be able  to come,I no showed her.

## 2022-10-05 DIAGNOSIS — F411 Generalized anxiety disorder: Secondary | ICD-10-CM | POA: Diagnosis not present

## 2022-10-05 DIAGNOSIS — F332 Major depressive disorder, recurrent severe without psychotic features: Secondary | ICD-10-CM | POA: Diagnosis not present

## 2022-10-05 DIAGNOSIS — F900 Attention-deficit hyperactivity disorder, predominantly inattentive type: Secondary | ICD-10-CM | POA: Diagnosis not present

## 2022-10-10 ENCOUNTER — Inpatient Hospital Stay: Payer: Medicaid Other | Admitting: Hematology and Oncology

## 2022-10-10 ENCOUNTER — Inpatient Hospital Stay: Payer: Medicaid Other

## 2023-06-03 ENCOUNTER — Ambulatory Visit (HOSPITAL_COMMUNITY)
Admission: EM | Admit: 2023-06-03 | Discharge: 2023-06-03 | Discharge status: Absent without leave | Payer: Medicaid Other

## 2023-06-03 ENCOUNTER — Encounter: Payer: Self-pay | Admitting: Hematology and Oncology

## 2023-06-03 ENCOUNTER — Other Ambulatory Visit: Payer: Self-pay

## 2023-06-03 ENCOUNTER — Encounter (HOSPITAL_BASED_OUTPATIENT_CLINIC_OR_DEPARTMENT_OTHER): Payer: Self-pay

## 2023-06-03 ENCOUNTER — Emergency Department (HOSPITAL_BASED_OUTPATIENT_CLINIC_OR_DEPARTMENT_OTHER)
Admission: EM | Admit: 2023-06-03 | Discharge: 2023-06-03 | Disposition: A | Discharge status: Home / Self Care | Payer: Managed Care, Other (non HMO) | Attending: Emergency Medicine | Admitting: Emergency Medicine

## 2023-06-03 DIAGNOSIS — R42 Dizziness and giddiness: Secondary | ICD-10-CM

## 2023-06-03 DIAGNOSIS — D500 Iron deficiency anemia secondary to blood loss (chronic): Secondary | ICD-10-CM | POA: Insufficient documentation

## 2023-06-03 LAB — CBC WITH DIFFERENTIAL/PLATELET
Abs Immature Granulocytes: 0.02 10*3/uL (ref 0.00–0.07)
Basophils Absolute: 0.1 10*3/uL (ref 0.0–0.1)
Basophils Relative: 1 %
Eosinophils Absolute: 0.3 10*3/uL (ref 0.0–0.5)
Eosinophils Relative: 4 %
HCT: 24.8 % — ABNORMAL LOW (ref 36.0–46.0)
Hemoglobin: 6.9 g/dL — CL (ref 12.0–15.0)
Immature Granulocytes: 0 %
Lymphocytes Relative: 20 %
Lymphs Abs: 1.6 10*3/uL (ref 0.7–4.0)
MCH: 17.4 pg — ABNORMAL LOW (ref 26.0–34.0)
MCHC: 27.8 g/dL — ABNORMAL LOW (ref 30.0–36.0)
MCV: 62.6 fL — ABNORMAL LOW (ref 80.0–100.0)
Monocytes Absolute: 0.9 10*3/uL (ref 0.1–1.0)
Monocytes Relative: 12 %
Neutro Abs: 4.8 10*3/uL (ref 1.7–7.7)
Neutrophils Relative %: 63 %
Platelets: 232 10*3/uL (ref 150–400)
RBC: 3.96 MIL/uL (ref 3.87–5.11)
RDW: 20.2 % — ABNORMAL HIGH (ref 11.5–15.5)
WBC: 7.6 10*3/uL (ref 4.0–10.5)
nRBC: 0 % (ref 0.0–0.2)

## 2023-06-03 LAB — URINALYSIS, ROUTINE W REFLEX MICROSCOPIC
Bilirubin Urine: NEGATIVE
Glucose, UA: NEGATIVE mg/dL
Hgb urine dipstick: NEGATIVE
Ketones, ur: NEGATIVE mg/dL
Leukocytes,Ua: NEGATIVE
Nitrite: NEGATIVE
Protein, ur: NEGATIVE mg/dL
Specific Gravity, Urine: 1.034 — ABNORMAL HIGH (ref 1.005–1.030)
pH: 6.5 (ref 5.0–8.0)

## 2023-06-03 LAB — BASIC METABOLIC PANEL
Anion gap: 7 (ref 5–15)
BUN: 11 mg/dL (ref 6–20)
CO2: 21 mmol/L — ABNORMAL LOW (ref 22–32)
Calcium: 8.4 mg/dL — ABNORMAL LOW (ref 8.9–10.3)
Chloride: 109 mmol/L (ref 98–111)
Creatinine, Ser: 0.6 mg/dL (ref 0.44–1.00)
GFR, Estimated: >60 mL/min (ref >60)
Glucose, Bld: 91 mg/dL (ref 70–99)
Potassium: 3.9 mmol/L (ref 3.5–5.1)
Sodium: 137 mmol/L (ref 135–145)

## 2023-06-03 LAB — HCG, SERUM, QUALITATIVE: Preg, Serum: NEGATIVE

## 2023-06-03 LAB — PREPARE RBC (CROSSMATCH)

## 2023-06-03 MED ORDER — FERROUS SULFATE 325 (65 FE) MG PO TABS: 325.0000 mg | ORAL_TABLET | Freq: Three times a day (TID) | ORAL | 0 refills | Status: DC

## 2023-06-03 MED ORDER — SODIUM CHLORIDE 0.9% IV SOLUTION: Freq: Once | INTRAVENOUS | Status: AC

## 2023-06-03 NOTE — ED Triage Notes (Signed)
Patient arrives POV with complaints increased weakness and dizziness x1 day. Patient states that she is anemic and concerned that her leverl may be low.

## 2023-06-03 NOTE — Discharge Instructions (Addendum)
We evaluated you for your weakness and lightheadedness.  Your symptoms are likely due to anemia.  Your hemoglobin is low.  We gave you a blood transfusion in the emergency department.  I prescribed you iron supplementation.  Please follow-up closely with your primary doctor to get a recheck of your blood count next week.  If you have any recurrent or worsening symptoms please return to the emergency department.

## 2023-06-03 NOTE — ED Provider Notes (Signed)
    Procedures  .Critical Care  Performed by: Lonell Grandchild, MD Authorized by: Lonell Grandchild, MD   Critical care provider statement:    Critical care time (minutes):  30   Critical care was necessary to treat or prevent imminent or life-threatening deterioration of the following conditions:  Circulatory failure   Critical care was time spent personally by me on the following activities:  Development of treatment plan with patient or surrogate, discussions with consultants, evaluation of patient's response to treatment, examination of patient, ordering and review of laboratory studies, ordering and review of radiographic studies, ordering and performing treatments and interventions, pulse oximetry, re-evaluation of patient's condition and review of old charts   ED Course / MDM   Clinical Course as of 06/03/23 2209  Sat Jun 03, 2023  2208 Patient sent from [facility] for blood transfusion.  She corroborates the story of heavy menstrual cycles.  She has been feeling more lightheaded.  The patient consented for blood and received blood transfusion.  She reports that she does feel little bit better.  She has a primary care doctor and I recommended that she follow-up with her primary doctor.  Will prescribe iron supplementation. Will discharge patient to home. All questions answered. Patient comfortable with plan of discharge. Return precautions discussed with patient and specified on the after visit summary. [WS]    Clinical Course User Index [WS] Lonell Grandchild, MD   Medical Decision Making Amount and/or Complexity of Data Reviewed Labs: ordered.  Risk OTC drugs. Prescription drug management.          Lonell Grandchild, MD 06/03/23 2209

## 2023-06-03 NOTE — ED Provider Notes (Signed)
[place] EMERGENCY DEPARTMENT AT [facility] Provider Note   CSN: [id number] Arrival date & time: 06/03/23  1125     History  Chief Complaint  Patient presents with   Fatigue   Dizziness    Maria Ho is a 43 y.o. female.  Patient with history of iron deficiency anemia presents to the emergency department today for evaluation of fatigue, lightheadedness, shortness of breath with activity.  Patient states that she has been "craving ice" for several months.  She is not currently on any type of iron supplementation.  She has a history of two IV iron infusions and a blood transfusion.  She reports heavy menstrual periods.  Last period ended on 05/15/2023.  She started feeling worse this morning, prompting emergency department visit.  She states that she recently reestablished health insurance.  She is concerned about her counts being low.  No associated chest pain.  No lower extremity swelling or pain.  No history of blood clots.       Home Medications Prior to Admission medications   Medication Sig Start Date End Date Taking? Authorizing Provider  busPIRone (BUSPAR) 10 MG tablet Take 10 mg by mouth 2 (two) times daily. 10/27/21   [provider]  fluconazole (DIFLUCAN) 150 MG tablet Take 1 tablet today.  Take second tablet 3 days later. 02/19/22   Theadora Rama Scales, PA-C  lisdexamfetamine (VYVANSE) 60 MG capsule Take 60 mg by mouth every morning.    [provider]  triamcinolone cream (KENALOG) 0.1 % Apply 1 Application topically 2 (two) times daily. 03/22/22   Wallis Bamberg, PA-C      Allergies    Patient has no known allergies.    Review of Systems   Review of Systems  Physical Exam Updated Vital Signs BP (!) 143/83 (BP Location: Right Arm)   Pulse 98   Temp 97.9 F (36.6 C)   Resp 20   Ht 5\' 9"  (1.753 m)   Wt 106.6 kg   SpO2 100%   BMI 34.70 kg/m   Physical Exam Vitals and nursing note reviewed.  Constitutional:      General:  She is not in acute distress.    Appearance: She is well-developed.  HENT:     Head: Normocephalic and atraumatic.     Right Ear: External ear normal.     Left Ear: External ear normal.     Nose: Nose normal.  Eyes:     Comments: Pale conjunctiva  Cardiovascular:     Rate and Rhythm: Regular rhythm. Tachycardia present.     Heart sounds: No murmur heard.    Comments: Mild tachycardia 100-105 Pulmonary:     Effort: No respiratory distress.     Breath sounds: No wheezing, rhonchi or rales.  Abdominal:     Palpations: Abdomen is soft.     Tenderness: There is no abdominal tenderness. There is no guarding or rebound.  Musculoskeletal:     Cervical back: Normal range of motion and neck supple.     Right lower leg: No edema.     Left lower leg: No edema.  Skin:    General: Skin is warm and dry.     Coloration: Skin is pale.     Findings: No rash.  Neurological:     General: No focal deficit present.     Mental Status: She is alert. Mental status is at baseline.     Motor: No weakness.  Psychiatric:  Mood and Affect: Mood normal.     ED Results / Procedures / Treatments   Labs (all labs ordered are listed, but only abnormal results are displayed) Labs Reviewed  CBC WITH DIFFERENTIAL/PLATELET - Abnormal; Notable for the following components:      Result Value   Hemoglobin 6.9 (*)    HCT 24.8 (*)    MCV 62.6 (*)    MCH 17.4 (*)    MCHC 27.8 (*)    RDW 20.2 (*)    All other components within normal limits  BASIC METABOLIC PANEL - Abnormal; Notable for the following components:   CO2 21 (*)    Calcium 8.4 (*)    All other components within normal limits  URINALYSIS, ROUTINE W REFLEX MICROSCOPIC - Abnormal; Notable for the following components:   Specific Gravity, Urine 1.034 (*)    All other components within normal limits  HCG, SERUM, QUALITATIVE    EKG None  Radiology No results found.  Procedures Procedures    Medications Ordered in ED Medications -  No data to display  ED Course/ Medical Decision Making/ A&P    Patient seen and examined. History obtained directly from patient.   Labs/EKG: Ordered CBC, BMP, pregnancy, UA.  Imaging: None ordered  Medications/Fluids: None ordered  Most recent vital signs reviewed and are as follows: BP (!) 143/83 (BP Location: Right Arm)   Pulse 98   Temp 97.9 F (36.6 C)   Resp 20   Ht 5\' 9"  (1.753 m)   Wt 106.6 kg   SpO2 100%   BMI 34.70 kg/m   Initial impression: Lightheadedness, evaluate degree of anemia  1:16 PM Reassessment performed. Patient appears stable, comfortable.  Labs personally reviewed and interpreted including: CBC with critically low hemoglobin at 6.9, microcytic, normal white blood cell count and platelets; BMP unremarkable; pregnancy negative; UA concentrated but no signs of infection.  Most current vital signs reviewed and are as follows: BP (!) 139/96   Pulse 93   Temp 97.9 F (36.6 C)   Resp 15   Ht 5\' 9"  (1.753 m)   Wt 106.6 kg   SpO2 100%   BMI 34.70 kg/m   Plan: Patient would likely benefit from blood transfusion and she wants to have a transfusion.  Discussed with patient that she will need to be transferred to [facility] in order to get this done.  Dr. Renaye Rakers in the ED accepting.   We discussed transport by private vehicle and ambulance.  We discussed risks and benefits.  Patient would like to go by POV.  She will have her mother drive her over to [facility]  Will secure IV, patient is comfortable transporting with IV in place.  She is aware that if she needs to wait in the emergency department and decides to leave, that she needs to have the IV removed.  She is in agreement.                                Medical Decision Making Amount and/or Complexity of Data Reviewed Labs: ordered.   Patient with history of iron deficiency anemia with hemoglobin of 6.9 today.  She is symptomatic with lightheadedness and fatigue.  No full syncope.  Patient  looks comfortable and vital signs are stable.  Plan for transfer for blood transfusion.  She will likely be able to go home after a unit of blood.  She is establishing care with  PCP and will continue to follow-up.  She may need IV iron in the future, given her past history.        Final Clinical Impression(s) / ED Diagnoses Final diagnoses:  Iron deficiency anemia due to chronic blood loss  Lightheadedness    Rx / DC Orders ED Discharge Orders     None         Renne Crigler, PA-C 06/03/23 1320    Horton, Clabe Seal, DO 06/04/23 [phone]

## 2023-06-05 LAB — TYPE AND SCREEN
ABO/RH(D): O POS
Antibody Screen: NEGATIVE
Unit division: 0

## 2023-06-05 LAB — BPAM RBC
Blood Product Expiration Date: 202503082359
ISSUE DATE / TIME: 202502011749
Unit Type and Rh: 5100

## 2023-07-11 ENCOUNTER — Emergency Department (HOSPITAL_BASED_OUTPATIENT_CLINIC_OR_DEPARTMENT_OTHER)
Admission: EM | Admit: 2023-07-11 | Discharge: 2023-07-11 | Disposition: A | Discharge status: Home / Self Care | Attending: Emergency Medicine | Admitting: Emergency Medicine

## 2023-07-11 ENCOUNTER — Other Ambulatory Visit (HOSPITAL_BASED_OUTPATIENT_CLINIC_OR_DEPARTMENT_OTHER): Payer: Self-pay

## 2023-07-11 ENCOUNTER — Encounter: Payer: Self-pay | Admitting: Hematology and Oncology

## 2023-07-11 ENCOUNTER — Emergency Department (HOSPITAL_BASED_OUTPATIENT_CLINIC_OR_DEPARTMENT_OTHER)

## 2023-07-11 ENCOUNTER — Encounter (HOSPITAL_BASED_OUTPATIENT_CLINIC_OR_DEPARTMENT_OTHER): Payer: Self-pay

## 2023-07-11 ENCOUNTER — Other Ambulatory Visit: Payer: Self-pay

## 2023-07-11 DIAGNOSIS — R1032 Left lower quadrant pain: Secondary | ICD-10-CM | POA: Insufficient documentation

## 2023-07-11 DIAGNOSIS — R1013 Epigastric pain: Secondary | ICD-10-CM | POA: Diagnosis not present

## 2023-07-11 DIAGNOSIS — F1290 Cannabis use, unspecified, uncomplicated: Secondary | ICD-10-CM | POA: Diagnosis not present

## 2023-07-11 DIAGNOSIS — R109 Unspecified abdominal pain: Secondary | ICD-10-CM

## 2023-07-11 DIAGNOSIS — R1012 Left upper quadrant pain: Secondary | ICD-10-CM | POA: Diagnosis not present

## 2023-07-11 LAB — COMPREHENSIVE METABOLIC PANEL
ALT: 14 U/L (ref 0–44)
AST: 13 U/L — ABNORMAL LOW (ref 15–41)
Albumin: 4.5 g/dL (ref 3.5–5.0)
Alkaline Phosphatase: 55 U/L (ref 38–126)
Anion gap: 13 (ref 5–15)
BUN: 12 mg/dL (ref 6–20)
CO2: 16 mmol/L — ABNORMAL LOW (ref 22–32)
Calcium: 8.7 mg/dL — ABNORMAL LOW (ref 8.9–10.3)
Chloride: 108 mmol/L (ref 98–111)
Creatinine, Ser: 0.55 mg/dL (ref 0.44–1.00)
GFR, Estimated: >60 mL/min (ref >60)
Glucose, Bld: 92 mg/dL (ref 70–99)
Potassium: 3.4 mmol/L — ABNORMAL LOW (ref 3.5–5.1)
Sodium: 137 mmol/L (ref 135–145)
Total Bilirubin: 0.3 mg/dL (ref 0.0–1.2)
Total Protein: 7.6 g/dL (ref 6.5–8.1)

## 2023-07-11 LAB — URINALYSIS, ROUTINE W REFLEX MICROSCOPIC
Bacteria, UA: NONE SEEN
Bilirubin Urine: NEGATIVE
Glucose, UA: NEGATIVE mg/dL
Ketones, ur: >80 mg/dL — AB
Leukocytes,Ua: NEGATIVE
Nitrite: NEGATIVE
Protein, ur: 30 mg/dL — AB
RBC / HPF: >50 RBC/hpf (ref 0–5)
Specific Gravity, Urine: 1.028 (ref 1.005–1.030)
pH: 5.5 (ref 5.0–8.0)

## 2023-07-11 LAB — CBC
HCT: 37.4 % (ref 36.0–46.0)
Hemoglobin: 11.1 g/dL — ABNORMAL LOW (ref 12.0–15.0)
MCH: 20 pg — ABNORMAL LOW (ref 26.0–34.0)
MCHC: 29.7 g/dL — ABNORMAL LOW (ref 30.0–36.0)
MCV: 67.4 fL — ABNORMAL LOW (ref 80.0–100.0)
Platelets: 213 10*3/uL (ref 150–400)
RBC: 5.55 MIL/uL — ABNORMAL HIGH (ref 3.87–5.11)
RDW: 29.3 % — ABNORMAL HIGH (ref 11.5–15.5)
WBC: 8.9 10*3/uL (ref 4.0–10.5)
nRBC: 0 % (ref 0.0–0.2)

## 2023-07-11 LAB — LIPASE, BLOOD: Lipase: 17 U/L (ref 11–51)

## 2023-07-11 LAB — RAPID URINE DRUG SCREEN, HOSP PERFORMED
Amphetamines: NOT DETECTED
Barbiturates: NOT DETECTED
Benzodiazepines: NOT DETECTED
Cocaine: NOT DETECTED
Opiates: NOT DETECTED
Tetrahydrocannabinol: POSITIVE — AB

## 2023-07-11 LAB — PREGNANCY, URINE: Preg Test, Ur: NEGATIVE

## 2023-07-11 MED ORDER — KETOROLAC TROMETHAMINE 15 MG/ML IJ SOLN
15.0000 mg | Freq: Once | INTRAMUSCULAR | Status: AC
  Administered 2023-07-11: 15 mg via INTRAVENOUS
  Filled 2023-07-11: qty 1

## 2023-07-11 MED ORDER — PANTOPRAZOLE SODIUM 20 MG PO TBEC
20.0000 mg | DELAYED_RELEASE_TABLET | Freq: Every day | ORAL | 0 refills | Status: DC
  Filled 2023-07-11: qty 14, 14d supply, fill #0

## 2023-07-11 MED ORDER — SODIUM CHLORIDE 0.9 % IV BOLUS
1000.0000 mL | Freq: Once | INTRAVENOUS | Status: AC
  Administered 2023-07-11: 1000 mL via INTRAVENOUS

## 2023-07-11 MED ORDER — ONDANSETRON HCL 4 MG PO TABS
4.0000 mg | ORAL_TABLET | ORAL | 0 refills | Status: DC | PRN
  Filled 2023-07-11: qty 12, 2d supply, fill #0

## 2023-07-11 MED ORDER — HYDROCODONE-ACETAMINOPHEN 5-325 MG PO TABS: 1.0000 | ORAL_TABLET | Freq: Once | ORAL | Status: DC

## 2023-07-11 MED ORDER — FENTANYL CITRATE PF 50 MCG/ML IJ SOSY
50.0000 ug | PREFILLED_SYRINGE | Freq: Once | INTRAMUSCULAR | Status: AC
  Administered 2023-07-11: 50 ug via INTRAVENOUS
  Filled 2023-07-11: qty 1

## 2023-07-11 MED ORDER — PANTOPRAZOLE SODIUM 40 MG IV SOLR
40.0000 mg | Freq: Once | INTRAVENOUS | Status: AC
  Administered 2023-07-11: 40 mg via INTRAVENOUS
  Filled 2023-07-11: qty 10

## 2023-07-11 MED ORDER — ONDANSETRON HCL 4 MG/2ML IJ SOLN
4.0000 mg | Freq: Three times a day (TID) | INTRAMUSCULAR | Status: DC | PRN
  Filled 2023-07-11: qty 2

## 2023-07-11 MED ORDER — IOHEXOL 300 MG/ML  SOLN
100.0000 mL | Freq: Once | INTRAMUSCULAR | Status: AC | PRN
  Administered 2023-07-11: 100 mL via INTRAVENOUS

## 2023-07-11 MED ORDER — SUCRALFATE 1 G PO TABS
1.0000 g | ORAL_TABLET | Freq: Three times a day (TID) | ORAL | 0 refills | Status: DC
  Filled 2023-07-11: qty 21, 7d supply, fill #0

## 2023-07-11 NOTE — ED Provider Notes (Signed)
  Provider Note MRN:  [id number]  Arrival date & time: 07/11/23    ED Course and Medical Decision Making  Assumed care from Ernie Avena at shift change.  See note from prior team for complete details, in brief:   Clinical Course as of 07/11/23 1203  Tue Jul 11, 2023  0705 Handoff from JL 43 yo female here w/ LLQ abd pain x24 hrs Labs stable Pending CT  [SG]  1114 Tetrahydrocannabinol(!): POSITIVE [SG]  1154 CT stable [SG]    Clinical Course User Index [SG] Tanda Rockers A, DO   Tolerating po, feeling better  The patient improved significantly and was discharged in stable condition. Detailed discussions were had with the patient/guardian regarding current findings, and need for close f/u with PCP or on call doctor. The patient/guardian has been instructed to return immediately if the symptoms worsen in any way for re-evaluation. Patient/guardian verbalized understanding and is in agreement with current care plan. All questions answered prior to discharge.   Procedures  Final Clinical Impressions(s) / ED Diagnoses     ICD-10-CM   1. Abdominal pain, unspecified abdominal location  R10.9     2. Cannabis use disorder  F12.90       ED Discharge Orders          Ordered    pantoprazole (PROTONIX) 20 MG tablet  Daily        07/11/23 1117    sucralfate (CARAFATE) 1 g tablet  3 times daily with meals        07/11/23 1117    ondansetron (ZOFRAN) 4 MG tablet  Every 4 hours PRN        07/11/23 1117              Discharge Instructions      It was a pleasure caring for you today in the emergency department.  Recommend eat a very bland diet, avoid alcohol, tobacco, avoid ALL CANNABIS/MARIJUANA  Please return to the emergency department for any worsening or worrisome symptoms.        Sloan Leiter, DO 07/11/23 1203

## 2023-07-11 NOTE — Discharge Instructions (Addendum)
 It was a pleasure caring for you today in the emergency department.  Recommend eat a very bland diet, avoid alcohol, tobacco, avoid ALL CANNABIS/MARIJUANA  Please return to the emergency department for any worsening or worrisome symptoms.

## 2023-07-11 NOTE — ED Provider Notes (Signed)
 [place] EMERGENCY DEPARTMENT AT [facility] Provider Note   CSN: [id number] Arrival date & time: 07/11/23  0559     History  Chief Complaint  Patient presents with   Abdominal Pain    Maria Ho is a 43 y.o. female.   Abdominal Pain Associated symptoms: nausea      43 year old female presenting to the emergency department with chief complaint of abdominal pain.  The patient states that she developed left lower quadrant abdominal pain which is now in her left upper quadrant.  She now endorses also epigastric pain.  No radiation to the back.  She endorses nausea, denies any vomiting.  She states that her last bowel movement was within the past day and she was relatively loose.  She had tried some supplements over-the-counter because she thought she may have been "backed up."  No fevers or chills.  Home Medications Prior to Admission medications   Medication Sig Start Date End Date Taking? Authorizing Provider  busPIRone (BUSPAR) 10 MG tablet Take 10 mg by mouth 2 (two) times daily. 10/27/21   [provider]  ferrous sulfate 325 (65 FE) MG tablet Take 1 tablet (325 mg total) by mouth 3 (three) times daily with meals. 06/03/23   Lonell Grandchild, MD  fluconazole (DIFLUCAN) 150 MG tablet Take 1 tablet today.  Take second tablet 3 days later. 02/19/22   Theadora Rama Scales, PA-C  lisdexamfetamine (VYVANSE) 60 MG capsule Take 60 mg by mouth every morning.    [provider]  triamcinolone cream (KENALOG) 0.1 % Apply 1 Application topically 2 (two) times daily. 03/22/22   Wallis Bamberg, PA-C      Allergies    Patient has no known allergies.    Review of Systems   Review of Systems  Gastrointestinal:  Positive for abdominal pain and nausea.  All other systems reviewed and are negative.   Physical Exam Updated Vital Signs BP (!) 132/90   Pulse (!) 109   Temp 98.3 F (36.8 C) (Oral)   Resp 18   LMP 07/11/2023 (Exact Date)   SpO2 98%   Physical Exam Vitals and nursing note reviewed.  Constitutional:      General: She is not in acute distress.    Appearance: She is well-developed.  HENT:     Head: Normocephalic and atraumatic.  Eyes:     Conjunctiva/sclera: Conjunctivae normal.  Cardiovascular:     Rate and Rhythm: Normal rate and regular rhythm.     Heart sounds: No murmur heard. Pulmonary:     Effort: Pulmonary effort is normal. No respiratory distress.     Breath sounds: Normal breath sounds.  Abdominal:     Palpations: Abdomen is soft.     Tenderness: There is abdominal tenderness in the epigastric area and left upper quadrant. There is no guarding or rebound.  Musculoskeletal:        General: No swelling.     Cervical back: Neck supple.  Skin:    General: Skin is warm and dry.     Capillary Refill: Capillary refill takes less than 2 seconds.  Neurological:     Mental Status: She is alert.  Psychiatric:        Mood and Affect: Mood normal.     ED Results / Procedures / Treatments   Labs (all labs ordered are listed, but only abnormal results are displayed) Labs Reviewed  LIPASE, BLOOD  COMPREHENSIVE METABOLIC PANEL  CBC  URINALYSIS, ROUTINE W REFLEX MICROSCOPIC  PREGNANCY,  URINE    EKG None  Radiology No results found.  Procedures Procedures    Medications Ordered in ED Medications  sodium chloride 0.9 % bolus 1,000 mL (has no administration in time range)  fentaNYL (SUBLIMAZE) injection 50 mcg (has no administration in time range)  ondansetron (ZOFRAN) injection 4 mg (has no administration in time range)    ED Course/ Medical Decision Making/ A&P                                 Medical Decision Making Amount and/or Complexity of Data Reviewed Labs: ordered. Radiology: ordered.  Risk Prescription drug management.    43 year old female presenting to the emergency department with chief complaint of abdominal pain.  The patient states that she developed left lower quadrant  abdominal pain which is now in her left upper quadrant.  She now endorses also epigastric pain.  No radiation to the back.  She endorses nausea, denies any vomiting.  She states that her last bowel movement was within the past day and she was relatively loose.  She had tried some supplements over-the-counter because she thought she may have been "backed up."  No fevers or chills.  On arrival, the patient was vitally stable, afebrile, tachycardic heart rate 109, not tachypneic, BP 132/90, saturating 98% on room air.  Patient presenting with epigastric tenderness on exam, diffuse pain however some focality in the left upper quadrant and epigastrium.  IV access was obtained and the patient was administered an IV fluid bolus and IV Zofran with associated IV fentanyl for pain control and nausea control.  Will order CT of the abdomen pelvis to further evaluate due to concern for potential pancreatitis, diverticulitis, gastritis, less likely bowel obstruction, consider viral syndrome, less likely choledocholithiasis, cholecystitis.   Signout given to Dr. Wallace Cullens to follow-up results of CT imaging and reassess the patient, ultimate disposition pending results of diagnostic testing and reassessment.   Final Clinical Impression(s) / ED Diagnoses Final diagnoses:  None    Rx / DC Orders ED Discharge Orders     None         Ernie Avena, MD 07/11/23 [id number]

## 2023-07-11 NOTE — ED Triage Notes (Signed)
 Pt presents via POV c/o LLQ abd pain starting yesterday. Reports abd is tender to palpation.

## 2023-07-11 NOTE — ED Notes (Signed)
 ED Provider at bedside.

## 2023-08-14 ENCOUNTER — Encounter: Payer: Self-pay | Admitting: Obstetrics & Gynecology

## 2023-08-14 ENCOUNTER — Ambulatory Visit: Payer: Managed Care, Other (non HMO) | Admitting: Obstetrics & Gynecology

## 2023-08-14 VITALS — BP 128/85 | HR 89 | Ht 65.0 in | Wt 224.0 lb

## 2023-08-14 DIAGNOSIS — Z1231 Encounter for screening mammogram for malignant neoplasm of breast: Secondary | ICD-10-CM | POA: Diagnosis not present

## 2023-08-14 DIAGNOSIS — N939 Abnormal uterine and vaginal bleeding, unspecified: Secondary | ICD-10-CM

## 2023-08-14 DIAGNOSIS — D219 Benign neoplasm of connective and other soft tissue, unspecified: Secondary | ICD-10-CM | POA: Diagnosis not present

## 2023-08-14 MED ORDER — NORETHINDRONE ACETATE 5 MG PO TABS: 10.0000 mg | ORAL_TABLET | Freq: Every day | ORAL | 2 refills | Status: DC

## 2023-08-14 NOTE — Progress Notes (Addendum)
 GYNECOLOGY OFFICE VISIT NOTE  Discussed the use of AI scribe software for clinical note transcription with the patient, who gave verbal consent to proceed.  History:   Maria Ho is a 43 y.o. [id number] here today for evaluation and management of long history of AUB and fibroids.  The patient has a ten-year history of abnormal uterine bleeding, and reports that the condition has progressively worsened. She has tried various treatments, including two Mirena  IUDs and several rounds of birth control, but none have been effective in managing or stopping the bleeding. The patient describes the bleeding as very heavy, lasting seven days, and requiring multiple hygiene products. She reports feeling lightheaded and dizzy due to the heavy bleeding, and has been diagnosed with anemia. She received a blood transfusion in February, and is getting iron  infusions. The patient has known fibroids, which are likely contributing to the bleeding. She expresses interest in a hysterectomy, but is concerned about the recovery time and potential work absence. She is open to other treatment options, including an ablation if it would be effective given her type of fibroids. She denies any current abnormal vaginal discharge, bleeding, pelvic pain or other concerns.     Past Medical History:  Diagnosis Date   Anemia    Anxiety    PTSD   Arrhythmia    Arthritis    Depression    Fibroid    Headache(784.0)    Migraines   HSV (herpes simplex virus) infection    no outbreak during current pregnancy   Migraine     Past Surgical History:  Procedure Laterality Date   NO PAST SURGERIES     The following portions of the patient's history were reviewed and updated as appropriate: allergies, current medications, past family history, past medical history, past social history, past surgical history and problem list.   Health Maintenance:  Normal pap and negative HRHPV on 05/27/2019.  Normal mammogram on 09/15/2017.    Review of Systems:  Pertinent items noted in HPI and remainder of comprehensive ROS otherwise negative.  Physical Exam:  BP 128/85   Pulse 89   Ht 5\' 5"  (1.651 m)   Wt 224 lb (101.6 kg)   LMP 08/05/2023 (Exact Date)   BMI 37.28 kg/m  CONSTITUTIONAL: Well-developed, well-nourished female in no acute distress.  HEENT:  Normocephalic, atraumatic. External right and left ear normal. No scleral icterus.  NECK: Normal range of motion, supple, no masses noted on observation SKIN: No rash noted. Not diaphoretic. No erythema. No pallor. MUSCULOSKELETAL: Normal range of motion. No edema noted. NEUROLOGIC: Alert and oriented to person, place, and time. Normal muscle tone coordination. No cranial nerve deficit noted. PSYCHIATRIC: Normal mood and affect. Normal behavior. Normal judgment and thought content. CARDIOVASCULAR: Normal heart rate noted RESPIRATORY: Effort and breath sounds normal, no problems with respiration noted ABDOMEN: No masses or other overt distention noted on observation, no tenderness reported   PELVIC: Deferred  Labs and Imaging    Latest Ref Rng & Units 07/11/2023    6:14 AM 06/03/2023   11:57 AM 04/20/2022    7:54 PM  CBC  WBC 4.0 - 10.5 K/uL 8.9  7.6  7.1   Hemoglobin 12.0 - 15.0 g/dL 56.2  6.9  8.9   Hematocrit 36.0 - 46.0 % 37.4  24.8  30.2   Platelets 150 - 400 K/uL 213  232  362       Assessment and Plan:     1. Breast cancer screening by mammogram  Mammogram to be scheduled for patient - MM 3D SCREENING MAMMOGRAM BILATERAL BREAST; Future  2. Abnormal uterine bleeding (AUB) 3. Fibroids (Primary) Abnormal Uterine Bleeding due to Uterine Fibroids Chronic heavy menstrual bleeding due to uterine fibroids causing anemia. Past imaging confirmed fibroids disrupting uterine lining (7 cm submucosal fibroid in 2023). Wants treatment that do not have a lot of downtime, does not want medication treatment for long term management.  Discussed treatment options including  Sonata, uterine fibroid embolization; robotic assisted hysterectomy; she opted for minimally invasive robotic-assisted hysterectomy. Explained need for preoperative endometrial biopsy and ultrasound.  - Ordered pelvic ultrasound to assess fibroids.  *US  PELVIC COMPLETE WITH TRANSVAGINAL; Future - Schedule endometrial biopsy and Pap smear, recommended premedication with Tylenol  and Ibuprofen  - Prescribed Aygestin  to manage bleeding until surgery *Norethindrone  (AYGESTIN ) 5 MG tablet; Take 2 tablets (10 mg total) by mouth daily. Can increase to 3 tablets daily while having heavy bleeding  Dispense: 68 tablet; Refill: 2 - Referred to Dr. Sola Ajewole for robotic hysterectomy consultation.  Routine preventative health maintenance measures emphasized. Please refer to After Visit Summary for other counseling recommendations.   Return in about 2 weeks (around 08/28/2023) for Pap and endometrial biopsy, also needs follow up with Dr. Elester Grim after ultrasound.    I spent 45 minutes dedicated to the care of this patient including pre-visit review of records, face to face time with the patient discussing her conditions and treatments, post visit ordering of medications and appropriate tests or procedures, coordinating care and documenting this visit encounter.    Future Appointments  Date Time Provider Department Center  08/21/2023  8:40 AM [facility]MM 1 [facility]MM [facility]  08/21/2023  9:00 AM [facility]  1 [facility]US  [facility]  09/08/2023  8:15 AM Kiki Pelton, MD [facility] None    Lenoard Rad, MD, FACOG Obstetrician & Gynecologist, [facility] for [employer], [facility]

## 2023-08-21 ENCOUNTER — Encounter (HOSPITAL_BASED_OUTPATIENT_CLINIC_OR_DEPARTMENT_OTHER): Payer: Self-pay | Admitting: Radiology

## 2023-08-21 ENCOUNTER — Ambulatory Visit (HOSPITAL_BASED_OUTPATIENT_CLINIC_OR_DEPARTMENT_OTHER)
Admission: RE | Admit: 2023-08-21 | Discharge: 2023-08-21 | Disposition: A | Discharge status: Home / Self Care | Source: Ambulatory Visit | Attending: Obstetrics & Gynecology | Admitting: Obstetrics & Gynecology

## 2023-08-21 DIAGNOSIS — Z1231 Encounter for screening mammogram for malignant neoplasm of breast: Secondary | ICD-10-CM

## 2023-08-21 DIAGNOSIS — N939 Abnormal uterine and vaginal bleeding, unspecified: Secondary | ICD-10-CM | POA: Diagnosis present

## 2023-08-21 DIAGNOSIS — D219 Benign neoplasm of connective and other soft tissue, unspecified: Secondary | ICD-10-CM | POA: Insufficient documentation

## 2023-08-24 ENCOUNTER — Encounter: Payer: Self-pay | Admitting: Obstetrics & Gynecology

## 2023-09-04 ENCOUNTER — Encounter: Payer: Self-pay | Admitting: Obstetrics & Gynecology

## 2023-09-08 ENCOUNTER — Other Ambulatory Visit: Admitting: Obstetrics and Gynecology

## 2023-09-08 NOTE — Progress Notes (Deleted)
    GYNECOLOGY VISIT  Patient name: Maria Ho MRN [id number]  Date of birth: July 28, 1980 Chief Complaint:   No chief complaint on file.   History:  Maria Ho is a 43 y.o. [id number] being seen today for ***.    Past Medical History:  Diagnosis Date   Anemia    Anxiety    PTSD   Arrhythmia    Arthritis    Depression    Fibroid    Headache(784.0)    Migraines   HSV (herpes simplex virus) infection    no outbreak during current pregnancy   Migraine     Past Surgical History:  Procedure Laterality Date   NO PAST SURGERIES      The following portions of the patient's history were reviewed and updated as appropriate: allergies, current medications, past family history, past medical history, past social history, past surgical history and problem list.   Health Maintenance:   Last pap ***. Results were: {Pap findings:25134}. H/O abnormal pap: {yes/yes***/no:23866} Last mammogram: ***. Results were: {normal, abnormal, n/a:23837}. Family h/o breast cancer: {yes***/no:23838}   Review of Systems:  {Ros - complete:30496} Comprehensive review of systems was otherwise negative.   Objective:  Physical Exam LMP 08/05/2023 (Exact Date)    Physical Exam   Labs and Imaging US  PELVIC COMPLETE WITH TRANSVAGINAL Result Date: 09/02/2023 CLINICAL DATA:  Abnormal uterine bleeding for over 10 years. History of fibroids. EXAM: TRANSABDOMINAL AND TRANSVAGINAL ULTRASOUND OF PELVIS TECHNIQUE: Both transabdominal and transvaginal ultrasound examinations of the pelvis were performed. Transabdominal technique was performed for global imaging of the pelvis including uterus, ovaries, adnexal regions, and pelvic cul-de-sac. It was necessary to proceed with endovaginal exam following the transabdominal exam to visualize the uterus and endometrium. COMPARISON:  10/29/2021 FINDINGS: Uterus Measurements: 16.0 x 11.6 x 14.9 = volume: 1446 mL. Multiple fibroids are seen: Fibroid 1: 9.8 x 8.9 x  10.4 cm-mid uterine body and fundus. Fibroid 2: 5.2 x 4.5 x 5.1 cm-right posterior uterine fundus Fibroid 3: 3.0 x 1.8 x 2.0 cm-right uterine fundus Endometrium Obscured by multiple fibroids.  Unable to measure. Right ovary Measurements: 2.8 x 2.4 x 2.3 cm = volume: 8.2 mL. Normal appearance. Left ovary Measurements: 2.6 x 1.6 x 2.4 cm = volume: 5.3 mL. Normal appearance. Other findings No abnormal free fluid. IMPRESSION: 1. Enlarged fibroid uterus. 2. Unable to evaluate and measure endometrium due to presence of multiple fibroids. Electronically Signed   By: Elester Grim M.D.   On: 09/02/2023 07:37   MM 3D SCREENING MAMMOGRAM BILATERAL BREAST Result Date: 08/24/2023 CLINICAL DATA:  Screening. EXAM: DIGITAL SCREENING BILATERAL MAMMOGRAM WITH TOMOSYNTHESIS AND CAD TECHNIQUE: Bilateral screening digital craniocaudal and mediolateral oblique mammograms were obtained. Bilateral screening digital breast tomosynthesis was performed. The images were evaluated with computer-aided detection. COMPARISON:  Previous exam(s). ACR Breast Density Category c: The breasts are heterogeneously dense, which may obscure small masses. FINDINGS: There are no findings suspicious for malignancy. IMPRESSION: No mammographic evidence of malignancy. A result letter of this screening mammogram will be mailed directly to the patient. RECOMMENDATION: Screening mammogram in one year. (Code:SM-B-01Y) BI-RADS CATEGORY  1: Negative. Electronically Signed   By: Allena Ito M.D.   On: 08/24/2023 08:53       Assessment & Plan:   There are no diagnoses linked to this encounter.   *** Routine preventative health maintenance measures emphasized.  Maria Pelton, MD [facility] for [facility], [facility]

## 2023-09-12 ENCOUNTER — Other Ambulatory Visit: Admitting: Obstetrics and Gynecology

## 2023-12-23 ENCOUNTER — Emergency Department (HOSPITAL_BASED_OUTPATIENT_CLINIC_OR_DEPARTMENT_OTHER)
Admission: EM | Admit: 2023-12-23 | Discharge: 2023-12-23 | Disposition: A | Discharge status: Home / Self Care | Attending: Emergency Medicine | Admitting: Emergency Medicine

## 2023-12-23 ENCOUNTER — Encounter (HOSPITAL_BASED_OUTPATIENT_CLINIC_OR_DEPARTMENT_OTHER): Payer: Self-pay | Admitting: Emergency Medicine

## 2023-12-23 ENCOUNTER — Other Ambulatory Visit: Payer: Self-pay

## 2023-12-23 DIAGNOSIS — D500 Iron deficiency anemia secondary to blood loss (chronic): Secondary | ICD-10-CM | POA: Diagnosis not present

## 2023-12-23 DIAGNOSIS — D259 Leiomyoma of uterus, unspecified: Secondary | ICD-10-CM | POA: Insufficient documentation

## 2023-12-23 DIAGNOSIS — R000 Tachycardia, unspecified: Secondary | ICD-10-CM | POA: Insufficient documentation

## 2023-12-23 DIAGNOSIS — N924 Excessive bleeding in the premenopausal period: Secondary | ICD-10-CM

## 2023-12-23 DIAGNOSIS — D219 Benign neoplasm of connective and other soft tissue, unspecified: Secondary | ICD-10-CM

## 2023-12-23 DIAGNOSIS — N920 Excessive and frequent menstruation with regular cycle: Secondary | ICD-10-CM | POA: Insufficient documentation

## 2023-12-23 DIAGNOSIS — N939 Abnormal uterine and vaginal bleeding, unspecified: Secondary | ICD-10-CM | POA: Diagnosis present

## 2023-12-23 LAB — CBC WITH DIFFERENTIAL/PLATELET
Abs Immature Granulocytes: 0.02 K/uL (ref 0.00–0.07)
Basophils Absolute: 0.1 K/uL (ref 0.0–0.1)
Basophils Relative: 1 %
Eosinophils Absolute: 0.3 K/uL (ref 0.0–0.5)
Eosinophils Relative: 4 %
HCT: 34 % — ABNORMAL LOW (ref 36.0–46.0)
Hemoglobin: 10.8 g/dL — ABNORMAL LOW (ref 12.0–15.0)
Immature Granulocytes: 0 %
Lymphocytes Relative: 21 %
Lymphs Abs: 1.5 K/uL (ref 0.7–4.0)
MCH: 23.8 pg — ABNORMAL LOW (ref 26.0–34.0)
MCHC: 31.8 g/dL (ref 30.0–36.0)
MCV: 74.9 fL — ABNORMAL LOW (ref 80.0–100.0)
Monocytes Absolute: 0.6 K/uL (ref 0.1–1.0)
Monocytes Relative: 9 %
Neutro Abs: 4.6 K/uL (ref 1.7–7.7)
Neutrophils Relative %: 65 %
Platelets: 187 K/uL (ref 150–400)
RBC: 4.54 MIL/uL (ref 3.87–5.11)
RDW: 16.9 % — ABNORMAL HIGH (ref 11.5–15.5)
WBC: 7.1 K/uL (ref 4.0–10.5)
nRBC: 0 % (ref 0.0–0.2)

## 2023-12-23 LAB — BASIC METABOLIC PANEL WITH GFR
Anion gap: 14 (ref 5–15)
BUN: 13 mg/dL (ref 6–20)
CO2: 19 mmol/L — ABNORMAL LOW (ref 22–32)
Calcium: 8.8 mg/dL — ABNORMAL LOW (ref 8.9–10.3)
Chloride: 105 mmol/L (ref 98–111)
Creatinine, Ser: 0.64 mg/dL (ref 0.44–1.00)
GFR, Estimated: >60 mL/min (ref >60)
Glucose, Bld: 115 mg/dL — ABNORMAL HIGH (ref 70–99)
Potassium: 3.8 mmol/L (ref 3.5–5.1)
Sodium: 138 mmol/L (ref 135–145)

## 2023-12-23 LAB — IRON AND TIBC
Iron: 17 ug/dL — ABNORMAL LOW (ref 28–170)
Saturation Ratios: 4 % — ABNORMAL LOW (ref 10.4–31.8)
TIBC: 454 ug/dL — ABNORMAL HIGH (ref 250–450)
UIBC: 437 ug/dL

## 2023-12-23 LAB — RETICULOCYTES
Immature Retic Fract: 18.9 % — ABNORMAL HIGH (ref 2.3–15.9)
RBC.: 4.55 MIL/uL (ref 3.87–5.11)
Retic Count, Absolute: 79.2 K/uL (ref 19.0–186.0)
Retic Ct Pct: 1.7 % (ref 0.4–3.1)

## 2023-12-23 LAB — FERRITIN: Ferritin: 9 ng/mL — ABNORMAL LOW (ref 11–307)

## 2023-12-23 LAB — VITAMIN B12: Vitamin B-12: 206 pg/mL (ref 180–914)

## 2023-12-23 LAB — FOLATE: Folate: 11.1 ng/mL (ref >5.9)

## 2023-12-23 LAB — HCG, SERUM, QUALITATIVE: Preg, Serum: NEGATIVE

## 2023-12-23 MED ORDER — SODIUM CHLORIDE 0.9 % IV BOLUS
1000.0000 mL | Freq: Once | INTRAVENOUS | Status: AC
  Administered 2023-12-23: 1000 mL via INTRAVENOUS

## 2023-12-23 MED ORDER — MEGESTROL ACETATE 400 MG/10ML PO SUSP: 80.0000 mg | Freq: Two times a day (BID) | ORAL | 1 refills | Status: DC

## 2023-12-23 NOTE — ED Provider Notes (Signed)
 [place] EMERGENCY DEPARTMENT AT [facility] Provider Note   CSN: [id number] Arrival date & time: 12/23/23  9348     Patient presents with: Vaginal Bleeding   Maria Ho is a 43 y.o. female.   Pt is a 43 yo female with pmhx significant for fibroids causing anemia, PTSD, migraines and arthritis.  Pt has been having issues with fibroids for years.  She did see her obgyn (Dr. Herchel; [place]) in April and they talked about a hysterectomy.  She was put on Aygestin  to help with the bleeding.  Pt never followed up and is no longer taking the Aygestin .  She said it did not really help.  She did not think she could afford the hysterectomy so that's why she never went back.  She's been having significant bleeding for 3 days.  She said she has to change her pads every hour and a half and they are soaked.  She feels like she's anemic again as she's sob and feels weak.       Prior to Admission medications   Medication Sig Start Date End Date Taking? Authorizing Provider  megestrol  (MEGACE ) 400 MG/10ML suspension Take 2 mLs (80 mg total) by mouth 2 (two) times daily. 12/23/23  Yes Dean Clarity, MD  busPIRone  (BUSPAR ) 10 MG tablet Take 10 mg by mouth 2 (two) times daily. Patient not taking: Reported on 08/14/2023 10/27/21   [provider]  ferrous sulfate  325 (65 FE) MG tablet Take 1 tablet (325 mg total) by mouth 3 (three) times daily with meals. Patient not taking: Reported on 08/14/2023 06/03/23   Francesca Elsie CROME, MD  fluconazole  (DIFLUCAN ) 150 MG tablet Take 1 tablet today.  Take second tablet 3 days later. 02/19/22   Joesph Shaver Scales, PA-C  lisdexamfetamine (VYVANSE) 60 MG capsule Take 60 mg by mouth every morning. Patient not taking: Reported on 08/14/2023    [provider]  norethindrone  (AYGESTIN ) 5 MG tablet Take 2 tablets (10 mg total) by mouth daily. Can increase to 3 tablets daily while having heavy bleeding 08/14/23   Anyanwu, Gloris LABOR, MD   ondansetron  (ZOFRAN ) 4 MG tablet Take 1 tablet (4 mg total) by mouth every 4 (four) hours as needed for nausea or vomiting. Patient not taking: Reported on 08/14/2023 07/11/23   Elnor Jayson LABOR, DO  pantoprazole  (PROTONIX ) 20 MG tablet Take 1 tablet (20 mg total) by mouth daily for 14 days. 07/11/23 07/25/23  Elnor Jayson LABOR, DO  sucralfate  (CARAFATE ) 1 g tablet Take 1 tablet (1 g total) by mouth with breakfast, with lunch, and with evening meal for 7 days. 07/11/23 07/18/23  Elnor Jayson A, DO  triamcinolone  cream (KENALOG ) 0.1 % Apply 1 Application topically 2 (two) times daily. Patient not taking: Reported on 08/14/2023 03/22/22   Christopher Savannah, PA-C    Allergies: Patient has no known allergies.    Review of Systems  Respiratory:  Positive for shortness of breath.   Neurological:  Positive for weakness.  All other systems reviewed and are negative.   Updated Vital Signs BP 124/89   Pulse 97   Temp 98.1 F (36.7 C) (Oral)   Resp 16   LMP 12/20/2023 (Approximate)   SpO2 98%   Physical Exam Vitals and nursing note reviewed.  Constitutional:      Appearance: Normal appearance. She is obese. She is ill-appearing.  HENT:     Head: Normocephalic and atraumatic.     Right Ear: External ear normal.  Left Ear: External ear normal.     Nose: Nose normal.     Mouth/Throat:     Mouth: Mucous membranes are moist.     Pharynx: Oropharynx is clear.  Eyes:     Extraocular Movements: Extraocular movements intact.     Conjunctiva/sclera: Conjunctivae normal.     Pupils: Pupils are equal, round, and reactive to light.  Cardiovascular:     Rate and Rhythm: Regular rhythm. Tachycardia present.     Pulses: Normal pulses.     Heart sounds: Normal heart sounds.  Pulmonary:     Effort: Pulmonary effort is normal.     Breath sounds: Normal breath sounds.  Abdominal:     General: Abdomen is flat. Bowel sounds are normal.     Palpations: Abdomen is soft.  Musculoskeletal:        General: Normal  range of motion.     Cervical back: Normal range of motion and neck supple.  Skin:    General: Skin is warm.     Capillary Refill: Capillary refill takes less than 2 seconds.     Coloration: Skin is pale.  Neurological:     General: No focal deficit present.     Mental Status: She is alert and oriented to person, place, and time.  Psychiatric:        Mood and Affect: Mood normal.        Behavior: Behavior normal.     (all labs ordered are listed, but only abnormal results are displayed) Labs Reviewed  BASIC METABOLIC PANEL WITH GFR - Abnormal; Notable for the following components:      Result Value   CO2 19 (*)    Glucose, Bld 115 (*)    Calcium 8.8 (*)    All other components within normal limits  CBC WITH DIFFERENTIAL/PLATELET - Abnormal; Notable for the following components:   Hemoglobin 10.8 (*)    HCT 34.0 (*)    MCV 74.9 (*)    MCH 23.8 (*)    RDW 16.9 (*)    All other components within normal limits  FERRITIN - Abnormal; Notable for the following components:   Ferritin 9 (*)    All other components within normal limits  RETICULOCYTES - Abnormal; Notable for the following components:   Immature Retic Fract 18.9 (*)    All other components within normal limits  HCG, SERUM, QUALITATIVE  VITAMIN B12  FOLATE  IRON  AND TIBC    EKG: EKG Interpretation Date/Time:  Saturday December 23 2023 07:27:35 EDT Ventricular Rate:  96 PR Interval:  133 QRS Duration:  86 QT Interval:  353 QTC Calculation: 447 R Axis:   5  Text Interpretation: Sinus rhythm No significant change since last tracing Confirmed by Dean Clarity [phone]) on 12/23/2023 7:29:49 AM  Radiology: No results found.   Procedures   Medications Ordered in the ED  sodium chloride  0.9 % bolus 1,000 mL (1,000 mLs Intravenous New Bag/Given 12/23/23 0732)                                    Medical Decision Making Amount and/or Complexity of Data Reviewed Labs: ordered.   This patient presents to the  ED for concern of vaginal bleeding, this involves an extensive number of treatment options, and is a complaint that carries with it a high risk of complications and morbidity.  The differential diagnosis includes menorrhagia, anemia, iron  deficiency  Co morbidities that complicate the patient evaluation  fibroids   Additional history obtained:  Additional history obtained from epic chart review  Lab Tests:  I Ordered, and personally interpreted labs.  The pertinent results include:  cbc with hgb 10.8, mcv 74.9; preg neg; bmp nl other than CO2 low at 19; ferritin level is low at 9   Cardiac Monitoring:  The patient was maintained on a cardiac monitor.  I personally viewed and interpreted the cardiac monitored which showed an underlying rhythm of: nsr   Medicines ordered and prescription drug management:  I ordered medication including ivfs  for sx  Reevaluation of the patient after these medicines showed that the patient improved I have reviewed the patients home medicines and have made adjustments as needed   Consultations Obtained:  I requested consultation with the obgyn (Dr. Cleatus),  and discussed lab and imaging findings as well as pertinent plan - she recommended megace  80 mg bid for a month.  She will have the office call to make an appt.  They have patient assistance programs to help with cost.   Problem List / ED Course:  Menorrhagia due to fibroids.  Megace  80 mg bid ordered Iron  deficiency anemia:  outpatient iron  infusion ordered.  Pt takes liver pills and she's told to continue taking.   Reevaluation:  After the interventions noted above, I reevaluated the patient and found that they have :improved   Social Determinants of Health:  Lives at home   Dispostion:  After consideration of the diagnostic results and the patients response to treatment, I feel that the patent would benefit from discharge with outpatient f/u.       Final diagnoses:   Menorrhagia with regular cycle  Iron  deficiency anemia due to chronic blood loss  Uterine leiomyoma, unspecified location    ED Discharge Orders          Ordered    Amb Referral to Intravenous Iron  Therapy       Comments: You have been referred to [facility] Infusion team for IV Iron  Infusions. The infusion pharmacy team will reach out to you with appointment information.    12/23/23 0814    megestrol  (MEGACE ) 400 MG/10ML suspension  2 times daily        12/23/23 9162               Dean Clarity, MD 12/23/23 [phone]

## 2023-12-23 NOTE — ED Triage Notes (Signed)
  Patient comes in with heavy vaginal bleeding that has been going on for 3 days.  Patient states she is anemic and has had issues like this before.  Patient endorses SOB and muscle weakness.  Patient states she has bled through 10-12 sanitary pads since yesterday morning.  Denies any pain.  No OTC meds.

## 2023-12-23 NOTE — Consult Note (Signed)
   OB/GYN Telephone Consult  Maria Ho is a 43 y.o. [id number] presenting with HVB in setting of know .   I was called for a consult regarding the care of this patient by [name].    The provider had a clinical question regarding management and follow up.    The provider presented the following relevant clinical information and I performed a chart review on the patient and reviewed available documentation:  Patient is a 43 yo G14P4 who has a known fibroid and HVB. She was previously counseled for hysterectomy by Dr. Herchel but patient had concerns about coverage so she did not follow up. She was started on Aygestin  but it didn't help so she stopped taking it.    BP 111/79   Pulse 84   Temp 98.1 F (36.7 C) (Oral)   Resp 19   LMP 12/20/2023 (Approximate)   SpO2 100%   Exam- performed by consulting provider   Recommendations:  - Megace  liquid 80 mg BID (2 ml BID) for a month - I sent a message to [facility] for f/u with Dr. Abigail or Dr. Erik to discuss hysterectomy ideally in 2 weeks.   Thank you for this consult and if additional recommendations are needed please call [phone] for the OB/GYN attending on service at [facility].   I spent approximately 5 minutes directly consulting with the provider and verbally discussing this case. Additionally 10 minutes minutes was spent performing chart review and documentation.   Vina Solian, MD Attending Obstetrician & Gynecologist, [facility] for [facility], [facility]

## 2023-12-25 ENCOUNTER — Telehealth (HOSPITAL_COMMUNITY): Payer: Self-pay | Admitting: Emergency Medicine

## 2023-12-25 ENCOUNTER — Telehealth: Payer: Self-pay | Admitting: Pharmacy Technician

## 2023-12-25 ENCOUNTER — Telehealth: Payer: Self-pay | Admitting: Emergency Medicine

## 2023-12-25 ENCOUNTER — Telehealth: Payer: Self-pay

## 2023-12-25 NOTE — Telephone Encounter (Signed)
 Auth Submission: NO AUTH NEEDED Site of care: Site of care: [facility] Payer: [employer] commercial Medication & CPT/J Code(s) submitted: Venofer  (Iron  Sucrose) J1756 Diagnosis Code:  Route of submission (phone, fax, portal):  Phone # Fax # Auth type: Buy/Bill PB Units/visits requested: 300mg  x 2 doses Reference number:  Approval from: 12/25/23 to 04/25/24

## 2023-12-25 NOTE — Telephone Encounter (Signed)
 error

## 2023-12-25 NOTE — Telephone Encounter (Signed)
 Patient referred to infusion pharmacy team for ambulatory infusion of IV iron .  Insurance - Advertising copywriter of care - Site of care: [facility] Dx code - D50.9 IV Iron  Therapy - Venofer  300 mg IV x 2. Patient received 300 mg IV as inpatient.  Infusion appointments - Scheduling team will schedule patient as soon as possible.    Rudi Knippenberg D. Betzaida Cremeens, PharmD

## 2024-01-04 ENCOUNTER — Ambulatory Visit: Admitting: *Deleted

## 2024-01-04 ENCOUNTER — Encounter: Payer: Self-pay | Admitting: Pulmonary Disease

## 2024-01-04 VITALS — BP 130/84 | HR 87 | Temp 98.3°F | Resp 16 | Ht 69.0 in | Wt 225.0 lb

## 2024-01-04 DIAGNOSIS — D509 Iron deficiency anemia, unspecified: Secondary | ICD-10-CM

## 2024-01-04 DIAGNOSIS — D649 Anemia, unspecified: Secondary | ICD-10-CM | POA: Insufficient documentation

## 2024-01-04 MED ORDER — IRON SUCROSE 300 MG IVPB - SIMPLE MED: 300.0000 mg | Freq: Once | Status: DC

## 2024-01-04 MED ORDER — SODIUM CHLORIDE 0.9 % IV SOLN
300.0000 mg | Freq: Once | INTRAVENOUS | Status: AC
  Administered 2024-01-04: 300 mg via INTRAVENOUS
  Filled 2024-01-04: qty 15

## 2024-01-04 MED ORDER — SODIUM CHLORIDE 0.9 % IV SOLN: 300.0000 mg | Freq: Once | INTRAVENOUS | Status: DC

## 2024-01-04 NOTE — Progress Notes (Signed)
 Diagnosis: Iron  Deficiency Anemia  Provider:  Mannam, Praveen MD  Procedure: IV Infusion  IV Type: Peripheral, IV Location: L Forearm  Venofer  (Iron  Sucrose), Dose: 300 mg  Infusion Start Time: 0835 am  Infusion Stop Time: 1041 am  Post Infusion IV Care: Observation period completed and Peripheral IV Discontinued  Discharge: Condition: Good, Destination: Home . AVS Declined  Performed by:  Trudy Lamarr LABOR, RN

## 2024-01-07 ENCOUNTER — Emergency Department (HOSPITAL_BASED_OUTPATIENT_CLINIC_OR_DEPARTMENT_OTHER)
Admission: EM | Admit: 2024-01-07 | Discharge: 2024-01-08 | Disposition: A | Discharge status: Home / Self Care | Attending: Emergency Medicine | Admitting: Emergency Medicine

## 2024-01-07 ENCOUNTER — Other Ambulatory Visit: Payer: Self-pay

## 2024-01-07 ENCOUNTER — Emergency Department (HOSPITAL_BASED_OUTPATIENT_CLINIC_OR_DEPARTMENT_OTHER)

## 2024-01-07 DIAGNOSIS — G43001 Migraine without aura, not intractable, with status migrainosus: Secondary | ICD-10-CM | POA: Insufficient documentation

## 2024-01-07 DIAGNOSIS — R519 Headache, unspecified: Secondary | ICD-10-CM | POA: Diagnosis present

## 2024-01-07 LAB — CBC WITH DIFFERENTIAL/PLATELET
Abs Immature Granulocytes: 0.02 K/uL (ref 0.00–0.07)
Basophils Absolute: 0.1 K/uL (ref 0.0–0.1)
Basophils Relative: 1 %
Eosinophils Absolute: 0.4 K/uL (ref 0.0–0.5)
Eosinophils Relative: 4 %
HCT: 36.8 % (ref 36.0–46.0)
Hemoglobin: 11.4 g/dL — ABNORMAL LOW (ref 12.0–15.0)
Immature Granulocytes: 0 %
Lymphocytes Relative: 26 %
Lymphs Abs: 2.4 K/uL (ref 0.7–4.0)
MCH: 22.7 pg — ABNORMAL LOW (ref 26.0–34.0)
MCHC: 31 g/dL (ref 30.0–36.0)
MCV: 73.3 fL — ABNORMAL LOW (ref 80.0–100.0)
Monocytes Absolute: 0.8 K/uL (ref 0.1–1.0)
Monocytes Relative: 9 %
Neutro Abs: 5.6 K/uL (ref 1.7–7.7)
Neutrophils Relative %: 60 %
Platelets: 306 K/uL (ref 150–400)
RBC: 5.02 MIL/uL (ref 3.87–5.11)
RDW: 18.6 % — ABNORMAL HIGH (ref 11.5–15.5)
WBC: 9.3 K/uL (ref 4.0–10.5)
nRBC: 0 % (ref 0.0–0.2)

## 2024-01-07 LAB — BASIC METABOLIC PANEL WITH GFR
Anion gap: 14 (ref 5–15)
BUN: 8 mg/dL (ref 6–20)
CO2: 17 mmol/L — ABNORMAL LOW (ref 22–32)
Calcium: 9.6 mg/dL (ref 8.9–10.3)
Chloride: 109 mmol/L (ref 98–111)
Creatinine, Ser: 0.73 mg/dL (ref 0.44–1.00)
GFR, Estimated: >60 mL/min (ref >60)
Glucose, Bld: 88 mg/dL (ref 70–99)
Potassium: 3.9 mmol/L (ref 3.5–5.1)
Sodium: 141 mmol/L (ref 135–145)

## 2024-01-07 LAB — RESP PANEL BY RT-PCR (RSV, FLU A&B, COVID)  RVPGX2
Influenza A by PCR: NEGATIVE
Influenza B by PCR: NEGATIVE
Resp Syncytial Virus by PCR: NEGATIVE
SARS Coronavirus 2 by RT PCR: NEGATIVE

## 2024-01-07 LAB — HCG, SERUM, QUALITATIVE: Preg, Serum: NEGATIVE

## 2024-01-07 MED ORDER — METOPROLOL TARTRATE 5 MG/5ML IV SOLN
5.0000 mg | Freq: Once | INTRAVENOUS | Status: AC
  Administered 2024-01-07: 5 mg via INTRAVENOUS
  Filled 2024-01-07: qty 5

## 2024-01-07 MED ORDER — LACTATED RINGERS IV BOLUS
1000.0000 mL | Freq: Once | INTRAVENOUS | Status: AC
  Administered 2024-01-07: 1000 mL via INTRAVENOUS

## 2024-01-07 MED ORDER — DIPHENHYDRAMINE HCL 50 MG/ML IJ SOLN
12.5000 mg | Freq: Once | INTRAMUSCULAR | Status: AC
  Administered 2024-01-07: 12.5 mg via INTRAVENOUS
  Filled 2024-01-07: qty 1

## 2024-01-07 MED ORDER — PROCHLORPERAZINE EDISYLATE 10 MG/2ML IJ SOLN
10.0000 mg | Freq: Once | INTRAMUSCULAR | Status: AC
  Administered 2024-01-07: 10 mg via INTRAVENOUS
  Filled 2024-01-07: qty 2

## 2024-01-07 MED ORDER — KETOROLAC TROMETHAMINE 15 MG/ML IJ SOLN
15.0000 mg | Freq: Once | INTRAMUSCULAR | Status: AC
  Administered 2024-01-07: 15 mg via INTRAVENOUS
  Filled 2024-01-07: qty 1

## 2024-01-07 MED ORDER — ACETAMINOPHEN 500 MG PO TABS
500.0000 mg | ORAL_TABLET | Freq: Once | ORAL | Status: AC
  Administered 2024-01-07: 500 mg via ORAL
  Filled 2024-01-07: qty 1

## 2024-01-07 MED ORDER — HYDROMORPHONE HCL 1 MG/ML IJ SOLN
0.5000 mg | Freq: Once | INTRAMUSCULAR | Status: AC
  Administered 2024-01-07: 0.5 mg via INTRAVENOUS
  Filled 2024-01-07: qty 1

## 2024-01-07 MED ORDER — BUTALBITAL-APAP-CAFFEINE 50-325-40 MG PO TABS
1.0000 | ORAL_TABLET | Freq: Once | ORAL | Status: AC
  Administered 2024-01-07: 1 via ORAL
  Filled 2024-01-07: qty 1

## 2024-01-07 NOTE — ED Provider Notes (Signed)
 [place] EMERGENCY DEPARTMENT AT [facility] Provider Note   CSN: [id number] Arrival date & time: 01/07/24  1835     Patient presents with: Headache   Maria Ho is a 43 y.o. female with history of migraines, presents with concern for a migraine that has been ongoing for the past week.  She states she has pain in the back of her head.  This is not associated with any nausea, vomiting, or light sensitivity.  She denies any head trauma.  Denies any neck pain or stiffness.  Denies any fever or chills.  Denies any other symptoms such as cough, sore throat.  She reports that her normal headaches are usually frontal and also associated with photosensitivity.  She took her home sumatriptan  today without relief of symptoms    Headache      Prior to Admission medications   Medication Sig Start Date End Date Taking? Authorizing Provider  busPIRone  (BUSPAR ) 10 MG tablet Take 10 mg by mouth 2 (two) times daily. Patient not taking: Reported on 08/14/2023 10/27/21   [provider]  ferrous sulfate  325 (65 FE) MG tablet Take 1 tablet (325 mg total) by mouth 3 (three) times daily with meals. Patient not taking: Reported on 08/14/2023 06/03/23   Francesca Elsie CROME, MD  fluconazole  (DIFLUCAN ) 150 MG tablet Take 1 tablet today.  Take second tablet 3 days later. 02/19/22   Joesph Shaver Scales, PA-C  lisdexamfetamine (VYVANSE) 60 MG capsule Take 60 mg by mouth every morning. Patient not taking: Reported on 08/14/2023    [provider]  megestrol  (MEGACE ) 400 MG/10ML suspension Take 2 mLs (80 mg total) by mouth 2 (two) times daily. 12/23/23   Dean Clarity, MD  norethindrone  (AYGESTIN ) 5 MG tablet Take 2 tablets (10 mg total) by mouth daily. Can increase to 3 tablets daily while having heavy bleeding 08/14/23   Anyanwu, Ugonna A, MD  ondansetron  (ZOFRAN ) 4 MG tablet Take 1 tablet (4 mg total) by mouth every 4 (four) hours as needed for nausea or vomiting. Patient not  taking: Reported on 08/14/2023 07/11/23   Elnor Jayson LABOR, DO  pantoprazole  (PROTONIX ) 20 MG tablet Take 1 tablet (20 mg total) by mouth daily for 14 days. 07/11/23 07/25/23  Elnor Jayson A, DO  sucralfate  (CARAFATE ) 1 g tablet Take 1 tablet (1 g total) by mouth with breakfast, with lunch, and with evening meal for 7 days. 07/11/23 07/18/23  Elnor Jayson A, DO  triamcinolone  cream (KENALOG ) 0.1 % Apply 1 Application topically 2 (two) times daily. Patient not taking: Reported on 08/14/2023 03/22/22   Christopher Savannah, PA-C    Allergies: Patient has no known allergies.    Review of Systems  Neurological:  Positive for headaches.    Updated Vital Signs BP (!) 140/97   Pulse 86   Temp 98.7 F (37.1 C) (Oral)   Resp 16   LMP 01/01/2024 (Approximate)   SpO2 100%   Physical Exam Vitals and nursing note reviewed.  Constitutional:      General: She is not in acute distress.    Appearance: She is well-developed.     Comments: In dark room, tearful  HENT:     Head: Normocephalic and atraumatic.  Eyes:     Extraocular Movements: Extraocular movements intact.     Conjunctiva/sclera: Conjunctivae normal.     Pupils: Pupils are equal, round, and reactive to light.  Neck:     Comments: No nuchal rigidity, able to bend neck up and down, left  and right without pain or difficulty Cardiovascular:     Rate and Rhythm: Normal rate and regular rhythm.     Heart sounds: No murmur heard. Pulmonary:     Effort: Pulmonary effort is normal. No respiratory distress.     Breath sounds: Normal breath sounds.  Abdominal:     Palpations: Abdomen is soft.     Tenderness: There is no abdominal tenderness.  Musculoskeletal:        General: No swelling.     Cervical back: Normal range of motion and neck supple.  Skin:    General: Skin is warm and dry.     Capillary Refill: Capillary refill takes less than 2 seconds.  Neurological:     General: No focal deficit present.     Mental Status: She is alert.   Psychiatric:        Mood and Affect: Mood normal.     (all labs ordered are listed, but only abnormal results are displayed) Labs Reviewed  CBC WITH DIFFERENTIAL/PLATELET - Abnormal; Notable for the following components:      Result Value   Hemoglobin 11.4 (*)    MCV 73.3 (*)    MCH 22.7 (*)    RDW 18.6 (*)    All other components within normal limits  BASIC METABOLIC PANEL WITH GFR - Abnormal; Notable for the following components:   CO2 17 (*)    All other components within normal limits  RESP PANEL BY RT-PCR (RSV, FLU A&B, COVID)  RVPGX2  HCG, SERUM, QUALITATIVE    EKG: None  Radiology: CT Head Wo Contrast Result Date: 01/07/2024 CLINICAL DATA:  Headache EXAM: CT HEAD WITHOUT CONTRAST TECHNIQUE: Contiguous axial images were obtained from the base of the skull through the vertex without intravenous contrast. RADIATION DOSE REDUCTION: This exam was performed according to the departmental dose-optimization program which includes automated exposure control, adjustment of the mA and/or kV according to patient size and/or use of iterative reconstruction technique. COMPARISON:  08/03/2007 FINDINGS: Brain: No acute intracranial abnormality. Specifically, no hemorrhage, hydrocephalus, mass lesion, acute infarction, or significant intracranial injury. Vascular: No hyperdense vessel or unexpected calcification. Skull: No acute calvarial abnormality. Sinuses/Orbits: No acute findings Other: None IMPRESSION: No acute intracranial abnormality. Electronically Signed   By: Franky Crease M.D.   On: 01/07/2024 21:22     Procedures   Medications Ordered in the ED  prochlorperazine  (COMPAZINE ) injection 10 mg (10 mg Intravenous Given 01/07/24 2047)  diphenhydrAMINE  (BENADRYL ) injection 12.5 mg (12.5 mg Intravenous Given 01/07/24 2047)  lactated ringers  bolus 1,000 mL (0 mLs Intravenous Stopped 01/07/24 2236)  metoprolol  tartrate (LOPRESSOR ) injection 5 mg (5 mg Intravenous Given 01/07/24 2046)  ketorolac   (TORADOL ) 15 MG/ML injection 15 mg (15 mg Intravenous Given 01/07/24 2139)  butalbital -acetaminophen -caffeine  (FIORICET ) 50-325-40 MG per tablet 1 tablet (1 tablet Oral Given 01/07/24 2243)  acetaminophen  (TYLENOL ) tablet 500 mg (500 mg Oral Given 01/07/24 2243)  HYDROmorphone  (DILAUDID ) injection 0.5 mg (0.5 mg Intravenous Given 01/07/24 2351)  metoprolol  tartrate (LOPRESSOR ) injection 5 mg (5 mg Intravenous Given 01/07/24 2351)    Clinical Course as of 01/08/24 0041  Sun Jan 07, 2024  2338 Reevaluated patient, she reports headache is at a 4 out of 10 and still wants to see if it can be completely resolved. She reports dilaudid  has helped headaches in the past, will try 0.5mg  [AF]  Mon Jan 08, 2024  0037 Reevaluated patient, reports headache has completely resolved.  She feels comfortable going home at this time. [AF]  Clinical Course User Index [AF] Veta Palma, PA-C                                 Medical Decision Making Amount and/or Complexity of Data Reviewed Labs: ordered. Radiology: ordered.  Risk OTC drugs. Prescription drug management.     Differential diagnosis includes but is not limited to Tension headache, migraine, temporal arteritis, trigeminal neuralgia, cluster headache, meningitis, concussion, intracranial mass, intracranial hemorrhage, carbon monoxide poisoning, sinus venous thrombosis, hypertensive emergency  ED Course:  Upon initial evaluation, patient is lying in a dark room, tearful.  She had an elevated blood pressure 150/107 upon arrival.  Afebrile, nontachycardic.  Reporting severe occipital headache that has been ongoing for a week.  No acute worsening.  She does not have any neurologic deficits on exam.  No nuchal rigidity, no fevers, low concern for meningitis at this time.  Given difference in headache today that has not responded to normal home medications, we will proceed with CT head imaging.  Labs Ordered: I Ordered, and personally interpreted  labs.  The pertinent results include:   CBC without leukocytosis.  Hemoglobin 11.4, at baseline BMP within normal limits Pregnancy negative COVID, flu, RSV negative  Imaging Studies ordered: I ordered imaging studies including CT head I independently visualized the imaging with scope of interpretation limited to determining acute life threatening conditions related to emergency care. Imaging showed no acute abnormalities I agree with the radiologist interpretation   Medications Given: Metoprolol  Compazine  Benadryl  Toradol  Tylenol  Fiorcet Dilaudid  LR bolus  Upon re-evaluation, patient reports headache is completely resolved.  Workup has been reassuring.  CT head without any explanation to her headaches today.  She does not have any neck pain, stiffness, fevers, or leukocytosis, no concern for meningitis or SAH at this time.  No head trauma, no concern for concussion.  Her blood pressures were elevated more than normal today, and question if this could have been contributing to her headache.  This was treated with metoprolol .  This could also be a different presentation of her normal migraines.  It is reassuring that headache has resolved with medications. I visualized patient ambulate to restroom without difficulty. She also states she seems to get migraines frequently around her menstrual cycle, which was the case this time.  She is stable and appropriate discharge home.    Impression: Migraine, resolved  Disposition:  The patient was discharged home with instructions to follow-up with her PCP within the next month regarding her blood pressures.  Obtain a referral from her PCP or schedule appointment with neurology if her headaches are becoming more frequent or not well-controlled with home medications. Return precautions given.   This chart was dictated using voice recognition software, Dragon. Despite the best efforts of this provider to proofread and correct errors, errors may  still occur which can change documentation meaning.       Final diagnoses:  Migraine without aura and with status migrainosus, not intractable    ED Discharge Orders     None          Veta Palma, PA-C 01/08/24 0041    Pamella Ozell LABOR, DO 01/17/24 1229

## 2024-01-07 NOTE — ED Triage Notes (Signed)
 Pt reports migraine headache for past week, tx with at home Rx medications with no relief.  Pt denies n/v/light sensitivity or other symptoms.

## 2024-01-08 ENCOUNTER — Encounter: Payer: Self-pay | Admitting: Obstetrics and Gynecology

## 2024-01-08 ENCOUNTER — Ambulatory Visit: Admitting: Obstetrics and Gynecology

## 2024-01-08 VITALS — BP 135/89 | HR 111 | Temp 98.7°F | Ht 69.5 in | Wt 220.0 lb

## 2024-01-08 DIAGNOSIS — D219 Benign neoplasm of connective and other soft tissue, unspecified: Secondary | ICD-10-CM

## 2024-01-08 DIAGNOSIS — N939 Abnormal uterine and vaginal bleeding, unspecified: Secondary | ICD-10-CM

## 2024-01-08 NOTE — Discharge Instructions (Addendum)
 You were seen today for your headache No specific cause was found today for your headache. It may have been a migraine or other cause of headache. Stress, anxiety, fatigue, weather changes are common triggers for headaches.   You may take up to 1000mg  of tylenol  every 6 hours as needed for headache. Do not take more then 4g per day.   You may use up to 600mg  ibuprofen  every 6 hours as needed for headache.  Do not exceed 2.4g of ibuprofen  per day.  You may continue to take your home sumatriptan  as prescribed for your migraines.   Tests performed today include: CT of your head which was normal and did not show any serious cause of your headache  Your blood counts showed a slightly low hemoglobin at 11.4, but this does seem better than your previous labs. Your electrolytes and kidney function are normal today Your pregnancy test was negative   Follow-up instructions: Please follow-up with your primary care provider if your headaches are not being controlled with over-the-counter medications like Tylenol  and ibuprofen  and sumatriptan , or becoming more frequent.   Please schedule follow-up with neurology or have your PCP refer you if your headaches are becoming more frequent or not being controlled well with your home medications.  Your blood pressure was elevated today at 140/97.  Please check your blood pressures at home and record your values to bring to your PCP.  Please follow-up with your PCP within the next month regarding your blood pressure.  Return instructions:  Please return to the Emergency Department if you experience worsening symptoms. Return if the medications do not resolve your headache, if it recurs, or if you have multiple episodes of vomiting or cannot keep down fluids. Return if you have a change from your usual headache. RETURN IMMEDIATELY IF you: Develop a sudden, severe headache Develop confusion or become poorly responsive or faint Develop a fever above 100.86F or  problem breathing Have a change in speech, vision, swallowing, or understanding Develop new weakness, numbness, tingling, incoordination in your arms or legs Have a seizure Have lots of pain in your neck Please return if you have any other emergent concerns.

## 2024-01-08 NOTE — Progress Notes (Signed)
 43 y.o. GYN presents for Surgical Consult.

## 2024-01-11 ENCOUNTER — Ambulatory Visit

## 2024-01-11 MED ORDER — SODIUM CHLORIDE 0.9 % IV SOLN
300.0000 mg | Freq: Once | INTRAVENOUS | Status: AC
  Filled 2024-01-11: qty 15

## 2024-01-14 NOTE — Progress Notes (Signed)
 GYNECOLOGY VISIT  Patient name: Maria Ho MRN [id number]  Date of birth: [birth year] Chief Complaint:   Follow-up   History:  Discussed the use of AI scribe software for clinical note transcription with the patient, who gave verbal consent to proceed.  History of Present Illness Maria Ho is a 43 year old female with uterine fibroids who presents with concerns about surgical options and financial assistance for treatment.  She experiences heavy menstrual bleeding, with periods occurring every two weeks, and has been diagnosed with uterine fibroids. The bleeding has been severe enough to require emergency room visits, where she was given Moxeza (likely Megace ); she noted that her cycle was ending at that time and the medication appeared to stop the bleeding.  She has been taking Megace  to manage the bleeding but experiences daily migraines since starting the medication. She is uncertain if the headaches are related to the medication or other factors. Despite the migraines, she continues to take Megace  to control the bleeding.  Financial constraints have delayed her ability to proceed with surgery for her fibroids. She has insurance through her job but struggles with the out-of-pocket costs, which has prevented her from attending necessary preoperative appointments. She is seeking information on financial assistance programs to help cover the costs of surgery.  Her family has offered to support her financially during her recovery period, which has provided some relief. She has previously required blood transfusions due to excessive menstrual bleeding and is open to receiving them again if necessary during surgery.  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 She is exploring options to minimize time away from work.     The  following portions of the patient's history were reviewed and updated as appropriate: allergies, current medications, past family history, past medical history, past social history, past surgical history and problem list.   Health Maintenance:   Last pap     Component Value Date/Time   DIAGPAP  05/27/2019 1533    - Negative for intraepithelial lesion or malignancy (NILM)   HPVHIGH Negative 05/27/2019 1533   ADEQPAP  05/27/2019 1533    Satisfactory for evaluation; transformation zone component PRESENT.    Health Maintenance  Topic Date Due   COVID-19 Vaccine (1) Never done   Hepatitis C Screening  Never done   DTaP/Tdap/Td vaccine (1 - Tdap) Never done   HPV Vaccine (1 - Risk 3-dose SCDM series) Never done   Hepatitis B Vaccine (2 of 3 - 19+ 3-dose series) 12/27/2013   Flu Shot  Never done   Pap with HPV screening  05/26/2024   Breast Cancer Screening  08/20/2025   HIV Screening  Completed   Pneumococcal Vaccine  Aged Out   Meningitis B Vaccine  Aged Out      Review of Systems:  Pertinent items are noted in HPI. Comprehensive review of systems was otherwise negative.   Objective:  Physical Exam BP 135/89   Pulse (!) 111   Temp 98.7 F (37.1 C)   Ht 5' 9.5 (1.765 m)   Wt 220 lb (99.8 kg)   LMP 01/01/2024 (Approximate)   BMI 32.02 kg/m    Physical Exam   Labs and Imaging CT Head Wo Contrast Result Date: 01/07/2024 CLINICAL DATA:  Headache EXAM: CT HEAD WITHOUT CONTRAST TECHNIQUE: Contiguous axial images were obtained from  the base of the skull through the vertex without intravenous contrast. RADIATION DOSE REDUCTION: This exam was performed according to the departmental dose-optimization program which includes automated exposure control, adjustment of the mA and/or kV according to patient size and/or use of iterative reconstruction technique. COMPARISON:  08/03/2007 FINDINGS: Brain: No acute intracranial abnormality. Specifically, no hemorrhage, hydrocephalus, mass  lesion, acute infarction, or significant intracranial injury. Vascular: No hyperdense vessel or unexpected calcification. Skull: No acute calvarial abnormality. Sinuses/Orbits: No acute findings Other: None IMPRESSION: No acute intracranial abnormality. Electronically Signed   By: Franky Crease M.D.   On: 01/07/2024 21:22       Assessment & Plan:  Assessment and Plan Assessment & Plan Uterine fibroids with abnormal uterine bleeding Chronic uterine fibroids causing abnormal bleeding for over ten years, worsening. Discussed surgical options: hysterectomy, uterine artery embolization, endometrial ablation. Hysterectomy most definitive, preserving ovaries to prevent premature menopause. Risks: bleeding, infection, organ injury, <10% complications. Uterine artery embolization shorter recovery, requires MRI, performed by interventional radiologist. Endometrial ablation likely ineffective due to fibroid size. Lupron discussed, not preferred due to side effects. - Fill out financial assistance program form at front desk. - Contact [facility] for surgical scheduling and cost coordination. - Proceed with hysterectomy as soon as possible, preference for earliest available surgeon. - Continue Megace  to manage bleeding until surgery.  Anemia due to uterine bleeding Anemia secondary to chronic uterine bleeding from fibroids. Previous transfusions due to significant blood loss. Discussed need for surgical intervention to prevent further anemia.  Chronic daily migraine headaches Chronic daily migraines potentially exacerbated by Megace , used to manage uterine bleeding. Discussed difficulty managing migraines while on Megace , necessity to control bleeding until surgery. - Continue Megace  to manage bleeding until surgery.   [name], MD [facility] for [facility], [facility]

## 2024-01-29 ENCOUNTER — Ambulatory Visit

## 2024-01-29 VITALS — BP 155/96 | HR 84 | Temp 99.3°F | Resp 18 | Ht 69.0 in | Wt 226.4 lb

## 2024-01-29 DIAGNOSIS — D500 Iron deficiency anemia secondary to blood loss (chronic): Secondary | ICD-10-CM

## 2024-01-29 DIAGNOSIS — D509 Iron deficiency anemia, unspecified: Secondary | ICD-10-CM

## 2024-01-29 DIAGNOSIS — D649 Anemia, unspecified: Secondary | ICD-10-CM

## 2024-01-29 MED ORDER — SODIUM CHLORIDE 0.9 % IV SOLN
300.0000 mg | Freq: Once | INTRAVENOUS | Status: AC
  Administered 2024-01-29: 300 mg via INTRAVENOUS
  Filled 2024-01-29: qty 15

## 2024-01-29 NOTE — Progress Notes (Signed)
 Diagnosis: Iron  Deficiency Anemia  Provider:  Praveen Mannam MD  Procedure: IV Infusion  IV Type: Peripheral, IV Location: L Forearm  Venofer  (Iron  Sucrose), Dose: 300 mg  Infusion Start Time: 0835  Infusion Stop Time: 1013  Post Infusion IV Care: Patient declined observation and Peripheral IV Discontinued  Discharge: Condition: Good, Destination: Home . AVS Declined  Performed by:  Alletta Mattos, RN

## 2024-02-15 ENCOUNTER — Telehealth: Payer: Self-pay

## 2024-02-15 NOTE — Telephone Encounter (Signed)
 I left a voicemail confirming that I am aware that patient needs to cancel her 04/30/24 surgery. I advised that I did not have the schedule for January. I asked patient to call me back if needed.

## 2024-04-30 DIAGNOSIS — D219 Benign neoplasm of connective and other soft tissue, unspecified: Secondary | ICD-10-CM

## 2024-05-14 ENCOUNTER — Ambulatory Visit (INDEPENDENT_AMBULATORY_CARE_PROVIDER_SITE_OTHER): Payer: Self-pay | Admitting: Obstetrics & Gynecology

## 2024-05-14 ENCOUNTER — Other Ambulatory Visit (HOSPITAL_COMMUNITY)
Admission: RE | Admit: 2024-05-14 | Discharge: 2024-05-14 | Disposition: A | Discharge status: Home / Self Care | Source: Ambulatory Visit | Attending: Obstetrics & Gynecology | Admitting: Obstetrics & Gynecology

## 2024-05-14 ENCOUNTER — Encounter: Payer: Self-pay | Admitting: Obstetrics & Gynecology

## 2024-05-14 ENCOUNTER — Other Ambulatory Visit (HOSPITAL_COMMUNITY): Admission: RE | Admit: 2024-05-14 | Source: Ambulatory Visit | Admitting: Obstetrics & Gynecology

## 2024-05-14 VITALS — BP 136/94 | HR 102 | Wt 226.0 lb

## 2024-05-14 DIAGNOSIS — Z3202 Encounter for pregnancy test, result negative: Secondary | ICD-10-CM | POA: Diagnosis not present

## 2024-05-14 DIAGNOSIS — Z1151 Encounter for screening for human papillomavirus (HPV): Secondary | ICD-10-CM | POA: Insufficient documentation

## 2024-05-14 DIAGNOSIS — N939 Abnormal uterine and vaginal bleeding, unspecified: Secondary | ICD-10-CM | POA: Diagnosis not present

## 2024-05-14 DIAGNOSIS — Z124 Encounter for screening for malignant neoplasm of cervix: Secondary | ICD-10-CM | POA: Diagnosis not present

## 2024-05-14 DIAGNOSIS — D259 Leiomyoma of uterus, unspecified: Secondary | ICD-10-CM | POA: Insufficient documentation

## 2024-05-14 DIAGNOSIS — Z01419 Encounter for gynecological examination (general) (routine) without abnormal findings: Secondary | ICD-10-CM | POA: Diagnosis present

## 2024-05-14 LAB — POCT URINE PREGNANCY: Preg Test, Ur: NEGATIVE

## 2024-05-14 NOTE — Progress Notes (Signed)
 44 y.o. GYN presents for ENDO Bx for AUB prior to Hysterectomy, scheduled 05/23/2024.  UPT Negative

## 2024-05-14 NOTE — Addendum Note (Signed)
 Addended by: Tammie Ellsworth J on: 05/14/2024 03:03 PM   Modules accepted: Orders

## 2024-05-14 NOTE — Progress Notes (Signed)
 She is scheduled for [facility] with Dr. Jeralyn 1/22 and comes for EMBx, needs pap. Patient given informed consent, signed copy in the chart, time out was performed. Appropriate time out taken. . The patient was placed in the lithotomy position and the cervix brought into view with sterile speculum.Pap obtained  Portio of cervix cleansed x 2 with betadine swabs.  A tenaculum was placed in the anterior lip of the cervix.  The uterus was sounded for depth of 10 cm. A pipelle was introduced to into the uterus, suction created,  and an endometrial sample was obtained. All equipment was removed and accounted for.  The patient tolerated the procedure well.    Patient given post procedure instructions.   Eveline Lynwood MATSU, MD 05/14/2024

## 2024-05-15 LAB — CYTOLOGY - PAP
Comment: NEGATIVE
Diagnosis: NEGATIVE
High risk HPV: NEGATIVE

## 2024-05-16 LAB — SURGICAL PATHOLOGY

## 2024-05-17 ENCOUNTER — Ambulatory Visit: Payer: Self-pay | Admitting: Obstetrics & Gynecology

## 2024-05-21 NOTE — Progress Notes (Signed)
 Spoke w/ via phone for pre-op interview---[facility] needs dos----  UPT       Lab results------PAT appointment on 1/21 at 0900 T&S, CBC COVID test -----patient states asymptomatic no test needed Arrive at -------0530 NPO after MN NO Solid Food.  Clear liquids from MN until---sip of water with AM med Pre-Surgery Ensure or G2:  Med rec completed Medications to take morning of surgery ----- Diabetic medication -----  GLP1 agonist last dose: GLP1 instructions:  Patient instructed no nail polish to be worn day of surgery Patient instructed to bring photo id and insurance card day of surgery Patient aware to have Driver (ride ) / caregiver    for 24 hours after surgery - Son Gerrit Cline [phone] Patient Special Instructions ----- Pre-Op special Instructions -----  Patient verbalized understanding of instructions that were given at this phone interview. Patient denies chest pain, sob, fever, cough at the interview.

## 2024-05-21 NOTE — Progress Notes (Signed)
" ° ° ° °  Pre-operative CHG Bathing Instructions   You can play a key role in reducing the risk of infection after surgery. Your skin needs to be as free of germs as possible. You can reduce the number of germs on your skin by washing with CHG (chlorhexidine  gluconate) soap before surgery. CHG is an antiseptic soap that kills germs and continues to kill germs even after washing.   DO NOT use if you have an allergy to chlorhexidine /CHG or antibacterial soaps. If your skin becomes reddened or irritated, stop using the CHG and notify one of our RNs at [phone].              TAKE A SHOWER THE NIGHT BEFORE SURGERY   Please keep in mind the following:  DO NOT shave, including legs and underarms, 48 hours prior to surgery.   You may shave your face before/day of surgery.  Place clean sheets on your bed the night before surgery Use a clean washcloth (not used since being washed) for shower. DO NOT sleep with pet's night before surgery.  CHG Shower Instructions:  Wash your face and private area with normal soap. If you choose to wash your hair, wash first with your normal shampoo.  After you use shampoo/soap, rinse your hair and body thoroughly to remove shampoo/soap residue.  Turn the water  OFF and apply half the bottle of CHG soap to a CLEAN washcloth.  Apply CHG soap ONLY FROM YOUR NECK DOWN TO YOUR TOES (washing for 3-5 minutes)  DO NOT use CHG soap on face, private areas, open wounds, or sores.  Pay special attention to the area where your surgery is being performed.  If you are having back surgery, having someone wash your back for you may be helpful. Wait 2 minutes after CHG soap is applied, then you may rinse off the CHG soap.  Pat dry with a clean towel  Put on clean pajamas    Additional instructions for the day of surgery: If you choose, you may shower the morning of surgery with an antibacterial soap.  DO NOT APPLY any lotions, deodorants, cologne, or perfumes.   Do not wear  jewelry or makeup Do not wear nail polish, gel polish, artificial nails, or any other type of covering on natural nails (fingers and toes) Do not bring valuables to the hospital. [facility] is not responsible for valuables/personal belongings. Put on clean/comfortable clothes.  Please brush your teeth.  Ask your nurse before applying any prescription medications to the skin.    "

## 2024-05-22 ENCOUNTER — Encounter (HOSPITAL_COMMUNITY)
Admission: RE | Admit: 2024-05-22 | Discharge: 2024-05-22 | Disposition: A | Discharge status: Home / Self Care | Source: Ambulatory Visit | Attending: Obstetrics and Gynecology | Admitting: Obstetrics and Gynecology

## 2024-05-22 DIAGNOSIS — D251 Intramural leiomyoma of uterus: Secondary | ICD-10-CM | POA: Diagnosis not present

## 2024-05-22 DIAGNOSIS — N939 Abnormal uterine and vaginal bleeding, unspecified: Secondary | ICD-10-CM | POA: Diagnosis present

## 2024-05-22 LAB — CBC
HCT: 36 % (ref 36.0–46.0)
Hemoglobin: 10.9 g/dL — ABNORMAL LOW (ref 12.0–15.0)
MCH: 22.3 pg — ABNORMAL LOW (ref 26.0–34.0)
MCHC: 30.3 g/dL (ref 30.0–36.0)
MCV: 73.6 fL — ABNORMAL LOW (ref 80.0–100.0)
Platelets: 262 K/uL (ref 150–400)
RBC: 4.89 MIL/uL (ref 3.87–5.11)
RDW: 16.7 % — ABNORMAL HIGH (ref 11.5–15.5)
WBC: 5.5 K/uL (ref 4.0–10.5)
nRBC: 0 % (ref 0.0–0.2)

## 2024-05-22 LAB — TYPE AND SCREEN
ABO/RH(D): O POS
Antibody Screen: NEGATIVE

## 2024-05-22 NOTE — Anesthesia Preprocedure Evaluation (Signed)
"                                    Anesthesia Evaluation  Patient identified by MRN, date of birth, ID band Patient awake    Reviewed: Allergy & Precautions, NPO status , Patient's Chart, lab work & pertinent test results  History of Anesthesia Complications Negative for: history of anesthetic complications  Airway Mallampati: II  TM Distance: >3 FB Neck ROM: Full    Dental no notable dental hx. (+) Teeth Intact   Pulmonary neg pulmonary ROS, neg sleep apnea, neg COPD, Patient abstained from smoking.Not current smoker   Pulmonary exam normal breath sounds clear to auscultation       Cardiovascular Exercise Tolerance: Good METS(-) hypertension(-) CAD and (-) Past MI negative cardio ROS (-) dysrhythmias  Rhythm:Regular Rate:Normal - Systolic murmurs    Neuro/Psych  Headaches PSYCHIATRIC DISORDERS Anxiety Depression       GI/Hepatic ,neg GERD  ,,(+)     substance abuse  marijuana useDaily MJ use, none today   Endo/Other  neg diabetes    Renal/GU negative Renal ROS     Musculoskeletal   Abdominal  (+) + obese  Peds  Hematology  (+) Blood dyscrasia, anemia   Anesthesia Other Findings Past Medical History: No date: Anemia No date: Anxiety     Comment:  PTSD No date: Arrhythmia No date: Arthritis No date: Depression No date: Fibroid No date: Headache(784.0)     Comment:  Migraines No date: HSV (herpes simplex virus) infection     Comment:  no outbreak during current pregnancy No date: Migraine  Reproductive/Obstetrics                              Anesthesia Physical Anesthesia Plan  ASA: 2  Anesthesia Plan: General   Post-op Pain Management: Tylenol  PO (pre-op)*, Toradol  IV (intra-op)* and Regional block*   Induction: Intravenous  PONV Risk Score and Plan: 4 or greater and TIVA, Midazolam , Propofol  infusion, Dexamethasone  and Ondansetron   Airway Management Planned: Oral ETT  Additional Equipment:  None  Intra-op Plan:   Post-operative Plan: Extubation in OR  Informed Consent: I have reviewed the patients History and Physical, chart, labs and discussed the procedure including the risks, benefits and alternatives for the proposed anesthesia with the patient or authorized representative who has indicated his/her understanding and acceptance.     Dental advisory given  Plan Discussed with: CRNA and Surgeon  Anesthesia Plan Comments: (Discussed risks of anesthesia with patient, including PONV, sore throat, lip/dental/eye damage. Rare risks discussed as well, such as cardiorespiratory and neurological sequelae, and allergic reactions. Discussed the role of CRNA in patient's perioperative care. Patient understands. I discussed with the patient the r/b/a of bilateral Transversus Abdominus Plane (TAP) blocks, including: - Bleeding - Infection - Damage to surrounding structures such as bowel, blood vessels, nerves - Allergic reaction to local anesthetic - Poor or nonfunctional block  Patient understands and agrees. )         Anesthesia Quick Evaluation  "

## 2024-05-23 ENCOUNTER — Encounter (HOSPITAL_COMMUNITY): Payer: Self-pay | Admitting: Anesthesiology

## 2024-05-23 ENCOUNTER — Encounter (HOSPITAL_COMMUNITY)
Admission: RE | Disposition: A | Discharge status: Home / Self Care | Payer: Self-pay | Source: Home / Self Care | Attending: Obstetrics and Gynecology

## 2024-05-23 ENCOUNTER — Other Ambulatory Visit: Payer: Self-pay

## 2024-05-23 ENCOUNTER — Ambulatory Visit (HOSPITAL_COMMUNITY): Payer: Self-pay | Admitting: Anesthesiology

## 2024-05-23 ENCOUNTER — Other Ambulatory Visit (HOSPITAL_COMMUNITY): Payer: Self-pay

## 2024-05-23 ENCOUNTER — Encounter: Payer: Self-pay | Admitting: Hematology and Oncology

## 2024-05-23 ENCOUNTER — Observation Stay (HOSPITAL_COMMUNITY)
Admission: RE | Admit: 2024-05-23 | Discharge: 2024-05-23 | Disposition: A | Discharge status: Home / Self Care | Attending: Obstetrics and Gynecology | Admitting: Obstetrics and Gynecology

## 2024-05-23 ENCOUNTER — Encounter (HOSPITAL_COMMUNITY): Payer: Self-pay | Admitting: Obstetrics and Gynecology

## 2024-05-23 DIAGNOSIS — D251 Intramural leiomyoma of uterus: Secondary | ICD-10-CM | POA: Diagnosis not present

## 2024-05-23 DIAGNOSIS — N939 Abnormal uterine and vaginal bleeding, unspecified: Secondary | ICD-10-CM | POA: Diagnosis present

## 2024-05-23 DIAGNOSIS — Z01818 Encounter for other preprocedural examination: Secondary | ICD-10-CM

## 2024-05-23 DIAGNOSIS — D259 Leiomyoma of uterus, unspecified: Secondary | ICD-10-CM

## 2024-05-23 DIAGNOSIS — N938 Other specified abnormal uterine and vaginal bleeding: Secondary | ICD-10-CM | POA: Diagnosis not present

## 2024-05-23 DIAGNOSIS — D219 Benign neoplasm of connective and other soft tissue, unspecified: Secondary | ICD-10-CM

## 2024-05-23 DIAGNOSIS — Z90710 Acquired absence of both cervix and uterus: Principal | ICD-10-CM | POA: Diagnosis present

## 2024-05-23 LAB — POCT PREGNANCY, URINE: Preg Test, Ur: NEGATIVE

## 2024-05-23 MED ORDER — BUPIVACAINE HCL (PF) 0.25 % IJ SOLN
INTRAMUSCULAR | Status: AC
  Filled 2024-05-23: qty 30

## 2024-05-23 MED ORDER — PROPOFOL 1000 MG/100ML IV EMUL
INTRAVENOUS | Status: AC
  Filled 2024-05-23: qty 100

## 2024-05-23 MED ORDER — ONDANSETRON HCL 4 MG PO TABS: 4.0000 mg | ORAL_TABLET | Freq: Four times a day (QID) | ORAL | Status: DC | PRN

## 2024-05-23 MED ORDER — LABETALOL HCL 5 MG/ML IV SOLN
INTRAVENOUS | Status: AC
  Filled 2024-05-23: qty 4

## 2024-05-23 MED ORDER — PANTOPRAZOLE SODIUM 40 MG PO TBEC
40.0000 mg | DELAYED_RELEASE_TABLET | Freq: Every day | ORAL | Status: DC
  Administered 2024-05-23: 40 mg via ORAL
  Filled 2024-05-23: qty 1

## 2024-05-23 MED ORDER — ACETAMINOPHEN 500 MG PO TABS
500.0000 mg | ORAL_TABLET | Freq: Four times a day (QID) | ORAL | 1 refills | Status: AC | PRN
  Filled 2024-05-23: qty 120, 30d supply, fill #0

## 2024-05-23 MED ORDER — POVIDONE-IODINE 10 % EX SWAB
2.0000 | Freq: Once | CUTANEOUS | Status: AC
  Administered 2024-05-23: 2 via TOPICAL

## 2024-05-23 MED ORDER — ROCURONIUM BROMIDE 10 MG/ML (PF) SYRINGE
PREFILLED_SYRINGE | INTRAVENOUS | Status: AC
  Filled 2024-05-23: qty 10

## 2024-05-23 MED ORDER — POLYETHYLENE GLYCOL 3350 17 GM/SCOOP PO POWD
17.0000 g | Freq: Every day | ORAL | 1 refills | Status: AC
  Filled 2024-05-23: qty 238, 14d supply, fill #0

## 2024-05-23 MED ORDER — PROPOFOL 10 MG/ML IV BOLUS
INTRAVENOUS | Status: AC
  Filled 2024-05-23: qty 20

## 2024-05-23 MED ORDER — PROPOFOL 10 MG/ML IV BOLUS
INTRAVENOUS | Status: DC | PRN
  Administered 2024-05-23: 40 mg via INTRAVENOUS
  Administered 2024-05-23: 125 ug/kg/min via INTRAVENOUS
  Administered 2024-05-23: 160 mg via INTRAVENOUS

## 2024-05-23 MED ORDER — GABAPENTIN 100 MG PO CAPS
100.0000 mg | ORAL_CAPSULE | Freq: Two times a day (BID) | ORAL | Status: DC
  Administered 2024-05-23: 100 mg via ORAL
  Filled 2024-05-23: qty 1

## 2024-05-23 MED ORDER — ACETAMINOPHEN 500 MG PO TABS
1000.0000 mg | ORAL_TABLET | Freq: Four times a day (QID) | ORAL | Status: DC
  Administered 2024-05-23: 1000 mg via ORAL
  Filled 2024-05-23: qty 2

## 2024-05-23 MED ORDER — DEXAMETHASONE SOD PHOSPHATE PF 10 MG/ML IJ SOLN
INTRAMUSCULAR | Status: DC | PRN
  Administered 2024-05-23: 6 mg via INTRAVENOUS

## 2024-05-23 MED ORDER — IBUPROFEN 600 MG PO TABS: 600.0000 mg | ORAL_TABLET | Freq: Four times a day (QID) | ORAL | Status: DC

## 2024-05-23 MED ORDER — OXYCODONE HCL 5 MG PO TABS: 5.0000 mg | ORAL_TABLET | ORAL | Status: DC | PRN

## 2024-05-23 MED ORDER — MENTHOL 3 MG MT LOZG: 1.0000 | LOZENGE | OROMUCOSAL | Status: DC | PRN

## 2024-05-23 MED ORDER — FLUORESCEIN SODIUM 10 % IV SOLN
INTRAVENOUS | Status: AC
  Filled 2024-05-23: qty 5

## 2024-05-23 MED ORDER — SIMETHICONE 80 MG PO CHEW: 80.0000 mg | CHEWABLE_TABLET | Freq: Four times a day (QID) | ORAL | Status: DC | PRN

## 2024-05-23 MED ORDER — BISACODYL 10 MG RE SUPP: 10.0000 mg | Freq: Every day | RECTAL | Status: DC | PRN

## 2024-05-23 MED ORDER — DROPERIDOL 2.5 MG/ML IJ SOLN: 0.6250 mg | Freq: Once | INTRAMUSCULAR | Status: DC | PRN

## 2024-05-23 MED ORDER — OXYCODONE HCL 5 MG PO TABS: 5.0000 mg | ORAL_TABLET | Freq: Once | ORAL | Status: DC | PRN

## 2024-05-23 MED ORDER — SODIUM CHLORIDE 0.9 % IR SOLN
Status: DC | PRN
  Administered 2024-05-23: 1000 mL
  Administered 2024-05-23: 1000 mL via INTRAVESICAL

## 2024-05-23 MED ORDER — ORAL CARE MOUTH RINSE: 15.0000 mL | Freq: Once | OROMUCOSAL | Status: AC

## 2024-05-23 MED ORDER — DEXMEDETOMIDINE HCL IN NACL 80 MCG/20ML IV SOLN
INTRAVENOUS | Status: AC
  Filled 2024-05-23: qty 20

## 2024-05-23 MED ORDER — ONDANSETRON HCL 4 MG/2ML IJ SOLN
INTRAMUSCULAR | Status: DC | PRN
  Administered 2024-05-23: 4 mg via INTRAVENOUS

## 2024-05-23 MED ORDER — LIDOCAINE 2% (20 MG/ML) 5 ML SYRINGE
INTRAMUSCULAR | Status: DC | PRN
  Administered 2024-05-23: 100 mg via INTRAVENOUS

## 2024-05-23 MED ORDER — FENTANYL CITRATE (PF) 100 MCG/2ML IJ SOLN
25.0000 ug | INTRAMUSCULAR | Status: DC | PRN
  Administered 2024-05-23: 25 ug via INTRAVENOUS
  Administered 2024-05-23: 50 ug via INTRAVENOUS
  Administered 2024-05-23: 25 ug via INTRAVENOUS

## 2024-05-23 MED ORDER — FENTANYL CITRATE (PF) 250 MCG/5ML IJ SOLN
INTRAMUSCULAR | Status: AC
  Filled 2024-05-23: qty 5

## 2024-05-23 MED ORDER — HYDROMORPHONE HCL 1 MG/ML IJ SOLN
0.2000 mg | INTRAMUSCULAR | Status: DC | PRN
  Administered 2024-05-23: 0.6 mg via INTRAVENOUS
  Filled 2024-05-23: qty 1

## 2024-05-23 MED ORDER — ACETAMINOPHEN 500 MG PO TABS
ORAL_TABLET | ORAL | Status: AC
  Filled 2024-05-23: qty 2

## 2024-05-23 MED ORDER — CHLORHEXIDINE GLUCONATE 0.12 % MT SOLN
15.0000 mL | Freq: Once | OROMUCOSAL | Status: AC
  Administered 2024-05-23: 15 mL via OROMUCOSAL

## 2024-05-23 MED ORDER — LIDOCAINE 2% (20 MG/ML) 5 ML SYRINGE
INTRAMUSCULAR | Status: AC
  Filled 2024-05-23: qty 5

## 2024-05-23 MED ORDER — ALUM & MAG HYDROXIDE-SIMETH 200-200-20 MG/5ML PO SUSP: 30.0000 mL | ORAL | Status: DC | PRN

## 2024-05-23 MED ORDER — LACTATED RINGERS IV SOLN: INTRAVENOUS | Status: DC

## 2024-05-23 MED ORDER — ONDANSETRON HCL 4 MG/2ML IJ SOLN
INTRAMUSCULAR | Status: AC
  Filled 2024-05-23: qty 2

## 2024-05-23 MED ORDER — OXYCODONE HCL 5 MG PO TABS
10.0000 mg | ORAL_TABLET | Freq: Once | ORAL | Status: AC
  Administered 2024-05-23: 10 mg via ORAL
  Filled 2024-05-23: qty 2

## 2024-05-23 MED ORDER — OXYCODONE HCL 5 MG/5ML PO SOLN: 5.0000 mg | Freq: Once | ORAL | Status: DC | PRN

## 2024-05-23 MED ORDER — OXYCODONE HCL 5 MG PO TABS
5.0000 mg | ORAL_TABLET | ORAL | 0 refills | Status: AC | PRN
  Filled 2024-05-23: qty 15, 3d supply, fill #0

## 2024-05-23 MED ORDER — MAGNESIUM CITRATE PO SOLN: 1.0000 | Freq: Once | ORAL | Status: DC | PRN

## 2024-05-23 MED ORDER — HYDROMORPHONE HCL 1 MG/ML IJ SOLN: 0.2000 mg | INTRAMUSCULAR | Status: DC | PRN

## 2024-05-23 MED ORDER — ONDANSETRON HCL 4 MG/2ML IJ SOLN: 4.0000 mg | Freq: Four times a day (QID) | INTRAMUSCULAR | Status: DC | PRN

## 2024-05-23 MED ORDER — KETOROLAC TROMETHAMINE 30 MG/ML IJ SOLN
30.0000 mg | Freq: Four times a day (QID) | INTRAMUSCULAR | Status: DC
  Administered 2024-05-23: 30 mg via INTRAVENOUS
  Filled 2024-05-23: qty 1

## 2024-05-23 MED ORDER — ROCURONIUM BROMIDE 10 MG/ML (PF) SYRINGE
PREFILLED_SYRINGE | INTRAVENOUS | Status: DC | PRN
  Administered 2024-05-23 (×2): 60 mg via INTRAVENOUS

## 2024-05-23 MED ORDER — IBUPROFEN 800 MG PO TABS
800.0000 mg | ORAL_TABLET | Freq: Three times a day (TID) | ORAL | 1 refills | Status: AC | PRN
  Filled 2024-05-23: qty 90, 30d supply, fill #0

## 2024-05-23 MED ORDER — FENTANYL CITRATE (PF) 250 MCG/5ML IJ SOLN
INTRAMUSCULAR | Status: DC | PRN
  Administered 2024-05-23: 50 ug via INTRAVENOUS
  Administered 2024-05-23 (×2): 100 ug via INTRAVENOUS

## 2024-05-23 MED ORDER — LABETALOL HCL 5 MG/ML IV SOLN
INTRAVENOUS | Status: DC | PRN
  Administered 2024-05-23: 5 mg via INTRAVENOUS
  Administered 2024-05-23: 2.5 mg via INTRAVENOUS

## 2024-05-23 MED ORDER — DOCUSATE SODIUM 100 MG PO CAPS: 100.0000 mg | ORAL_CAPSULE | Freq: Two times a day (BID) | ORAL | Status: DC

## 2024-05-23 MED ORDER — 0.9 % SODIUM CHLORIDE (POUR BTL) OPTIME
TOPICAL | Status: DC | PRN
  Administered 2024-05-23: 1000 mL

## 2024-05-23 MED ORDER — SENNOSIDES-DOCUSATE SODIUM 8.6-50 MG PO TABS: 1.0000 | ORAL_TABLET | Freq: Every evening | ORAL | Status: DC | PRN

## 2024-05-23 MED ORDER — FENTANYL CITRATE (PF) 100 MCG/2ML IJ SOLN
INTRAMUSCULAR | Status: AC
  Filled 2024-05-23: qty 2

## 2024-05-23 MED ORDER — GLYCOPYRROLATE PF 0.2 MG/ML IJ SOSY
PREFILLED_SYRINGE | INTRAMUSCULAR | Status: AC
  Filled 2024-05-23: qty 1

## 2024-05-23 MED ORDER — ACETAMINOPHEN 500 MG PO TABS
1000.0000 mg | ORAL_TABLET | ORAL | Status: AC
  Administered 2024-05-23: 1000 mg via ORAL

## 2024-05-23 MED ORDER — FLUORESCEIN SODIUM 10 % IV SOLN
INTRAVENOUS | Status: DC | PRN
  Administered 2024-05-23: .25 mL via INTRAVENOUS

## 2024-05-23 MED ORDER — BUPIVACAINE HCL (PF) 0.25 % IJ SOLN
INTRAMUSCULAR | Status: DC | PRN
  Administered 2024-05-23: 50 mL

## 2024-05-23 MED ORDER — CEFAZOLIN SODIUM-DEXTROSE 2-4 GM/100ML-% IV SOLN
2.0000 g | INTRAVENOUS | Status: AC
  Administered 2024-05-23: 2 g via INTRAVENOUS

## 2024-05-23 MED ORDER — DEXAMETHASONE SOD PHOSPHATE PF 10 MG/ML IJ SOLN
INTRAMUSCULAR | Status: AC
  Filled 2024-05-23: qty 1

## 2024-05-23 MED ORDER — MIDAZOLAM HCL 2 MG/2ML IJ SOLN
INTRAMUSCULAR | Status: AC
  Filled 2024-05-23: qty 2

## 2024-05-23 MED ORDER — CEFAZOLIN SODIUM-DEXTROSE 2-4 GM/100ML-% IV SOLN
INTRAVENOUS | Status: AC
  Filled 2024-05-23: qty 100

## 2024-05-23 MED ORDER — CHLORHEXIDINE GLUCONATE 0.12 % MT SOLN
OROMUCOSAL | Status: AC
  Filled 2024-05-23: qty 15

## 2024-05-23 MED ORDER — DEXMEDETOMIDINE HCL IN NACL 80 MCG/20ML IV SOLN
INTRAVENOUS | Status: DC | PRN
  Administered 2024-05-23: 8 ug via INTRAVENOUS
  Administered 2024-05-23: 4 ug via INTRAVENOUS

## 2024-05-23 MED ORDER — POLYETHYLENE GLYCOL 3350 17 G PO PACK: 17.0000 g | PACK | Freq: Every day | ORAL | Status: DC | PRN

## 2024-05-23 MED ORDER — KETOROLAC TROMETHAMINE 30 MG/ML IJ SOLN: 30.0000 mg | Freq: Four times a day (QID) | INTRAMUSCULAR | Status: DC

## 2024-05-23 MED ORDER — ZOLPIDEM TARTRATE 5 MG PO TABS: 5.0000 mg | ORAL_TABLET | Freq: Every evening | ORAL | Status: DC | PRN

## 2024-05-23 MED ORDER — MIDAZOLAM HCL (PF) 2 MG/2ML IJ SOLN
INTRAMUSCULAR | Status: DC | PRN
  Administered 2024-05-23: 2 mg via INTRAVENOUS

## 2024-05-23 MED ORDER — SUGAMMADEX SODIUM 200 MG/2ML IV SOLN
INTRAVENOUS | Status: DC | PRN
  Administered 2024-05-23: 200 mg via INTRAVENOUS

## 2024-05-23 MED ORDER — BUPIVACAINE LIPOSOME 1.3 % IJ SUSP
INTRAMUSCULAR | Status: AC
  Filled 2024-05-23: qty 20

## 2024-05-23 NOTE — Transfer of Care (Signed)
 Immediate Anesthesia Transfer of Care Note  Patient: Maria Ho  Procedure(s) Performed: HYSTERECTOMY, TOTAL, ROBOT-ASSISTED WITH BILATERAL SALPINGECTOMY (Pelvis) CYSTOSCOPY (Bladder)  Patient Location: PACU  Anesthesia Type:General  Level of Consciousness: awake, alert , oriented, and patient cooperative  Airway & Oxygen Therapy: Patient Spontanous Breathing and Patient connected to face mask oxygen  Post-op Assessment: Report given to RN and Post -op Vital signs reviewed and stable  Post vital signs: Reviewed and stable  Last Vitals:  Vitals Value Taken Time  BP 130/73 05/23/24 10:31  Temp 36.5 C 05/23/24 10:31  Pulse 57 05/23/24 10:36  Resp 24 05/23/24 10:36  SpO2 100 % 05/23/24 10:36  Vitals shown include unfiled device data.  Last Pain:  Vitals:   05/23/24 0605  TempSrc: Oral  PainSc: 0-No pain      Patients Stated Pain Goal: 5 (05/23/24 9394)  Complications: There were no known notable events for this encounter.

## 2024-05-23 NOTE — Progress Notes (Signed)
" °   05/23/24 1715  Departure Condition  Departure Condition Good  Mobility at [employer]  Patient/Caregiver Teaching Teach Back Method Used;Discharge instructions reviewed;Prescriptions reviewed;Follow-up care reviewed;Pain management discussed;Admission discussed;Medications discussed;Patient/caregiver verbalized understanding  Departure Mode With friend   Pt alert and oriented x4, vital signs and pain stable at time of discharge. Discharge instructions and medications reviewed, pt verbalized understanding. "

## 2024-05-23 NOTE — Brief Op Note (Signed)
 05/23/2024  7:30 AM  9:58 AM  PATIENT:  Maria Ho  44 y.o. female  PRE-OPERATIVE DIAGNOSIS:  AUB Fibroids  POST-OPERATIVE DIAGNOSIS:  AUBFibroids  PROCEDURE:  Procedures: HYSTERECTOMY, TOTAL, ROBOT-ASSISTED WITH BILATERAL SALPINGECTOMY (N/A) CYSTOSCOPY (N/A)  SURGEON:  Surgeons and Role:    DEWAINE Jeralyn Crutch, MD - Primary  PHYSICIAN ASSISTANT: Jorene Moats, PA  ASSISTANTS: above   ANESTHESIA:   general and TAP block  EBL:  50 mL   BLOOD ADMINISTERED:none  DRAINS: none   LOCAL MEDICATIONS USED:  BUPIVICAINE   SPECIMEN:  Source of Specimen:  Uterus, cervix, bilateral fallopian tubes  DISPOSITION OF SPECIMEN:  PATHOLOGY  COUNTS:  YES  TOURNIQUET:  * No tourniquets in log *  DICTATION: .Note written in EPIC  PLAN OF CARE: extended recovery  PATIENT DISPOSITION:  PACU - hemodynamically stable.   Delay start of Pharmacological VTE agent (>24hrs) due to surgical blood loss or risk of bleeding: not applicable

## 2024-05-23 NOTE — Op Note (Signed)
 Maria Ho PROCEDURE DATE: 05/23/2024  PREOPERATIVE DIAGNOSIS: abnormal uterine bleeding, fibroids  POSTOPERATIVE DIAGNOSIS: abnormal uterine bleeding, fibroids PROCEDURE:    robotic assisted total laparoscopic hysterectomy, bilateral salpingectomy, cystoscopy   SURGEON: [name], MD ASSISTANT:  Jorene Moats, PA  An experienced assistant was required given the standard of surgical care given the complexity of the case.  This assistant was needed for exposure, dissection, suctioning, retraction, instrument exchange, and for overall help during the procedure.  INDICATIONS: 44 y.o. [id number] with AUB-L.  Risks of surgery were discussed with the patient including but not limited to: bleeding which may require transfusion; infection which may require antibiotics; injury to surrounding organs; need for additional procedures including laparotomy;  and other postoperative/anesthesia complications. Written informed consent was obtained.    FINDINGS:  Normal external genitalia, 18 wk size mobile uterus with abnormal contours.  Laparoscopically: normal upper abdominal survey, enlarged fibroid uterus, normal bilateral fallopian tubes, normal bilateral ovaries, bilateral ureters seen, normal anterior cul de sac, normal posterior cul de sac Cystoscopically: normal bladder wall without apparent injury, bilateral ureteral orifices, fluorescein -stained urine from bilateral ureteral orifices   ANESTHESIA: General, TAP block (see anesthesia procedure note) INTRAVENOUS FLUIDS:  1400 ml of LR ESTIMATED BLOOD LOSS:  50 ml URINE OUTPUT: 200 ml SPECIMENS: uterus, cervix, bilateral fallopian tubes (821 g) COMPLICATIONS:  None immediate.   PROCEDURE: The risks, benefits, and alternatives of surgery were explained, understood, and accepted. Consents were signed. All questions were answered. She was taken to the operating room and general anesthesia was applied without complication. She was placed in the  dorsal lithotomy position and her abdomen and vagina were prepped and draped after she had been carefully positioned on the table. A bimanual exam revealed a 18 week size uterus that was mobile. Her adnexa were not enlarged. A Foley catheter was placed and it drained clear throughout the case. A speculum was placed and the cervix visualized. The cervix was measured and the uterus was sounded to 11 cm. A Rumi uterine manipulator was placed without difficulty, using a 10 cm tip and 4.0 cm cup.  Gloves were changed and attention was turned to the abdomen. An 8mm incision was made in the LUQ and an optiview airseal trocar was introduced into the abdomen. The Entry was confirmed with low opening intraabdominal pressure and visualization and the abdomen was then insufflated. After good pneumoperitoneum was established, the abdomen was surveyed including the upper abdomen. She was placed in Trendelenburg position and ports were placed in appropriate positions on her abdomen to allow maximum exposure during the robotic case. Specifically, trocars were placed supraumbilically, in the LLQ, RLQ, and RUQ under direct laparoscopic visualization. The robot was docked and I proceeded with a robotic portion of the case.  The pelvis was inspected and the uterus was found to have an enlarged uterus.  The ureters and the infundibulopelvic ligaments were identified. The left mesosalpinx was cauterized and divided. The left utero-ovarian ligament was cauterized and cut. The left round ligament was identified, cauterized and ligated and the bladder flap was initiated anteriorly. The posterior leaf of the broad ligament was dissected and divided. The uterine vessels were identified and cauterized well above the cut to decreased blood supply to the uterus.. Attention turned to the contralateral side where the same was carried out. The lower uterine segment peritoneum was further divided until the pubocervical fascia was identified. The  bladder was pushed out of the operative site. The left uterine vessels were skeletonized, cauterized, divided  and lateralized to the cup edge. The same was carried out on the right side. Once the cup was cleared an anterior colpotomy was made. The colpotomy incision was extended circumferentially, following the blue outline of the Rumi manipulator posteriorly and completed anteriorly. All pedicles were hemostatic.  The uterus was not able to be removed from the vagina. At the time the uterine manipulator was removed from the uterus and a 17cm alexis specimen retrieval bag was introduced through the vagina. The intact uterus was placed in the bag and the opening of the bag brought through the vagina. A small alexis wound retractor was placed in the vagina. The uterus was manually morcellated until it was completely removed. Attention was returned to the abdomen. The vaginal cuff was closed with 0 v-loc suture in a running fashion.  Excellent hemostasis was noted throughout. The pelvis was irrigated. The intraabdominal pressure was lowered assess hemostasis. After determining excellent hemostasis, the robot was undocked. At this point I performed cystoscopy. The cystoscopy revealed fluorescein  stained urine ejecting from both ureters.   The skin from all of the other ports was closed with 4-0 vicryl. The patient was then extubated and taken to recovery in stable condition.   Sponge, lap and needle counts were correct x 2.    [name], MD Minimally Invasive Gynecologic Surgery  Obstetrics and Gynecology, [facility] for [facility], [facility] 05/23/2024

## 2024-05-23 NOTE — Anesthesia Procedure Notes (Signed)
 Anesthesia Regional Block: TAP block   Pre-Anesthetic Checklist: , timeout performed,  Correct Patient, Correct Site, Correct Laterality,  Correct Procedure, Correct Position, site marked,  Risks and benefits discussed,  Surgical consent,  Pre-op evaluation,  At surgeon's request and post-op pain management  Laterality: Right and Left  Prep: chloraprep       Needles:  Injection technique: Single-shot  Needle Type: Other     Needle Length: 4cm  Needle Gauge: 25     Additional Needles:   Narrative:  Start time: 05/23/2024 7:45 AM End time: 05/23/2024 7:48 AM Injection made incrementally with aspirations every 5 mL.  Performed by: Personally  Anesthesiologist: Boone Fess, MD  Additional Notes: Patient's chart reviewed and they were deemed appropriate candidate for procedure, per surgeon's request. Patient educated about risks, benefits, and alternatives of the block including but not limited to: temporary or permanent nerve damage, bleeding, infection, damage to surround tissues, intestinal perforation, block failure, local anesthetic toxicity. They expressed understanding. A formal time-out was conducted after induction of anesthesia consistent with institution rules.  Full monitors utilized. The site was prepped with skin prep and allowed to dry, and sterile gloves were used. A high frequency linear ultrasound probe with probe cover was utilized throughout. External oblique, internal oblique, and transversus abdominus muscles visualized and appeared anatomically normal. Aspiration performed every 5ml. Blood vessels and peritoneum were avoided. All injections were performed without resistance and free of blood and paresthesias. The patient tolerated the procedure well.  Injectate: 50ml 0.25% bupvacaine (25ml per side)

## 2024-05-23 NOTE — H&P (Signed)
 " OB/GYN Pre-Op History and Physical  Maria Ho is a 44 y.o. [id number] presenting for surgical management of AUB.       Past Medical History:  Diagnosis Date   Anemia    Anxiety    PTSD   Arrhythmia    Arthritis    Depression    Fibroid    Headache(784.0)    Migraines   HSV (herpes simplex virus) infection    no outbreak during current pregnancy   Migraine     Past Surgical History:  Procedure Laterality Date   NO PAST SURGERIES     WISDOM TOOTH EXTRACTION     age 19    OB History  Gravida Para Term Preterm AB Living  14 4 4  10 4   SAB IAB Ectopic Multiple Live Births  1 9   4     # Outcome Date GA Lbr Len/2nd Weight Sex Type Anes PTL Lv  14 Term 12/07/11 [redacted]w[redacted]d 04:59 / 00:07  F Vag-Spont EPI  LIV  13 Term 2003    M Vag-Spont   LIV  12 Term 2002    M Vag-Spont   LIV  11 Term 1997    M Vag-Spont EPI  LIV  10 IAB           9 IAB           8 IAB           7 IAB           6 IAB           5 IAB           4 IAB           3 IAB           2 IAB           1 SAB             Social History   Socioeconomic History   Marital status: Single    Spouse name: Not on file   Number of children: 3   Years of education: Not on file   Highest education level: Not on file  Occupational History    Employer: [employer]  Tobacco Use   Smoking status: Never   Smokeless tobacco: Never  Vaping Use   Vaping status: Never Used  Substance and Sexual Activity   Alcohol use: Yes    Comment: occasionally   Drug use: Yes    Types: Marijuana    Comment: 2 days ago   Sexual activity: Yes    Birth control/protection: None  Other Topics Concern   Not on file  Social History Narrative   Lives with three children.   Occasional caffeine .   Social Drivers of Health   Tobacco Use: Low Risk (05/23/2024)   Patient History    Smoking Tobacco Use: Never    Smokeless Tobacco Use: Never    Passive Exposure: Not on file  Financial Resource Strain: High Risk (07/17/2023)   Received  from [facility]   Overall Financial Resource Strain (CARDIA)    Difficulty of Paying Living Expenses: Very hard  Food Insecurity: Food Insecurity Present (07/17/2023)   Received from [facility]   Epic    Within the past 12 months, you worried that your food would run out before you got the money to buy more.: Often true    Within the past 12 months, the food you bought just didn't last and you  didn't have money to get more.: Often true  Transportation Needs: No Transportation Needs (07/17/2023)   Received from [facility]   PRAPARE - Transportation    Lack of Transportation (Medical): No    Lack of Transportation (Non-Medical): No  Physical Activity: Unknown (07/17/2023)   Received from [facility]   Exercise Vital Sign    On average, how many days per week do you engage in moderate to strenuous exercise (like a brisk walk)?: 0 days    Minutes of Exercise per Session: Not on file  Stress: Stress Concern Present (07/17/2023)   Received from [facility] - Occupational Stress Questionnaire    Feeling of Stress : Very much  Social Connections: Socially Integrated (07/17/2023)   Received from [facility]   Social Network    How would you rate your social network (family, work, friends)?: Good participation with social networks  Depression (PHQ2-9): Not on file  Alcohol Screen: Not on file  Housing: High Risk (07/17/2023)   Received from [facility]    In the last 12 months, was there a time when you were not able to pay the mortgage or rent on time?: Yes    Number of Times Moved in the Last Year: Not on file    At any time in the past 12 months, were you homeless or living in a shelter (including now)?: No  Utilities: Not At Risk (07/17/2023)   Received from [facility] Utilities    Threatened with loss of utilities: No  Health Literacy: Not on file    Family History  Problem Relation Age of Onset    Post-traumatic stress disorder Father    Alcohol abuse Father    Drug abuse Father    Healthy Mother    Diabetes Maternal Grandmother    Cancer Paternal Grandmother        Great-Grandparent   Cancer Paternal Grandfather        Great-Gradnparent    Medications Prior to Admission  Medication Sig Dispense Refill Last Dose/Taking   guanFACINE (TENEX) 1 MG tablet Take 1 mg by mouth at bedtime.   Past Week   lisdexamfetamine (VYVANSE) 60 MG capsule Take 60 mg by mouth every morning.   Past Week   propranolol (INDERAL) 20 MG tablet Take 20 mg by mouth 2 (two) times daily.   05/23/2024 at  4:00 AM   fluconazole  (DIFLUCAN ) 150 MG tablet Take 1 tablet today.  Take second tablet 3 days later. 2 tablet 0 Unknown    Allergies[1]  Review of Systems: Negative except for what is mentioned in HPI.     Physical Exam: BP (!) 134/90   Pulse 82   Temp 97.9 F (36.6 C) (Oral)   Resp 17   Ht 5' 9 (1.753 m)   Wt 102.1 kg   LMP 05/16/2024 (Approximate)   SpO2 100%   BMI 33.23 kg/m  CONSTITUTIONAL: Well-developed, well-nourished and in no acute distress.  HENT:  Normocephalic, atraumatic, External right and left ear normal. Oropharynx is clear and moist EYES: Conjunctivae and EOM are normal. Pupils are equal, round, and reactive to light. No scleral icterus.  NECK: Normal range of motion, supple, no masses SKIN: Skin is warm and dry. No rash noted. Not diaphoretic. No erythema. No pallor. NEUROLGIC: Alert and oriented to person, place, and time. Normal reflexes, muscle tone coordination. No cranial nerve deficit noted. PSYCHIATRIC: Normal mood and affect. Normal behavior.  Normal judgment and thought content. RESPIRATORY: Normal effort PELVIC: Deferred   Pertinent Labs/Studies:   Results for orders placed or performed during the hospital encounter of 05/23/24 (from the past 72 hours)  CBC per protocol     Status: Abnormal   Collection Time: 05/22/24  9:03 AM  Result Value Ref Range   WBC  5.5 4.0 - 10.5 K/uL   RBC 4.89 3.87 - 5.11 MIL/uL   Hemoglobin 10.9 (L) 12.0 - 15.0 g/dL   HCT 63.9 63.9 - 53.9 %   MCV 73.6 (L) 80.0 - 100.0 fL   MCH 22.3 (L) 26.0 - 34.0 pg   MCHC 30.3 30.0 - 36.0 g/dL   RDW 83.2 (H) 88.4 - 84.4 %   Platelets 262 150 - 400 K/uL   nRBC 0.0 0.0 - 0.2 %    Comment: Performed at [facility], [address] [place], [place] [address]  Type and screen     Status: None   Collection Time: 05/22/24  9:11 AM  Result Value Ref Range   ABO/RH(D) O POS    Antibody Screen NEG    Sample Expiration 06/05/2024,2359    Extend sample reason      NO TRANSFUSIONS OR PREGNANCY IN THE PAST 3 MONTHS Performed at [facility], [address] [place], [place] [address]   Pregnancy, urine POC     Status: None   Collection Time: 05/23/24  5:52 AM  Result Value Ref Range   Preg Test, Ur NEGATIVE NEGATIVE    Comment:        THE SENSITIVITY OF THIS METHODOLOGY IS >20 mIU/mL.        Assessment and Plan :Maria Ho is a 44 y.o. [id number] here for surgical management of AUB.   Patient desires surgical management with RA-TLH, BS, cysto.  The risks of surgery were discussed in detail with the patient including but not limited to: bleeding which may require transfusion or reoperation; infection which may require prolonged hospitalization or re-hospitalization and antibiotic therapy; injury to bowel, bladder, ureters and major vessels or other surrounding organs which may lead to other procedures; formation of adhesions; need for additional procedures including laparotomy or subsequent procedures secondary to intraoperative injury or abnormal pathology; thromboembolic phenomenon; incisional problems and other postoperative or anesthesia complications.  Patient was told that the likelihood that her condition and symptoms will be treated effectively with this surgical management was high; the postoperative expectations were also discussed in detail. The patient  also understands the alternative treatment options which were discussed in full. All questions were answered.     Anuj Summons, M.D. Minimally Invasive Gynecologic Surgery and Pelvic Pain Specialist Attending Obstetrician & Gynecologist, [facility] for [facility], [facility]     [1]  Allergies Allergen Reactions   Hydrocodone -Acetaminophen  Other (See Comments)    Gets severe H/A/ migraine, can take plain tylenol    "

## 2024-05-23 NOTE — Anesthesia Postprocedure Evaluation (Signed)
"   Anesthesia Post Note  Patient: Maria Ho  Procedure(s) Performed: HYSTERECTOMY, TOTAL, ROBOT-ASSISTED WITH BILATERAL SALPINGECTOMY (Pelvis) CYSTOSCOPY (Bladder)     Patient location during evaluation: PACU Anesthesia Type: General Level of consciousness: awake and alert Pain management: pain level controlled Vital Signs Assessment: post-procedure vital signs reviewed and stable Respiratory status: spontaneous breathing, nonlabored ventilation, respiratory function stable and patient connected to nasal cannula oxygen Cardiovascular status: blood pressure returned to baseline and stable Postop Assessment: no apparent nausea or vomiting Anesthetic complications: no   There were no known notable events for this encounter.  Last Vitals:  Vitals:   05/23/24 1127 05/23/24 1133  BP:    Pulse: 62 (!) 59  Resp: 16 15  Temp:    SpO2: 95% 96%    Last Pain:  Vitals:   05/23/24 1117  TempSrc:   PainSc: 5                  [name]      "

## 2024-05-23 NOTE — Discharge Instructions (Signed)
 Post Op Hysterectomy Instructions Please read the instructions below. Refer to these instructions for the next few weeks. These instructions provide you with general information on caring for yourself after surgery. Your caregiver may also give you specific instructions. While your treatment has been planned according to the most current medical practices available, unavoidable problems sometimes happen. If you have any problems or questions after you leave, please call your caregiver.  HOME CARE INSTRUCTIONS Healing will take time. You will have discomfort, tenderness, swelling and bruising at the operative site for a couple of weeks. This is normal and will get better as time goes on.  Only take over-the-counter or prescription medicines for pain, discomfort or fever as directed by your caregiver.  Do not take aspirin. It can cause bleeding.  Do not drive when taking pain medication.  Follow your caregiver's advice regarding diet, exercise, lifting, driving and general activities.  Resume your usual diet as directed and allowed.  Get plenty of rest and sleep.  Do not douche, use tampons, or have sexual intercourse until your caregiver gives you permission. .  Take your temperature if you feel hot or flushed.  You may shower today when you get home.  No tub bath for one week.   Do not drink alcohol until you are not taking any narcotic pain medications.  Try to have someone home with you for a week or two to help with the household activities.   Be careful over the next two to three weeks with any activities at home that involve lifting, pushing, or pulling.  Listen to your body--if something feels uncomfortable to do, then don't do it. Make sure you and your family understands everything about your operation and recovery.  Walking up stairs is fine. Do not sign any legal documents until you feel normal again.  Keep all your follow-up appointments as recommended by your caregiver.   PLEASE CALL  THE OFFICE IF: There is swelling, redness or increasing pain in the wound area.  Pus is coming from the wound.  You notice a bad smell from the wound or surgical dressing.  You have pain, redness and swelling from the intravenous site.  The wound is breaking open (the edges are not staying together).   You develop pain or bleeding when you urinate.  You develop abnormal vaginal discharge.  You have any type of abnormal reaction or develop an allergy to your medication.  You need stronger pain medication for your pain   SEEK IMMEDIATE MEDICAL CARE: You develop a temperature of 100.5 or higher.  You develop abdominal pain.  You develop chest pain.  You develop shortness of breath.  You pass out.  You develop pain, swelling or redness of your leg.  You develop heavy vaginal bleeding with or without blood clots.   MEDICATIONS: Restart your regular medications BUT wait one week before restarting all vitamins and mineral supplements Use Motrin 800mg  every 8 hours for the next several days.   Take Tylenol 1000mg  every 8 hours for the next several days Use oxycodone 5 mg every 4-6 hours. Taking motrin and tylenol should help reduce how often you use oxycodone.  You may use an over the counter stool softener like Colace or Dulcolax to help with starting a bowel movement.  You can also use miralax (polyethylene glycol). Start the day after you go home.  Warm liquids, fluids, and ambulation help too.  If you have not had a bowel movement in four days, you need to  call the office.

## 2024-05-24 ENCOUNTER — Encounter (HOSPITAL_COMMUNITY): Payer: Self-pay | Admitting: Obstetrics and Gynecology

## 2024-05-27 ENCOUNTER — Ambulatory Visit: Payer: Self-pay | Admitting: Obstetrics and Gynecology

## 2024-05-27 LAB — SURGICAL PATHOLOGY

## 2024-05-27 NOTE — Discharge Summary (Signed)
 Gynecology Physician Postoperative Discharge Summary  Patient ID: Maria Ho MRN: [id number] DOB/AGE: June 08, 1980 44 y.o.  Admit Date: 05/23/2024 Discharge Date: 05/27/2024  Preoperative Diagnoses: abnormal uterine bleeding, fibroids  Procedures: Procedures (LRB): HYSTERECTOMY, TOTAL, ROBOT-ASSISTED WITH BILATERAL SALPINGECTOMY (N/A) CYSTOSCOPY (N/A)  Hospital Course:  Maria Ho is a 44 y.o. [id number]  admitted for scheduled surgery.  She underwent the procedures as mentioned above, her operation was uncomplicated. For further details about surgery, please refer to the operative report. Patient had an uncomplicated postoperative course. By time of discharge on POD#0, her pain was controlled on oral pain medications; she was ambulating, voiding without difficulty, tolerating regular diet and passing flatus. She was deemed stable for discharge to home.   Significant Labs:    Latest Ref Rng & Units 05/22/2024    9:03 AM 01/07/2024    8:48 PM 12/23/2023    7:28 AM  CBC  WBC 4.0 - 10.5 K/uL 5.5  9.3  7.1   Hemoglobin 12.0 - 15.0 g/dL 89.0  88.5  89.1   Hematocrit 36.0 - 46.0 % 36.0  36.8  34.0   Platelets 150 - 400 K/uL 262  306  187     Discharge Exam: Blood pressure (!) 140/83, pulse 81, temperature 97.8 F (36.6 C), temperature source Oral, resp. rate 17, height 5' 9 (1.753 m), weight 102.1 kg, last menstrual period 05/16/2024, SpO2 99%. General appearance: alert and no distress  Resp: clear to auscultation bilaterally  Cardio: regular rate and rhythm  GI: soft, non-tender; bowel sounds normal; no masses, no organomegaly.  Incision: C/D/I, no erythema, no drainage noted Pelvic: scant blood on pad (done in presence of RN as chaperone)  Extremities: extremities normal, atraumatic, no cyanosis or edema and Homans sign is negative, no sign of DVT  Discharged Condition: Stable  Disposition: Discharge disposition: 01-Home or Self Care        Allergies as of  05/23/2024       Reactions   Hydrocodone -acetaminophen  Other (See Comments)   Gets severe H/A/ migraine, can take plain tylenol         Medication List     STOP taking these medications    fluconazole  150 MG tablet Commonly known as: Diflucan        TAKE these medications    Acetaminophen  Extra Strength 500 MG Tabs Take 1 tablet (500 mg total) by mouth every 6 (six) hours as needed.   guanFACINE 1 MG tablet Commonly known as: TENEX Take 1 mg by mouth at bedtime.   ibuprofen  800 MG tablet Commonly known as: ADVIL  Take 1 tablet (800 mg total) by mouth 3 (three) times daily with meals as needed for headache, moderate pain (pain score 4-6) or cramping.   lisdexamfetamine 60 MG capsule Commonly known as: VYVANSE Take 60 mg by mouth every morning.   oxyCODONE  5 MG immediate release tablet Commonly known as: Oxy IR/ROXICODONE  Take 1 tablet (5 mg total) by mouth every 4 (four) hours as needed.   polyethylene glycol powder 17 GM/SCOOP powder Commonly known as: GLYCOLAX /MIRALAX  Take 17 g by mouth daily. Dissolve 1 capful (17g) in 4-8 ounces of liquid and take by mouth daily.   propranolol 20 MG tablet Commonly known as: INDERAL Take 20 mg by mouth 2 (two) times daily.       Future Appointments  Date Time Provider Department Center  06/12/2024  1:30 PM Jeralyn Crutch, MD [facility] None  07/19/2024  9:35 AM Jeralyn Crutch, MD [facility] None     Total  discharge time: 15 minutes   Signed:  [name], MD Minimally Invasive Gynecologic Surgery and Chronic Pelvic Pain Specialist Obstetrics and Gynecology, [facility], [facility]

## 2024-06-12 ENCOUNTER — Encounter: Payer: Self-pay | Admitting: Obstetrics and Gynecology

## 2024-07-19 ENCOUNTER — Encounter: Payer: Self-pay | Admitting: Obstetrics and Gynecology
